# Patient Record
Sex: Male | Born: 1950 | Race: White | Hispanic: No | Marital: Married | Smoking: Never smoker
Health system: Southern US, Community
[De-identification: ages and names within clinical notes are randomized; demographics above are authoritative.]

## PROBLEM LIST (undated history)

## (undated) DIAGNOSIS — E785 Hyperlipidemia, unspecified: Secondary | ICD-10-CM

## (undated) DIAGNOSIS — I10 Essential (primary) hypertension: Secondary | ICD-10-CM

## (undated) DIAGNOSIS — C801 Malignant (primary) neoplasm, unspecified: Secondary | ICD-10-CM

## (undated) HISTORY — PX: HERNIA REPAIR: SHX51

## (undated) HISTORY — PX: CHOLECYSTECTOMY: SHX55

## (undated) HISTORY — PX: PROSTATE SURGERY: SHX751

## (undated) HISTORY — PX: APPENDECTOMY: SHX54

---

## 2008-02-25 ENCOUNTER — Emergency Department (HOSPITAL_COMMUNITY): Admission: EM | Admit: 2008-02-25 | Discharge: 2008-02-25 | Payer: Self-pay | Admitting: Emergency Medicine

## 2009-04-20 IMAGING — CT CT ANGIO CHEST
3 of 7 series · 17 of 46 positions shown · IV contrast (APPLIED)
Comparison: Plain film chest same date.

CHEST:

CLINICAL DATA: Chest pain.  Diaphoresis.  Evaluate for aortic
dissection.

]
CT ANGIOGRAPHY CHEST/ABDOMEN
TECHNIQUE: Multidetector CT imaging of the chest and abdomen were
performed using the standard protocol during bolus administration
of intravenous contrast. Precontrast images of the chest were
performed.  Multiplanar CT image reconstructions were obtained to
evaluate the vascular anatomy.
Contrast: 100 ml [id number]

[Series 4: without 5.0 st · axial · non-contrast · 0.66mm/px · z∈[+923,+1323]mm · 6 of 112 slices shown]
[im 16/112  lung]
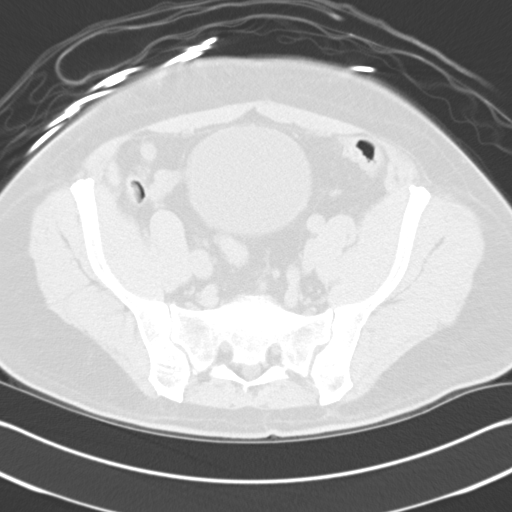
[im 32/112  soft-tissue]
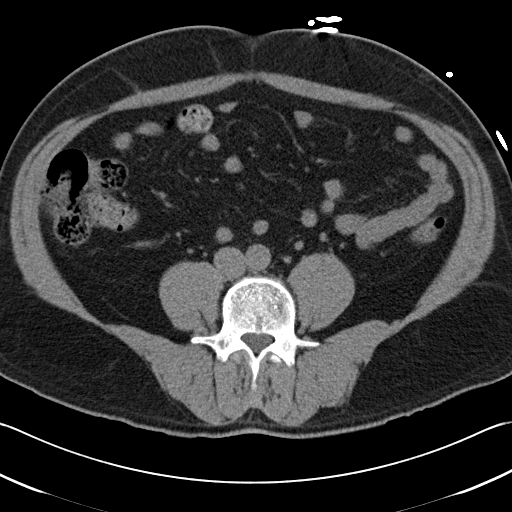
[im 48/112  lung]
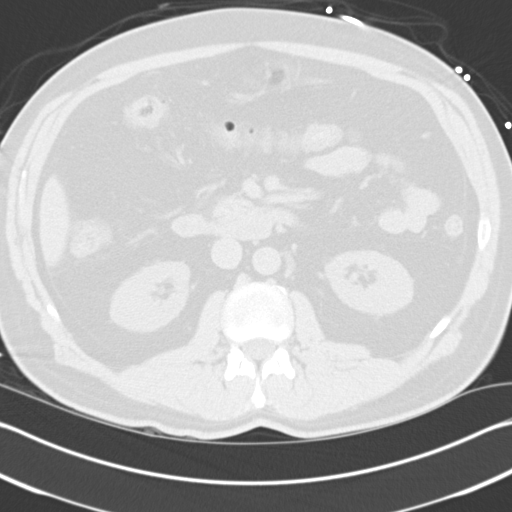
[im 64/112  soft-tissue]
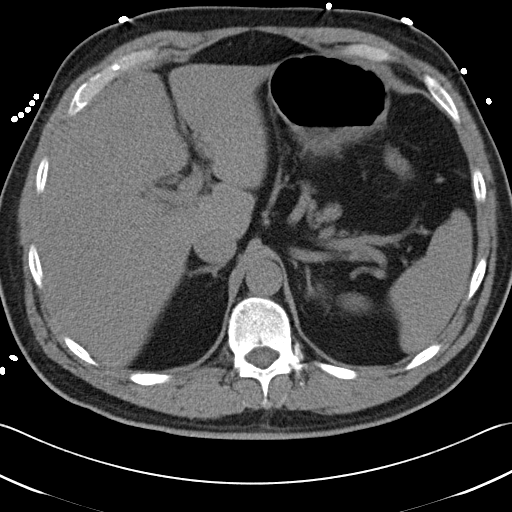
[im 80/112  lung]
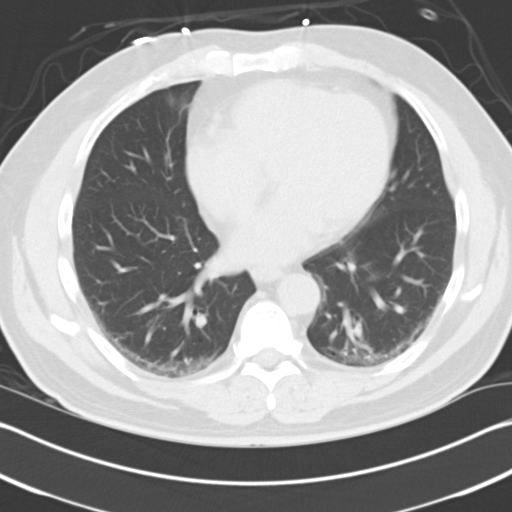
[im 96/112  soft-tissue]
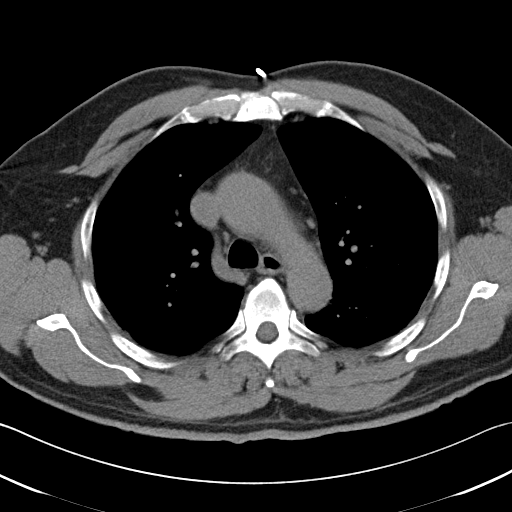

[Series 5: dissection 2.0 st · axial · 0.65mm/px · z∈[+924,+1352]mm · 8 of 246 slices shown]
[im 16/246  lung]
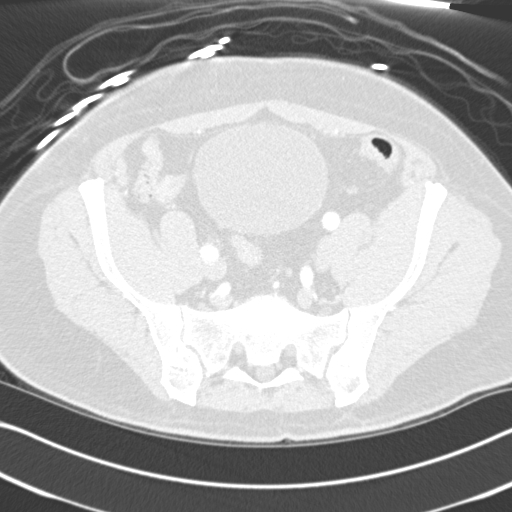
[im 46/246  lung]
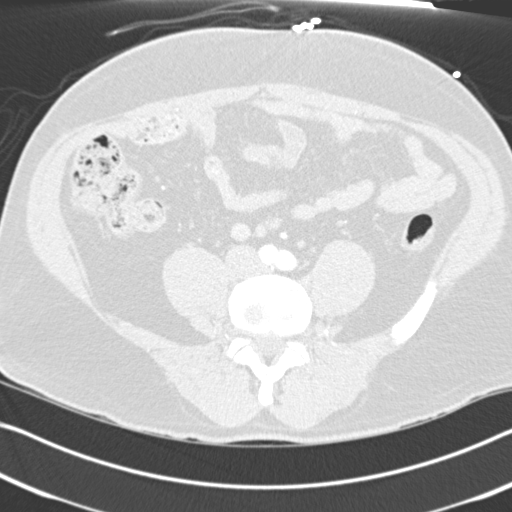
[im 77/246  lung]
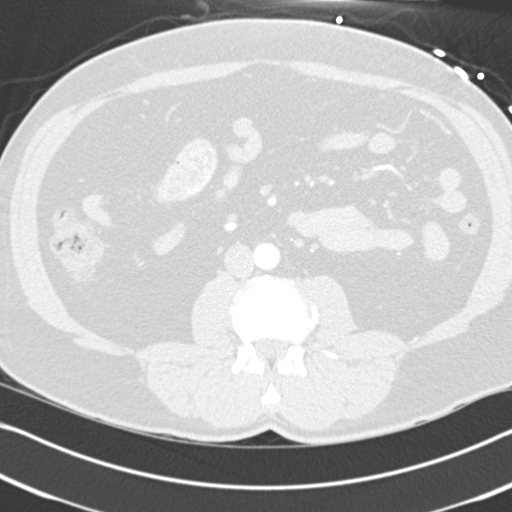
[im 108/246  lung]
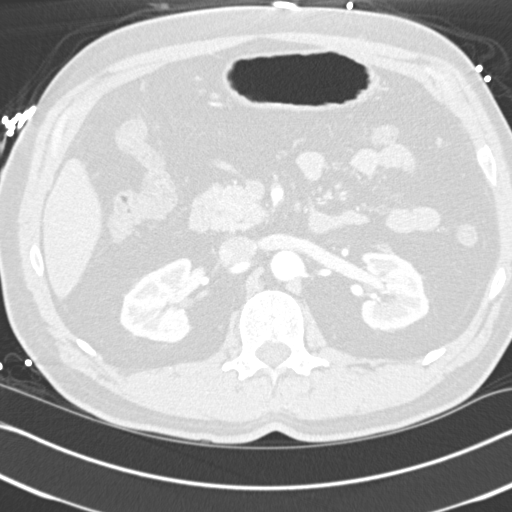
[im 138/246  lung]
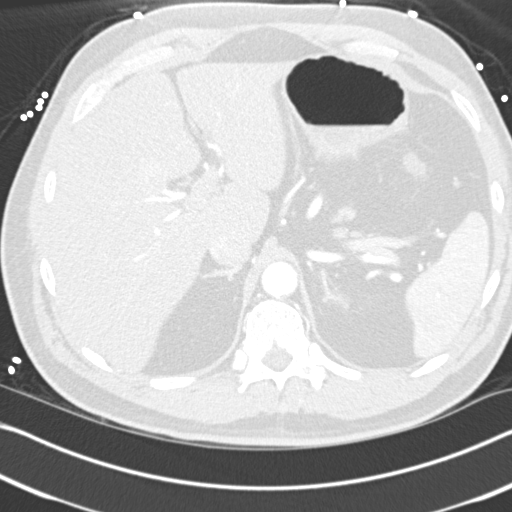
[im 169/246  lung]
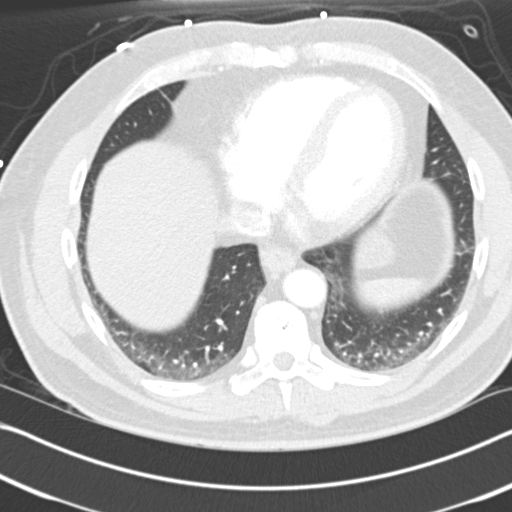
[im 200/246  lung]
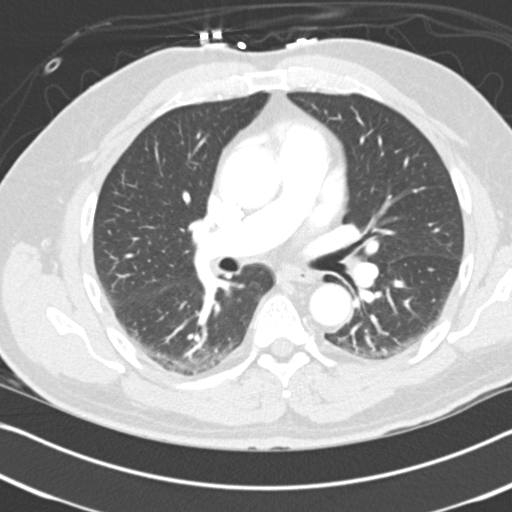
[im 230/246  lung]
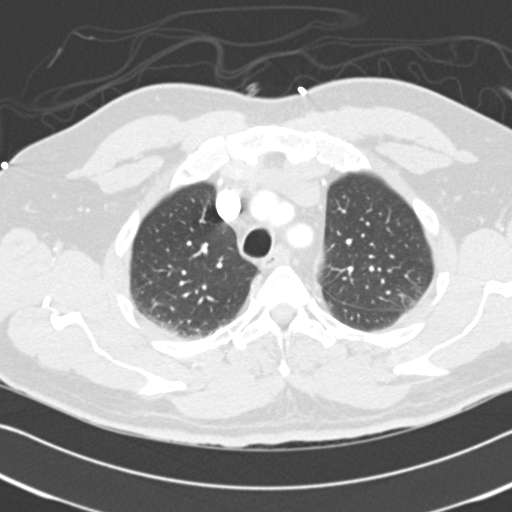

[Series 602: coronal dissection prot · coronal · 0.96mm/px · 3 of 121 slices shown]
[im 31/121  soft-tissue]
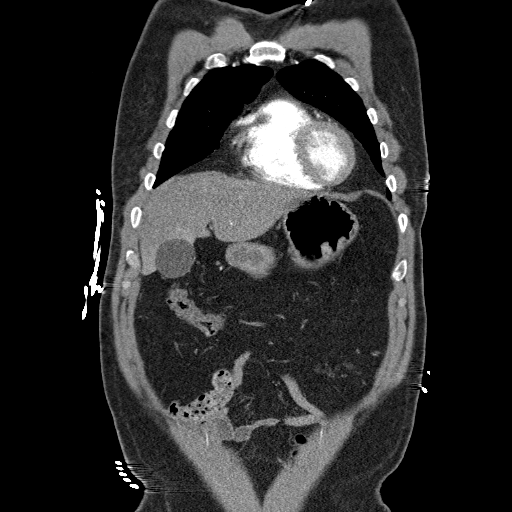
[im 61/121  soft-tissue]
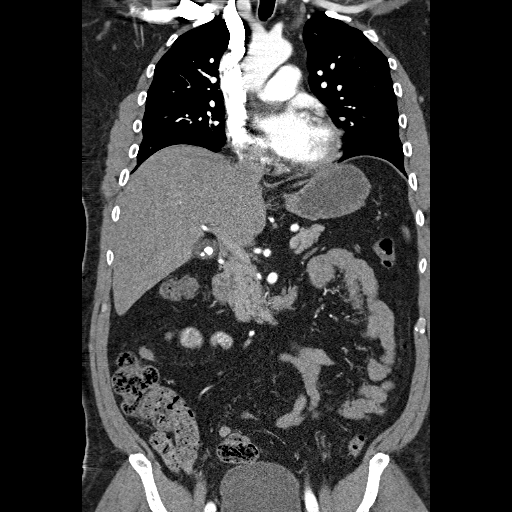
[im 91/121  soft-tissue]
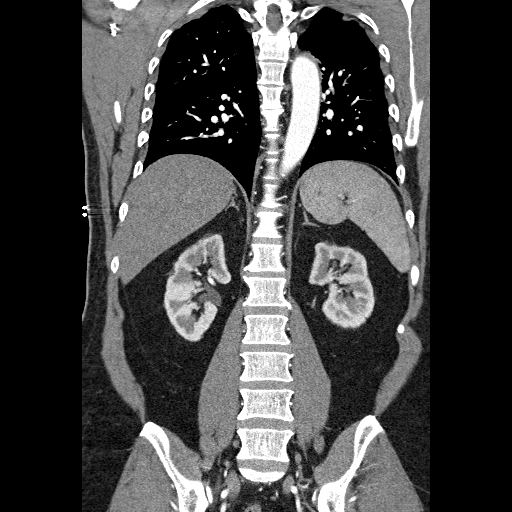

[17 of 46 positions shown; findings below may reference images not displayed]

FINDINGS: Lung windows demonstrate mild motion artifact .
Calcified nodules on the right minor fissure consistent with old
granulomatous disease.  Minimal atelectasis in the right middle
lobe.  Scar or atelectasis within the lingula.  Mild bibasilar
atelectasis.

Soft tissue windows demonstrate No evidence of intramural hematoma
on unenhanced imaging.

Normal aortic caliber without evidence of dissection.  Heart size
upper limits of normal. No pericardial or pleural effusion.  No
large pulmonary embolism on this non dedicated study. No
mediastinal or hilar adenopathy. Tiny hiatal hernia.

]
IMPRESSION: 1.  No evidence of aortic dissection or other explanation for acute
chest pain.
2.  Small hiatal hernia.

ABODMEN:
FINDINGS: [Mild fatty infiltration of the liver.  fat sparing
adjacent to the gallbladder.  Vague areas of hyperenhancement in
the right lobe the liver on images 95 and 106 of series 5.

Normal spleen, stomach, pancreas.  Cholelithiasis.  Largest stone
measures 9 mm.  No evidence of acute cholecystitis or biliary
ductal dilatation. Borderline gallbladder distension at 10 cm.

Normal adrenal glands.  Mildly scarred irregular kidneys
bilaterally.

Normal abdominal aortic caliber.  Widely patent celiac axis and
superior mesenteric artery.  multiple renal arteries bilaterally
all widely patent.  No evidence of aortic or proximal common iliac
dissection or aneurysm.

No retroperitoneal or retrocrural adenopathy. Normal colon and
terminal ileum.  Appendix is not visualized but there is no
evidence of right lower quadrant inflammation.

Normal abdominal small bowel without ascites. Probable right iliac
bone island....]
IMPRESSION: [.
1.  No evidence of aortic aneurysm or dissection.
2.  Cholelithiasis with borderline gallbladder distension.  If
cholecystitis is a concern, consider ultrasound.
3.  Multiple foci of arterial hepatic hyperenhancement.  Most
likely small perfusion anomalies.  If there is a history of liver
disease or primary malignancy, consider non emergent outpatient
abdominal MRI.
4.  Fatty infiltration of the liver..]

## 2010-11-21 NOTE — Consult Note (Signed)
NAMEHOWELL, GROESBECK NO.:  [id number]   MEDICAL RECORD NO.:  [id number]          PATIENT TYPE:  EMS   LOCATION:  [facility]                         FACILITY:  [facility]   PHYSICIAN:  Ardeth Sportsman, MD     DATE OF BIRTH:  [birth year]   DATE OF CONSULTATION:  02/25/2008  DATE OF DISCHARGE:  02/25/2008                                 CONSULTATION   TIME OF CONSULTATION:  1400 p.m.   REQUESTING PHYSICIAN:  Zadie Rhine, MD, in [facility] Emergency  Department.   GASTROENTEROLOGIST:  Dr. Dan Humphreys at [facility].   REASON FOR CONSULTATION:  Epigastric/chest pain with incidental findings  of a 1.2 cm gallstone on CT scan.   HISTORY OF PRESENT ILLNESS:  Mr. Rayos is a 60 year old white male with  a history of hypertension, GERD, and acid reflux who states that he woke  up this morning at around 0300 a.m. with a sharp, stabbing  chest/epigastric pain.  He states that he went and used the bathroom,  and the pain did not get any better.  Therefore, at that time, he took  his morning medications including his Prilosec.  This also was of no  help, and the patient tried to work through this, and therefore, went  onto work.  By the time the patient got to work, the pain was so bad  that 911 was called and the patient was brought here to [facility] Emergency  Room.  Once here, the patient was hypertensive, sweating, and appeared  in great distress.  At this time, an EKG, as well as cardiac enzymes and  a CT angio of the chest and abdomen were completed.  All these were  essentially negative.  However, at the time of the CT angio, there was  an incidental finding of a 1.2 cm gallstone.  Therefore, an ultrasound  of the abdomen and pelvis was done to further assess this.  The  abdominal ultrasound showed no gallbladder wall thickening or  pericholecystic fluid with a negative sonographic Murphy sign.  The only  other finding was 1.2 cm gallstone.  Because of this gallstone, we  were  called to see the patient.   All of the patient's labs are normal.  His pancreatic enzymes were  normal with a lipase of 29.  All of his LFTs were normal with a normal  CMP as well as a normal CBC with a normal white count of 8600.  Upon  evaluation, the patient does tell us that he has a history of acid  reflux.  He states that back in 2005, he had an endoscopy as well as  a  colonoscopy by Dr. Dan Humphreys with [facility].  After  further questioning, he did admit to the fact that while he was having  his endoscopy, apparently, he had some esophageal dilatation done at  that time as well.  Currently, the patient states that on occasions, he  feels that he gets food stuck; however, this has not happened very  often.  Otherwise, he states that his pain today is not associated at  all  with food.  There was no help with lying down or sitting up.  He did  not have any nausea or vomiting or diarrhea with this episode.  However,  he does state that this pain was sharp like a knife stabbing him in the  chest and did radiate towards his straight back to his spinal cord.  Otherwise, the patient currently denies any fevers, and his pain has  currently somewhat subsided since the time of admission to the ER.   REVIEW OF SYSTEMS:  Please see HPI.  Otherwise, all other systems are  essentially negative.   FAMILY HISTORY:  Noncontributory.   PAST MEDICAL HISTORY:  1. GERD.  2. Acid reflux.  3. Gout.  4. Hypercholesterolemia.  5. Hypertension.   PAST SURGICAL HISTORY:  There is no past surgical history.   SOCIAL HISTORY:  The patient is married.  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  Otherwise, he denies any use of alcohol,  tobacco, or any other drugs.   ALLERGIES:  No known drug allergies.   MEDICATIONS:  1. Aspirin 81 mg once a day.  2. Hydrochlorothiazide 25 mg once a day.  3. Indocin 50 mg as needed.  4. Levitra 10 mg as needed.  5. Metoprolol  tartrate 100 mg twice a day.  6. Multivitamins 1 tab once a day.  7. Prilosec 20 mg once a day.  8. TriCor 48 mg once a day.  9. Benicar 20 mg once a day.   PHYSICAL EXAM:  GENERAL:  Mr. Heinz is a 60 year old white male who is  lying in bed, currently in no acute distress and does not appear ill.  VITAL SIGNS:  Temperature 97.4, pulse 68, respirations 18, blood  pressure 156/90, however, has gotten as high as 181/109.  EYES:  Sclerae noninjected.  Pupils were equal, round, and reactive to  light.  EARS, NOSE, MOUTH, AND THROAT:  Ears and nose with no obvious masses or  lesions.  No rhinorrhea.  Mouth is pink and moist.  Throat shows no  exudate.  NECK:  Supple.  Trachea is midline.  No thyromegaly.  HEART:  Regular rate and rhythm.  Normal S1 and S2.  No murmurs,  gallops, or rubs are noted.  Positive carotid, radial, and pedal pulses  bilaterally.  LUNGS:  Clear to auscultation bilaterally with no wheezes, rhonchi, or  rales noted.  Respiratory effort is nonlabored.  Chest is symmetrical  with some tenderness to palpation just inferior to the xiphoid process.  ABDOMEN:  Soft with mild epigastric tenderness.  The patient does have  active bowel sounds.  Otherwise, he is nondistended with no other  obvious masses or hernias.  The patient is only tender in his epigastric  region and has no tenderness at all in his right upper quadrant or his  left upper quadrant.  He also has a negative Murphy sign.  GU:  Per Dr. Michaell Cowing' evaluation, the patient has a subtle left inguinal  hernia.  MUSCULOSKELETAL:  All 4 extremities are symmetrical with no cyanosis,  clubbing, or edema.  SKIN:  Warm and dry with no obvious masses, lesions, or rashes.  NEUROLOGIC:  The patient's cranial nerves II through XII appeared to be  grossly intact.  Deep tendon reflex exam was deferred at this time.  PSYCH:  The patient is alert and oriented x3 with somewhat slow sense of  speech.  However, he does have a  normal affect.   LABS AND DIAGNOSTICS:  Sodium 138, potassium 4.7, BUN 20, creatinine  1.42, glucose 153.  WBCs 8600, hemoglobin 16.3, hematocrit 48.0, and  platelets were 198,000.  CT angio of the chest and abdomen was negative  except for an incidental finding of a 1.2 cm gallstone.  Abdominal  ultrasound was also negative except for once again this 1.2 cm  gallstone.  Otherwise, he had no gallbladder wall thickening.  No  pericholecystic fluid, and no common bile duct dilations or  abnormalities.   IMPRESSION:  1. Epigastric/chest pain, etiology is unclear.  2. Gastroesophageal reflux disease.  3. Acid reflux.  4. Hypertension.   PLAN:  At this time, the patient does not seem to have traditional  gallbladder type symptoms.  He is not complaining of pain after eating,  any nausea or vomiting, or even any right upper quadrant abdominal pain.  This is somewhat unclear presentation, however, due to his history of  acid reflux and GERD, as well as a prior esophageal dilatation and  possible gastritis seen on EGD done by Dr. Dan Humphreys in 2005, we feel at  this time, the patient probably needs to follow up again with Dr. Dan Humphreys  or a GI specialist of his choice to once again be scoped from above.  Otherwise, in the meantime, we have informed the patient to go ahead and  double up on his Prilosec and take 20 mg twice a day.   We have also talked to the patient to let him know that if for some  reason he continues to have problems or after further workup by GI  specialist is done, if he does need a Careers adviser, to feel free to call  [facility] and see Dr. Michaell Cowing in followup.  Otherwise,  there is a further recommendation if EGD comes back negative that it is  possible the patient may need a HIDA scan with [facility] to further evaluate  the gallbladder as well.  Otherwise, at this time, Dr. Michaell Cowing and I have  discussed this case with the ER physician and feel that the patient is  stable  for discharge from the emergency room with followup with a GI  doctor.  Also, we have informed the patient if for some reasons his left  inguinal hernia ever begins to cause pain or any significant problems,  that he once again may follow up with Dr. Michaell Cowing for this to be fixed at  a later date.      Letha Cape, PA      Ardeth Sportsman, MD  Electronically Signed    KEO/MEDQ  D:  02/25/2008  T:  02/26/2008  Job:  [id number]   cc:   Dr. Dan Humphreys

## 2011-03-22 ENCOUNTER — Encounter (INDEPENDENT_AMBULATORY_CARE_PROVIDER_SITE_OTHER): Payer: 59 | Admitting: Ophthalmology

## 2011-03-22 DIAGNOSIS — D3130 Benign neoplasm of unspecified choroid: Secondary | ICD-10-CM

## 2011-03-22 DIAGNOSIS — H35039 Hypertensive retinopathy, unspecified eye: Secondary | ICD-10-CM

## 2011-03-22 DIAGNOSIS — H2510 Age-related nuclear cataract, unspecified eye: Secondary | ICD-10-CM

## 2011-03-22 DIAGNOSIS — H43819 Vitreous degeneration, unspecified eye: Secondary | ICD-10-CM

## 2012-03-21 ENCOUNTER — Encounter (INDEPENDENT_AMBULATORY_CARE_PROVIDER_SITE_OTHER): Payer: 59 | Admitting: Ophthalmology

## 2012-03-21 DIAGNOSIS — H43819 Vitreous degeneration, unspecified eye: Secondary | ICD-10-CM

## 2012-03-21 DIAGNOSIS — D3130 Benign neoplasm of unspecified choroid: Secondary | ICD-10-CM

## 2012-03-21 DIAGNOSIS — I10 Essential (primary) hypertension: Secondary | ICD-10-CM

## 2012-03-21 DIAGNOSIS — H35039 Hypertensive retinopathy, unspecified eye: Secondary | ICD-10-CM

## 2012-03-21 DIAGNOSIS — H2510 Age-related nuclear cataract, unspecified eye: Secondary | ICD-10-CM

## 2013-03-27 ENCOUNTER — Ambulatory Visit (INDEPENDENT_AMBULATORY_CARE_PROVIDER_SITE_OTHER): Payer: 59 | Admitting: Ophthalmology

## 2013-04-03 ENCOUNTER — Ambulatory Visit (INDEPENDENT_AMBULATORY_CARE_PROVIDER_SITE_OTHER): Payer: TRICARE For Life (TFL) | Admitting: Ophthalmology

## 2013-04-03 DIAGNOSIS — I10 Essential (primary) hypertension: Secondary | ICD-10-CM

## 2013-04-03 DIAGNOSIS — H2510 Age-related nuclear cataract, unspecified eye: Secondary | ICD-10-CM

## 2013-04-03 DIAGNOSIS — H43819 Vitreous degeneration, unspecified eye: Secondary | ICD-10-CM

## 2013-04-03 DIAGNOSIS — H35039 Hypertensive retinopathy, unspecified eye: Secondary | ICD-10-CM

## 2013-04-03 DIAGNOSIS — D3130 Benign neoplasm of unspecified choroid: Secondary | ICD-10-CM

## 2014-04-08 ENCOUNTER — Ambulatory Visit (INDEPENDENT_AMBULATORY_CARE_PROVIDER_SITE_OTHER): Payer: 59 | Admitting: Ophthalmology

## 2014-04-08 DIAGNOSIS — D3132 Benign neoplasm of left choroid: Secondary | ICD-10-CM

## 2014-04-08 DIAGNOSIS — H43813 Vitreous degeneration, bilateral: Secondary | ICD-10-CM

## 2014-04-08 DIAGNOSIS — I10 Essential (primary) hypertension: Secondary | ICD-10-CM

## 2014-04-08 DIAGNOSIS — H2513 Age-related nuclear cataract, bilateral: Secondary | ICD-10-CM

## 2014-04-08 DIAGNOSIS — H35033 Hypertensive retinopathy, bilateral: Secondary | ICD-10-CM

## 2014-06-24 ENCOUNTER — Emergency Department (INDEPENDENT_AMBULATORY_CARE_PROVIDER_SITE_OTHER)
Admission: EM | Admit: 2014-06-24 | Discharge: 2014-06-24 | Disposition: A | Discharge status: Home / Self Care | Payer: 59 | Source: Home / Self Care | Attending: Family Medicine | Admitting: Family Medicine

## 2014-06-24 ENCOUNTER — Encounter (HOSPITAL_COMMUNITY): Payer: Self-pay | Admitting: Emergency Medicine

## 2014-06-24 DIAGNOSIS — H01006 Unspecified blepharitis left eye, unspecified eyelid: Secondary | ICD-10-CM

## 2014-06-24 HISTORY — DX: Hyperlipidemia, unspecified: E78.5

## 2014-06-24 HISTORY — DX: Malignant (primary) neoplasm, unspecified: C80.1

## 2014-06-24 HISTORY — DX: Essential (primary) hypertension: I10

## 2014-06-24 MED ORDER — ERYTHROMYCIN 5 MG/GM OP OINT: 1.0000 "application " | TOPICAL_OINTMENT | Freq: Every day | OPHTHALMIC | Status: DC

## 2014-06-24 NOTE — ED Provider Notes (Signed)
CSN: [id number]     Arrival date & time 06/24/14  1008 History   First MD Initiated Contact with Patient 06/24/14 1059     Chief Complaint  Patient presents with  . Conjunctivitis   (Consider location/radiation/quality/duration/timing/severity/associated sxs/prior Treatment) HPI  L eyelid swelling and eye irritation: started yesterday. Getting worse. Film over eye will affect vision. Denies recent cold symptoms. Denies fevers, lossof vision, HA, rash, n/v, CP, SOB. Has not tried anything. Does not wear contacts. 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    Past Medical History  Diagnosis Date  . Hypertension   . Cancer     prostate  . HLD (hyperlipidemia)    Past Surgical History  Procedure Laterality Date  . Appendectomy    . Hernia repair    . Cholecystectomy    . Prostate surgery      prostate cancer 2014   History reviewed. No pertinent family history. History  Substance Use Topics  . Smoking status: Never Smoker   . Smokeless tobacco: Not on file  . Alcohol Use: No    Review of Systems Per HPI with all other pertinent systems negative.   Allergies  Review of patient's allergies indicates no known allergies.  Home Medications   Prior to Admission medications   Medication Sig Start Date End Date Taking? Authorizing Provider  amLODipine (NORVASC) 10 MG tablet Take 10 mg by mouth daily.   Yes Historical Provider, MD  fenofibrate (TRICOR) 48 MG tablet Take 48 mg by mouth daily.   Yes Historical Provider, MD  finasteride (PROSCAR) 5 MG tablet Take 5 mg by mouth daily.   Yes Historical Provider, MD  metoprolol succinate (TOPROL-XL) 50 MG 24 hr tablet Take 50 mg by mouth daily. Take with or immediately following a meal.   Yes Historical Provider, MD  olmesartan-hydrochlorothiazide (BENICAR HCT) 40-25 MG per tablet Take 1 tablet by mouth daily.   Yes Historical Provider, MD  omeprazole (PRILOSEC) 20 MG capsule Take 20 mg by mouth daily.   Yes Historical  Provider, MD  pravastatin (PRAVACHOL) 20 MG tablet Take 20 mg by mouth daily.   Yes Historical Provider, MD  tadalafil (CIALIS) 5 MG tablet Take 5 mg by mouth daily as needed for erectile dysfunction.   Yes Historical Provider, MD  tamsulosin (FLOMAX) 0.4 MG CAPS capsule Take 0.4 mg by mouth.   Yes Historical Provider, MD  erythromycin ophthalmic ointment Place 1 application into the left eye at bedtime. Treat for 5 days 06/24/14   Waldemar Dickens, MD  sildenafil (VIAGRA) 100 MG tablet Take 100 mg by mouth daily as needed for erectile dysfunction.    Historical Provider, MD   BP 138/69 mmHg  Pulse 69  Temp(Src) 98.3 F (36.8 C) (Oral)  Resp 12  SpO2 98% Physical Exam  Constitutional: He is oriented to person, place, and time. He appears well-developed and well-nourished.  HENT:  Head: Normocephalic and atraumatic.  L superior eyelid w/ erythema but no mass/lesion. Conjunctival injection and scant dried discharge.   Eyes: EOM are normal. Pupils are equal, round, and reactive to light.  Neck: Normal range of motion.  Cardiovascular:  No murmur heard. Pulmonary/Chest: Effort normal. No respiratory distress.  Abdominal: Bowel sounds are normal.  Musculoskeletal: Normal range of motion. He exhibits no edema or tenderness.  Neurological: He is alert and oriented to person, place, and time.  Skin: Skin is warm.  Psychiatric: He has a normal mood and affect. His behavior is normal. Judgment  and thought content normal.    ED Course  Procedures (including critical care time) Labs Review Labs Reviewed - No data to display  Imaging Review No results found.   MDM   1. Blepharitis, left    Warm soapy water Warm compresses Erythromycin ointment if not improving Precautions given and all questions answered  Linna Darner, MD Family Medicine 06/24/2014, 11:18 AM      Waldemar Dickens, MD 06/24/14 [phone]

## 2014-06-24 NOTE — Discharge Instructions (Signed)
You likely have blepharitis or inflammation of the upper eyelid This is likely caused by a bacterial infection and may clear with daily soapy washes and warm compresses. Please start the erythromycin if this doesn't improve in another 24 hours   Blepharitis Blepharitis is redness, soreness, and swelling (inflammation) of one or both eyelids. It may be caused by an allergic reaction or a bacterial infection. Blepharitis may also be associated with reddened, scaly skin (seborrhea) of the scalp and eyebrows. While you sleep, eye discharge may cause your eyelashes to stick together. Your eyelids may itch, burn, swell, and may lose their lashes. These will grow back. Your eyes may become sensitive. Blepharitis may recur and need repeated treatment. If this is the case, you may require further evaluation by an eye specialist (ophthalmologist). HOME CARE INSTRUCTIONS   Keep your hands clean.  Use a clean towel each time you dry your eyelids. Do not use this towel to clean other areas. Do not share a towel or makeup with anyone.  Wash your eyelids with warm water or warm water mixed with a small amount of baby shampoo. Do this twice a day or as often as needed.  Wash your face and eyebrows at least once a day.  Use warm compresses 2 times a day for 10 minutes at a time, or as directed by your caregiver.  Apply antibiotic ointment as directed by your caregiver.  Avoid rubbing your eyes.  Avoid wearing makeup until you get better.  Follow up with your caregiver as directed. SEEK IMMEDIATE MEDICAL CARE IF:   You have pain, redness, or swelling that gets worse or spreads to other parts of your face.  Your vision changes, or you have pain when looking at lights or moving objects.  You have a fever.  Your symptoms continue for longer than 2 to 4 days or become worse. MAKE SURE YOU:   Understand these instructions.  Will watch your condition.  Will get help right away if you are not doing  well or get worse. Document Released: 06/22/2000 Document Revised: 09/17/2011 Document Reviewed: 08/02/2010 [facility] Patient Information 2015 [place], [place]. This information is not intended to replace advice given to you by your health care provider. Make sure you discuss any questions you have with your health care provider.

## 2014-06-24 NOTE — ED Notes (Signed)
C/o pain and irritation, mild swelling of the left eye since yesterday.  Denies fever, v/n/d

## 2015-04-12 ENCOUNTER — Ambulatory Visit (INDEPENDENT_AMBULATORY_CARE_PROVIDER_SITE_OTHER): Payer: Medicare Other | Admitting: Ophthalmology

## 2015-04-12 DIAGNOSIS — I10 Essential (primary) hypertension: Secondary | ICD-10-CM

## 2015-04-12 DIAGNOSIS — H35033 Hypertensive retinopathy, bilateral: Secondary | ICD-10-CM | POA: Diagnosis not present

## 2015-04-12 DIAGNOSIS — H2513 Age-related nuclear cataract, bilateral: Secondary | ICD-10-CM

## 2015-04-12 DIAGNOSIS — H43813 Vitreous degeneration, bilateral: Secondary | ICD-10-CM | POA: Diagnosis not present

## 2015-04-12 DIAGNOSIS — D3132 Benign neoplasm of left choroid: Secondary | ICD-10-CM | POA: Diagnosis not present

## 2016-04-11 ENCOUNTER — Ambulatory Visit (INDEPENDENT_AMBULATORY_CARE_PROVIDER_SITE_OTHER): Payer: Medicare Other | Admitting: Ophthalmology

## 2016-04-11 DIAGNOSIS — H43813 Vitreous degeneration, bilateral: Secondary | ICD-10-CM | POA: Diagnosis not present

## 2016-04-11 DIAGNOSIS — D3132 Benign neoplasm of left choroid: Secondary | ICD-10-CM

## 2016-04-11 DIAGNOSIS — I10 Essential (primary) hypertension: Secondary | ICD-10-CM

## 2016-04-11 DIAGNOSIS — H35033 Hypertensive retinopathy, bilateral: Secondary | ICD-10-CM | POA: Diagnosis not present

## 2017-04-12 ENCOUNTER — Ambulatory Visit (INDEPENDENT_AMBULATORY_CARE_PROVIDER_SITE_OTHER): Payer: Medicare Other | Admitting: Ophthalmology

## 2017-04-12 DIAGNOSIS — D3132 Benign neoplasm of left choroid: Secondary | ICD-10-CM

## 2017-04-12 DIAGNOSIS — H2513 Age-related nuclear cataract, bilateral: Secondary | ICD-10-CM

## 2017-04-12 DIAGNOSIS — H43813 Vitreous degeneration, bilateral: Secondary | ICD-10-CM

## 2017-04-12 DIAGNOSIS — I10 Essential (primary) hypertension: Secondary | ICD-10-CM

## 2017-04-12 DIAGNOSIS — H35033 Hypertensive retinopathy, bilateral: Secondary | ICD-10-CM

## 2017-04-12 DIAGNOSIS — E113292 Type 2 diabetes mellitus with mild nonproliferative diabetic retinopathy without macular edema, left eye: Secondary | ICD-10-CM | POA: Diagnosis not present

## 2017-04-12 DIAGNOSIS — E11319 Type 2 diabetes mellitus with unspecified diabetic retinopathy without macular edema: Secondary | ICD-10-CM | POA: Diagnosis not present

## 2017-07-07 ENCOUNTER — Other Ambulatory Visit: Payer: Self-pay | Admitting: Family Medicine

## 2018-04-14 ENCOUNTER — Encounter (INDEPENDENT_AMBULATORY_CARE_PROVIDER_SITE_OTHER): Payer: Medicare Other | Admitting: Ophthalmology

## 2018-04-14 DIAGNOSIS — H43813 Vitreous degeneration, bilateral: Secondary | ICD-10-CM

## 2018-04-14 DIAGNOSIS — I10 Essential (primary) hypertension: Secondary | ICD-10-CM | POA: Diagnosis not present

## 2018-04-14 DIAGNOSIS — E11319 Type 2 diabetes mellitus with unspecified diabetic retinopathy without macular edema: Secondary | ICD-10-CM | POA: Diagnosis not present

## 2018-04-14 DIAGNOSIS — D3132 Benign neoplasm of left choroid: Secondary | ICD-10-CM | POA: Diagnosis not present

## 2018-04-14 DIAGNOSIS — H2513 Age-related nuclear cataract, bilateral: Secondary | ICD-10-CM

## 2018-04-14 DIAGNOSIS — H35033 Hypertensive retinopathy, bilateral: Secondary | ICD-10-CM | POA: Diagnosis not present

## 2018-04-14 DIAGNOSIS — E113292 Type 2 diabetes mellitus with mild nonproliferative diabetic retinopathy without macular edema, left eye: Secondary | ICD-10-CM | POA: Diagnosis not present

## 2019-04-15 ENCOUNTER — Encounter (INDEPENDENT_AMBULATORY_CARE_PROVIDER_SITE_OTHER): Payer: Medicare Other | Admitting: Ophthalmology

## 2019-04-15 DIAGNOSIS — I10 Essential (primary) hypertension: Secondary | ICD-10-CM

## 2019-04-15 DIAGNOSIS — H34832 Tributary (branch) retinal vein occlusion, left eye, with macular edema: Secondary | ICD-10-CM | POA: Diagnosis not present

## 2019-04-15 DIAGNOSIS — E113291 Type 2 diabetes mellitus with mild nonproliferative diabetic retinopathy without macular edema, right eye: Secondary | ICD-10-CM | POA: Diagnosis not present

## 2019-04-15 DIAGNOSIS — H43813 Vitreous degeneration, bilateral: Secondary | ICD-10-CM

## 2019-04-15 DIAGNOSIS — E11319 Type 2 diabetes mellitus with unspecified diabetic retinopathy without macular edema: Secondary | ICD-10-CM | POA: Diagnosis not present

## 2019-04-15 DIAGNOSIS — E113392 Type 2 diabetes mellitus with moderate nonproliferative diabetic retinopathy without macular edema, left eye: Secondary | ICD-10-CM

## 2019-04-15 DIAGNOSIS — H35033 Hypertensive retinopathy, bilateral: Secondary | ICD-10-CM

## 2020-12-23 ENCOUNTER — Other Ambulatory Visit: Payer: Self-pay

## 2020-12-23 ENCOUNTER — Ambulatory Visit (HOSPITAL_COMMUNITY)
Admission: EM | Admit: 2020-12-23 | Discharge: 2020-12-23 | Disposition: A | Discharge status: Home / Self Care | Payer: Medicare Other | Attending: Family | Admitting: Family

## 2020-12-23 ENCOUNTER — Encounter (HOSPITAL_COMMUNITY): Payer: Self-pay

## 2020-12-23 DIAGNOSIS — K219 Gastro-esophageal reflux disease without esophagitis: Secondary | ICD-10-CM | POA: Diagnosis not present

## 2020-12-23 DIAGNOSIS — I10 Essential (primary) hypertension: Secondary | ICD-10-CM | POA: Diagnosis not present

## 2020-12-23 DIAGNOSIS — R059 Cough, unspecified: Secondary | ICD-10-CM | POA: Diagnosis not present

## 2020-12-23 DIAGNOSIS — E785 Hyperlipidemia, unspecified: Secondary | ICD-10-CM | POA: Insufficient documentation

## 2020-12-23 DIAGNOSIS — Z79899 Other long term (current) drug therapy: Secondary | ICD-10-CM | POA: Insufficient documentation

## 2020-12-23 DIAGNOSIS — U071 COVID-19: Secondary | ICD-10-CM | POA: Insufficient documentation

## 2020-12-23 DIAGNOSIS — R509 Fever, unspecified: Secondary | ICD-10-CM

## 2020-12-23 DIAGNOSIS — Z8546 Personal history of malignant neoplasm of prostate: Secondary | ICD-10-CM | POA: Diagnosis not present

## 2020-12-23 DIAGNOSIS — R051 Acute cough: Secondary | ICD-10-CM | POA: Diagnosis present

## 2020-12-23 LAB — SARS CORONAVIRUS 2 (TAT 6-24 HRS): SARS Coronavirus 2: POSITIVE — AB

## 2020-12-23 LAB — POC INFLUENZA A AND B ANTIGEN (URGENT CARE ONLY)
INFLUENZA A ANTIGEN, POC: NEGATIVE
INFLUENZA B ANTIGEN, POC: NEGATIVE

## 2020-12-23 MED ORDER — BENZONATATE 100 MG PO CAPS: 100.0000 mg | ORAL_CAPSULE | Freq: Three times a day (TID) | ORAL | 0 refills | Status: DC | PRN

## 2020-12-23 MED ORDER — ACETAMINOPHEN 325 MG PO TABS
650.0000 mg | ORAL_TABLET | Freq: Once | ORAL | Status: AC
  Administered 2020-12-23: 650 mg via ORAL

## 2020-12-23 MED ORDER — ACETAMINOPHEN 325 MG PO TABS
ORAL_TABLET | ORAL | Status: AC
  Filled 2020-12-23: qty 2

## 2020-12-23 NOTE — ED Provider Notes (Signed)
[place]    CSN: [id number] Arrival date & time: 12/23/20  0957      History   Chief Complaint Chief Complaint  Patient presents with   Cough    HPI Jerome Cardenas is a 70 y.o. male.   70 year old male presents with dry cough and fever and chills that started yesterday. Denies any nasal congestion, sore throat or GI symptoms. Mom also sick with dry cough for the past 3 days- recently hospitalized before she was sick. Took home COVID 19 test yesterday that was negative. Has had COVID 19 vaccine and booster. Has not taken any medication for symptoms. Other chronic health issues include HTN, hyperlipidemia, GERD, and history of prostate cancer. Currently on Norvasc, Toprol XL, Benicar HCT, Tricor, Pravachol, Prilosec, Flomax, and Proscar daily.   The history is provided by the patient.   Past Medical History:  Diagnosis Date   Cancer [facility])    prostate   HLD (hyperlipidemia)    Hypertension     There are no problems to display for this patient.   Past Surgical History:  Procedure Laterality Date   APPENDECTOMY     CHOLECYSTECTOMY     HERNIA REPAIR     PROSTATE SURGERY     prostate cancer 2014       Home Medications    Prior to Admission medications   Medication Sig Start Date End Date Taking? Authorizing Provider  amLODipine (NORVASC) 5 MG tablet Take 1 tablet by mouth daily. 12/24/16  Yes [provider]  benzonatate (TESSALON) 100 MG capsule Take 1 capsule (100 mg total) by mouth 3 (three) times daily as needed for cough. 12/23/20  Yes Maricela Schreur, Nicholes Stairs, NP  fenofibrate (TRICOR) 48 MG tablet Take by mouth. 10/04/16  Yes [provider]  finasteride (PROSCAR) 5 MG tablet finasteride 5 mg tablet 04/27/15  Yes [provider]  amLODipine (NORVASC) 10 MG tablet Take 10 mg by mouth daily.    [provider]  Cholecalciferol 25 MCG (1000 UT) capsule Take by mouth.    [provider]  finasteride (PROSCAR) 5 MG  tablet Take 5 mg by mouth daily.    [provider]  metoprolol succinate (TOPROL-XL) 50 MG 24 hr tablet Take 50 mg by mouth daily. Take with or immediately following a meal.    [provider]  olmesartan-hydrochlorothiazide (BENICAR HCT) 40-25 MG per tablet Take 1 tablet by mouth daily.    [provider]  omeprazole (PRILOSEC) 20 MG capsule Take 20 mg by mouth daily.    [provider]  pravastatin (PRAVACHOL) 20 MG tablet Take 20 mg by mouth daily.    [provider]  sildenafil (VIAGRA) 100 MG tablet Take 100 mg by mouth daily as needed for erectile dysfunction.    [provider]  tadalafil (CIALIS) 5 MG tablet Take 5 mg by mouth daily as needed for erectile dysfunction.    [provider]  tamsulosin (FLOMAX) 0.4 MG CAPS capsule Take 0.4 mg by mouth.    [provider]    Family History History reviewed. No pertinent family history.  Social History Social History   Tobacco Use   Smoking status: Never   Smokeless tobacco: Never  Substance Use Topics   Alcohol use: No   Drug use: No     Allergies   Patient has no known allergies.   Review of Systems Review of Systems  Constitutional:  Positive for chills, fatigue and fever. Negative  for activity change and appetite change.  HENT:  Negative for congestion, ear discharge, ear pain, facial swelling, mouth sores, nosebleeds, postnasal drip, rhinorrhea, sinus pressure, sinus pain, sneezing, sore throat and trouble swallowing.   Eyes:  Negative for pain, discharge, redness and itching.  Respiratory:  Positive for cough. Negative for chest tightness, shortness of breath and wheezing.   Cardiovascular:  Negative for chest pain.  Gastrointestinal:  Negative for diarrhea, nausea and vomiting.  Musculoskeletal:  Positive for arthralgias and myalgias. Negative for neck pain and neck stiffness.  Skin:  Negative for color change and rash.  Allergic/Immunologic:  Negative for environmental allergies, food allergies and immunocompromised state.  Neurological:  Negative for dizziness, tremors, seizures, syncope, speech difficulty, weakness, light-headedness, numbness and headaches.  Hematological:  Negative for adenopathy. Does not bruise/bleed easily.    Physical Exam Triage Vital Signs ED Triage Vitals  Enc Vitals Group     BP 12/23/20 1051 (!) 169/82     Pulse Rate 12/23/20 1049 99     Resp 12/23/20 1049 16     Temp 12/23/20 1049 (!) 101.6 F (38.7 C)     Temp Source 12/23/20 1049 Oral     SpO2 12/23/20 1049 97 %     Weight --      Height --      Head Circumference --      Peak Flow --      Pain Score 12/23/20 1047 0     Pain Loc --      Pain Edu? --      Excl. in [place]? --    No data found.  Updated Vital Signs BP (!) 169/82 (BP Location: Right Arm)   Pulse 99   Temp (!) 101.6 F (38.7 C) (Oral)   Resp 16   SpO2 97%   Visual Acuity Right Eye Distance:   Left Eye Distance:   Bilateral Distance:    Right Eye Near:   Left Eye Near:    Bilateral Near:     Physical Exam Vitals and nursing note reviewed.  Constitutional:      General: He is awake. He is not in acute distress.    Appearance: He is well-developed and well-groomed. He is ill-appearing.     Comments: He is sitting on the exam table in no acute distress but appears tired and ill.   HENT:     Head: Normocephalic and atraumatic.     Right Ear: Hearing, tympanic membrane, ear canal and external ear normal.     Left Ear: Hearing, tympanic membrane, ear canal and external ear normal.     Nose: Nose normal. No congestion or rhinorrhea.     Right Sinus: No maxillary sinus tenderness or frontal sinus tenderness.     Left Sinus: No maxillary sinus tenderness or frontal sinus tenderness.     Mouth/Throat:     Lips: Pink.     Mouth: Mucous membranes are moist.     Pharynx: Oropharynx is clear. Uvula midline. No pharyngeal swelling, oropharyngeal exudate, posterior  oropharyngeal erythema or uvula swelling.  Eyes:     Extraocular Movements: Extraocular movements intact.     Conjunctiva/sclera: Conjunctivae normal.  Cardiovascular:     Rate and Rhythm: Regular rhythm. Tachycardia present.     Heart sounds: Normal heart sounds. No murmur heard. Pulmonary:     Effort: Pulmonary effort is normal. No prolonged expiration or respiratory distress.     Breath sounds: Normal breath sounds and air entry. No decreased air  movement. No decreased breath sounds, wheezing, rhonchi or rales.  Musculoskeletal:        General: Normal range of motion.     Cervical back: Normal range of motion and neck supple.  Lymphadenopathy:     Cervical: No cervical adenopathy.  Skin:    General: Skin is warm and dry.     Capillary Refill: Capillary refill takes less than 2 seconds.     Findings: No rash.  Neurological:     General: No focal deficit present.     Mental Status: He is alert and oriented to person, place, and time.  Psychiatric:        Mood and Affect: Mood normal.        Behavior: Behavior normal. Behavior is cooperative.        Thought Content: Thought content normal.        Judgment: Judgment normal.     UC Treatments / Results  Labs (all labs ordered are listed, but only abnormal results are displayed) Labs Reviewed  SARS CORONAVIRUS 2 (TAT 6-24 HRS)  POC INFLUENZA A AND B ANTIGEN (URGENT CARE ONLY)    EKG   Radiology No results found.  Procedures Procedures (including critical care time)  Medications Ordered in UC Medications  acetaminophen (TYLENOL) tablet 650 mg (650 mg Oral Given 12/23/20 1054)    Initial Impression / Assessment and Plan / UC Course  I have reviewed the triage vital signs and the nursing notes.  Pertinent labs & imaging results that were available during my care of the patient were reviewed by me and considered in my medical decision making (see chart for details).     Gave Tylenol 650mg  now to help with fever.  Reviewed negative rapid flu test results with patient. Discussed that he probably has a viral illness, may be COVID 19.  Do not feel imaging is needed at this time. Recommend start Tessalon cough pills 1 every 8 hours as needed. Continue to push fluids to stay well hydrated. May continue Tylenol 1000mg  every 8 hours as needed for fever. Blood pressure may be elevated due to illness. Continue to monitor. Rest. Stay at home. Follow-up pending COVID 19 test results.    Final Clinical Impressions(s) / UC Diagnoses   Final diagnoses:  Cough  Fever, unspecified  Elevated blood pressure reading in office with diagnosis of hypertension     Discharge Instructions      Recommend start Tessalon cough pills 1 every 8 hours as needed. Continue to push fluids to stay well hydrated. May take Tylenol 1000mg  every 8 hours as needed for fever. Rest. Stay at home. Follow-up pending COVID 19 test results.      ED Prescriptions     Medication Sig Dispense Auth. Provider   benzonatate (TESSALON) 100 MG capsule Take 1 capsule (100 mg total) by mouth 3 (three) times daily as needed for cough. 21 capsule Tiffannie Sloss, Nicholes Stairs, NP      PDMP not reviewed this encounter.   Katy Apo, NP 12/23/20 2050

## 2020-12-23 NOTE — Discharge Instructions (Addendum)
Recommend start Tessalon cough pills 1 every 8 hours as needed. Continue to push fluids to stay well hydrated. May take Tylenol 1000mg  every 8 hours as needed for fever. Rest. Stay at home. Follow-up pending COVID 19 test results.

## 2020-12-23 NOTE — ED Triage Notes (Signed)
Pt in with c/o dry cough that started yesterday  Denies any uri sx  Pt has not taken medication for sx

## 2020-12-24 ENCOUNTER — Telehealth (HOSPITAL_COMMUNITY): Payer: Self-pay

## 2021-06-23 ENCOUNTER — Encounter (INDEPENDENT_AMBULATORY_CARE_PROVIDER_SITE_OTHER): Payer: Medicare Other | Admitting: Ophthalmology

## 2021-06-23 ENCOUNTER — Encounter (INDEPENDENT_AMBULATORY_CARE_PROVIDER_SITE_OTHER): Payer: Self-pay

## 2022-02-19 ENCOUNTER — Encounter (INDEPENDENT_AMBULATORY_CARE_PROVIDER_SITE_OTHER): Payer: Medicare Other | Admitting: Ophthalmology

## 2022-02-19 DIAGNOSIS — H35033 Hypertensive retinopathy, bilateral: Secondary | ICD-10-CM

## 2022-02-19 DIAGNOSIS — E113293 Type 2 diabetes mellitus with mild nonproliferative diabetic retinopathy without macular edema, bilateral: Secondary | ICD-10-CM | POA: Diagnosis not present

## 2022-02-19 DIAGNOSIS — H43813 Vitreous degeneration, bilateral: Secondary | ICD-10-CM

## 2022-02-19 DIAGNOSIS — D3132 Benign neoplasm of left choroid: Secondary | ICD-10-CM

## 2022-02-19 DIAGNOSIS — I10 Essential (primary) hypertension: Secondary | ICD-10-CM

## 2022-07-28 ENCOUNTER — Other Ambulatory Visit: Payer: Self-pay

## 2022-07-28 ENCOUNTER — Encounter (HOSPITAL_COMMUNITY): Payer: Self-pay | Admitting: Emergency Medicine

## 2022-07-28 ENCOUNTER — Observation Stay (HOSPITAL_COMMUNITY)
Admission: EM | Admit: 2022-07-28 | Discharge: 2022-07-29 | Disposition: A | Discharge status: Home Health | Payer: Medicare Other | Attending: Family Medicine | Admitting: Family Medicine

## 2022-07-28 ENCOUNTER — Emergency Department (HOSPITAL_COMMUNITY): Payer: Medicare Other

## 2022-07-28 DIAGNOSIS — C61 Malignant neoplasm of prostate: Secondary | ICD-10-CM | POA: Diagnosis not present

## 2022-07-28 DIAGNOSIS — I129 Hypertensive chronic kidney disease with stage 1 through stage 4 chronic kidney disease, or unspecified chronic kidney disease: Secondary | ICD-10-CM | POA: Diagnosis not present

## 2022-07-28 DIAGNOSIS — R260 Ataxic gait: Secondary | ICD-10-CM | POA: Diagnosis not present

## 2022-07-28 DIAGNOSIS — E1169 Type 2 diabetes mellitus with other specified complication: Secondary | ICD-10-CM | POA: Insufficient documentation

## 2022-07-28 DIAGNOSIS — C7951 Secondary malignant neoplasm of bone: Secondary | ICD-10-CM | POA: Diagnosis not present

## 2022-07-28 DIAGNOSIS — R531 Weakness: Secondary | ICD-10-CM | POA: Diagnosis present

## 2022-07-28 DIAGNOSIS — R2689 Other abnormalities of gait and mobility: Secondary | ICD-10-CM | POA: Diagnosis not present

## 2022-07-28 DIAGNOSIS — E1149 Type 2 diabetes mellitus with other diabetic neurological complication: Secondary | ICD-10-CM | POA: Insufficient documentation

## 2022-07-28 DIAGNOSIS — Z79899 Other long term (current) drug therapy: Secondary | ICD-10-CM | POA: Insufficient documentation

## 2022-07-28 DIAGNOSIS — I6381 Other cerebral infarction due to occlusion or stenosis of small artery: Principal | ICD-10-CM | POA: Diagnosis present

## 2022-07-28 DIAGNOSIS — E1122 Type 2 diabetes mellitus with diabetic chronic kidney disease: Secondary | ICD-10-CM | POA: Insufficient documentation

## 2022-07-28 DIAGNOSIS — R2681 Unsteadiness on feet: Secondary | ICD-10-CM | POA: Insufficient documentation

## 2022-07-28 DIAGNOSIS — I152 Hypertension secondary to endocrine disorders: Secondary | ICD-10-CM | POA: Insufficient documentation

## 2022-07-28 DIAGNOSIS — E785 Hyperlipidemia, unspecified: Secondary | ICD-10-CM | POA: Diagnosis not present

## 2022-07-28 DIAGNOSIS — I639 Cerebral infarction, unspecified: Secondary | ICD-10-CM

## 2022-07-28 DIAGNOSIS — N182 Chronic kidney disease, stage 2 (mild): Secondary | ICD-10-CM

## 2022-07-28 HISTORY — DX: Chronic kidney disease, stage 2 (mild): N18.2

## 2022-07-28 LAB — COMPREHENSIVE METABOLIC PANEL
ALT: 15 U/L (ref 0–44)
AST: 21 U/L (ref 15–41)
Albumin: 4.1 g/dL (ref 3.5–5.0)
Alkaline Phosphatase: 86 U/L (ref 38–126)
Anion gap: 12 (ref 5–15)
BUN: 14 mg/dL (ref 8–23)
CO2: 23 mmol/L (ref 22–32)
Calcium: 9.5 mg/dL (ref 8.9–10.3)
Chloride: 100 mmol/L (ref 98–111)
Creatinine, Ser: 0.93 mg/dL (ref 0.61–1.24)
GFR, Estimated: >60 mL/min (ref >60)
Glucose, Bld: 252 mg/dL — ABNORMAL HIGH (ref 70–99)
Potassium: 3.8 mmol/L (ref 3.5–5.1)
Sodium: 135 mmol/L (ref 135–145)
Total Bilirubin: 0.3 mg/dL (ref 0.3–1.2)
Total Protein: 6.8 g/dL (ref 6.5–8.1)

## 2022-07-28 LAB — URINALYSIS, ROUTINE W REFLEX MICROSCOPIC
Bacteria, UA: NONE SEEN
Bilirubin Urine: NEGATIVE
Glucose, UA: 50 mg/dL — AB
Hgb urine dipstick: NEGATIVE
Ketones, ur: NEGATIVE mg/dL
Leukocytes,Ua: NEGATIVE
Nitrite: NEGATIVE
Protein, ur: >=300 mg/dL — AB
Specific Gravity, Urine: 1.019 (ref 1.005–1.030)
pH: 5 (ref 5.0–8.0)

## 2022-07-28 LAB — ETHANOL: Alcohol, Ethyl (B): <10 mg/dL (ref <10)

## 2022-07-28 LAB — CBC
HCT: 44.2 % (ref 39.0–52.0)
Hemoglobin: 15.8 g/dL (ref 13.0–17.0)
MCH: 30.6 pg (ref 26.0–34.0)
MCHC: 35.7 g/dL (ref 30.0–36.0)
MCV: 85.7 fL (ref 80.0–100.0)
Platelets: 183 10*3/uL (ref 150–400)
RBC: 5.16 MIL/uL (ref 4.22–5.81)
RDW: 12.5 % (ref 11.5–15.5)
WBC: 6.5 10*3/uL (ref 4.0–10.5)
nRBC: 0 % (ref 0.0–0.2)

## 2022-07-28 LAB — DIFFERENTIAL
Abs Immature Granulocytes: 0.04 10*3/uL (ref 0.00–0.07)
Basophils Absolute: 0.1 10*3/uL (ref 0.0–0.1)
Basophils Relative: 1 %
Eosinophils Absolute: 0.3 10*3/uL (ref 0.0–0.5)
Eosinophils Relative: 4 %
Immature Granulocytes: 1 %
Lymphocytes Relative: 29 %
Lymphs Abs: 1.9 10*3/uL (ref 0.7–4.0)
Monocytes Absolute: 0.7 10*3/uL (ref 0.1–1.0)
Monocytes Relative: 11 %
Neutro Abs: 3.6 10*3/uL (ref 1.7–7.7)
Neutrophils Relative %: 54 %

## 2022-07-28 LAB — I-STAT CHEM 8, ED
BUN: 15 mg/dL (ref 8–23)
Calcium, Ion: 1.16 mmol/L (ref 1.15–1.40)
Chloride: 100 mmol/L (ref 98–111)
Creatinine, Ser: 0.8 mg/dL (ref 0.61–1.24)
Glucose, Bld: 254 mg/dL — ABNORMAL HIGH (ref 70–99)
HCT: 45 % (ref 39.0–52.0)
Hemoglobin: 15.3 g/dL (ref 13.0–17.0)
Potassium: 3.9 mmol/L (ref 3.5–5.1)
Sodium: 138 mmol/L (ref 135–145)
TCO2: 25 mmol/L (ref 22–32)

## 2022-07-28 LAB — PROTIME-INR
INR: 1.1 (ref 0.8–1.2)
Prothrombin Time: 13.9 seconds (ref 11.4–15.2)

## 2022-07-28 LAB — APTT: aPTT: 28 seconds (ref 24–36)

## 2022-07-28 MED ORDER — ENZALUTAMIDE 40 MG PO CAPS: 160.0000 mg | ORAL_CAPSULE | Freq: Every evening | ORAL | Status: DC

## 2022-07-28 MED ORDER — IRBESARTAN 300 MG PO TABS: 300.0000 mg | ORAL_TABLET | Freq: Every day | ORAL | Status: DC

## 2022-07-28 MED ORDER — IRBESARTAN 300 MG PO TABS
300.0000 mg | ORAL_TABLET | Freq: Once | ORAL | Status: AC
  Administered 2022-07-29: 300 mg via ORAL
  Filled 2022-07-28: qty 1

## 2022-07-28 MED ORDER — ASPIRIN 81 MG PO TBEC
81.0000 mg | DELAYED_RELEASE_TABLET | Freq: Every day | ORAL | Status: DC
  Administered 2022-07-29: 81 mg via ORAL
  Filled 2022-07-28: qty 1

## 2022-07-28 MED ORDER — CLOPIDOGREL BISULFATE 75 MG PO TABS
75.0000 mg | ORAL_TABLET | Freq: Every day | ORAL | Status: DC
  Administered 2022-07-29: 75 mg via ORAL
  Filled 2022-07-28: qty 1

## 2022-07-28 MED ORDER — PANTOPRAZOLE SODIUM 40 MG PO TBEC
40.0000 mg | DELAYED_RELEASE_TABLET | Freq: Every day | ORAL | Status: DC
  Administered 2022-07-28 – 2022-07-29 (×2): 40 mg via ORAL
  Filled 2022-07-28 (×2): qty 1

## 2022-07-28 MED ORDER — TAMSULOSIN HCL 0.4 MG PO CAPS
0.4000 mg | ORAL_CAPSULE | Freq: Every evening | ORAL | Status: DC
  Administered 2022-07-28: 0.4 mg via ORAL
  Filled 2022-07-28: qty 1

## 2022-07-28 MED ORDER — ENOXAPARIN SODIUM 40 MG/0.4ML IJ SOSY
40.0000 mg | PREFILLED_SYRINGE | INTRAMUSCULAR | Status: DC
  Administered 2022-07-28: 40 mg via SUBCUTANEOUS
  Filled 2022-07-28: qty 0.4

## 2022-07-28 NOTE — Assessment & Plan Note (Addendum)
Neuro exam stable, no strength deficits, only has right-sided sensation changes at this time. CTA demonstrates short segment occlusion of the left PCA P3 segment. Risk stratification labs demonstrate HLD and controlled T2DM.  -Neurology following, appreciate recs -Start rosuvastatin given historic myalgias with pravastatin -Awaiting ECHO -SLP, OT, PT eval and treat -Q2H Neuro Checks x 8 hours -Cardiac Monitoring -Up with assistance -Fall precautions -Carb modified diet -Lovenox for VTE ppx

## 2022-07-28 NOTE — Progress Notes (Signed)
Family medicine teaching service will be admitting this patient. Our pager information can be located in the physician sticky notes, treatment team sticky notes, and the headers of all our official daily progress notes.   FAMILY MEDICINE TEACHING SERVICE Patient - Please contact intern pager [phone] or text page via website [email or web address] ([email or web address]: [email or web address]) for questions regarding care. DO NOT page listed attending provider unless there is no answer from the number above.   Jerome Parodi, MD PGY-3, [place] [facility] pager [phone]   

## 2022-07-28 NOTE — Assessment & Plan Note (Addendum)
Patient noted to have CKD II/Iia on nephrology note 12/14/21. Patient noted to have hx of proteinuria, w/ UACR 72 mg/G. Thought to be 2/2 DM2, although patient notes he's prediabetic. Patient UA notable for > 300 protein and 50 glc, w/ GFR 60 on admission. Given hx of CKD and proteinuria, will continue to monitor. Patient is on ARB - Benicar.

## 2022-07-28 NOTE — Progress Notes (Signed)
Checked in on patient. He reports no distress or discomfort. Requests food. Passed RN bedside swallow.   Gen: awake, alert, NAD Neuro: even smile with teeth, grip strength 5/5 and equal BL, able to keep BL arms raised for 5 sec without deviation when eyes closed,  BL legs able to raise and keep up for 5 sec each  Provided [place] sandwich and a drink.   Ezequiel Essex, MD

## 2022-07-28 NOTE — H&P (Addendum)
Hospital Admission History and Physical Service Pager: [phone]  Patient name: Jerome Cardenas [facility] Medical record number: [id number] Date of Birth: 12/27/1950 Age: 72 y.o. Gender: male  Primary Care Provider: Pcp, No Consultants: Neurology Code Status: Full Code    Preferred Emergency Contact:  Madill, Hilda Blades (Spouse) [phone]    Chief Complaint: right sided weakness  Assessment and Plan: Jerome Cardenas is a 72 y.o. male presenting with right sided weakness and right sided facial droop, found to have left sided thalamic stroke on MRI. Etiology of stroke likely 2/2 to patient not being on statin, with hx of cancer. Patient report's he's been off of statin since his heme onc doctor stopped it.   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 Patient neuro exam improving with improving strength on right side and some decreased sensation throughout on right. Patient passed bedside swallow eval with nursing. Neurology has been consulted and will see patient. Etiology of stroke likely form patient not being on statin, note's it was d/c'd by his oncologist for myalgias. Will continue to monitor patient neuro status.  -Admit to [place], attending Dr. McDiarmid -Neurology following, appreciate recs -Permissive HTN -TSH, Hgb A1c, echo, lipid panel -SLP, OT, PT eval and treat -Q2H Neuro Checks -Cardiac Monitoring -Up with assistance -Fall precautions -Carb modified diet -Lovenox for VTE ppx,  -AM BMP, CBC   CKD (chronic kidney disease) stage 2, GFR 60-89 ml/min Patient noted to have CKD II/Iia on nephrology note 12/14/21. Patient noted to have hx of proteinuria, w/ UACR 72 mg/G. Thought to be 2/2 DM2, although patient notes he's prediabetic. Patient UA notable for  > 300 protein and 50 glc, w/ GFR 60 on admission. Given hx of CKD and proteinuria, will continue to monitor. Patient is on ARB - Benicar.       Chronic and stable HTN: Holding home meds for permissive HTN, home meds: Amlodipine 10 mg, Metoprolol succinate 50 mg, olmesartan 40 mg, spironolactone 25 mg HLD: Patient not on statin Prediabetes: Metformin 500 mg daily, holding, will obtain A1c and check CBG on BMP. 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 male presenting with left sided weakness and facial droop. Patient notes that he started having some tingling sensation and numbness in his hands and feet on Thursday, but thought he slept wrong. Today the numbness and tingling extended up into his face and he noticed weakness in his hands and legs with some facial droop. Patient's wife called EMS. Patient report's he's been in his normal state of health, with no nausea, vomiting, diarrhea, fever. He also denies any HA or history of migraine/seizures.   In the ED, patient noted weakness was starting to resolve and received CT Head, and MRI brain that demonstrated left thalamic stroke. Neurology was consulted in ED w/ plans to see patient.   Review Of Systems: Per HPI  Pertinent Past Medical History: HTN HLD Prostate Cancer Prediabetes   Pertinent Past Surgical History: APPENDECTOMY  CHOLECYSTECTOMY      HERNIA REPAIR      PROSTATE SURGERY        Remainder reviewed in history tab.  Pertinent Social History: Tobacco use: Never Alcohol use: None Other Substance use: None Lives with wife  Pertinent Family History: Mother had breast cancer and CVA Father had lung cancer Both sets of  grandparents had lung cancer   Important Outpatient Medications:  Amlodipine  10 mg Metoprolol succinate 50 mg Benicar 40 mg Tamsulosin 0.4 mg Finasterid 5 mg Remainder reviewed in medication history.   Objective: BP (!) 176/99   Pulse 71   Temp 97.9 F (36.6 C)   Resp 17   Ht '5\' 10"'$  (1.778 m)   Wt 94.8 kg   SpO2 99%   BMI 29.99 kg/m  Exam: General: NAD, polite, conversational, good mood Eyes: Clear conjunctiva ENTM: MMM Cardiovascular: S1/S2, RRR, NRMG Respiratory: CATBL Gastrointestinal: Soft, NTTP, non-distended MSK: Cap refill < 2 sec, no edema, moving all extremities spontaneously Neuro: No visual filed deficit, CN II-XII intact, motor 5/5 in BLUE, 4/5 BLLE, sensation slightly decreased throughout on right side, nose to finger test normal, heel to shin test normal   Psych: Good mood, normal affect  Labs:  CBC BMET  Recent Labs  Lab 07/28/22 1237 07/28/22 1255  WBC 6.5  --   HGB 15.8 15.3  HCT 44.2 45.0  PLT 183  --    Recent Labs  Lab 07/28/22 1237 07/28/22 1255  NA 135 138  K 3.8 3.9  CL 100 100  CO2 23  --   BUN 14 15  CREATININE 0.93 0.80  GLUCOSE 252* 254*  CALCIUM 9.5  --     Pertinent additional labs UA w/ > 300 protein and 50 glc   EKG: My own interpretation (not copied from electronic read) Sinus rhythm, Qtc 456   Imaging Studies Performed:  MR BRAIN WO CONTRAST  Result Date: 07/28/2022 CLINICAL DATA:  Neuro deficit, acute, stroke suspected EXAM: MRI HEAD WITHOUT CONTRAST TECHNIQUE: Multiplanar, multiecho pulse sequences of the brain and surrounding structures were obtained without intravenous contrast. COMPARISON:  CT head from the same day. FINDINGS: Brain: Acute left thalamocapsular infarct. Faint associated edema without mass effect. Additional scattered T2/FLAIR hyperintensities within the white matter, nonspecific but compatible with chronic microvascular ischemic disease. No evidence of acute hemorrhage, mass lesion, midline shift  or hydrocephalus. Vascular: Major arterial flow voids are maintained at the skull base. Skull and upper cervical spine: Normal marrow signal. Sinuses/Orbits: Clear sinuses.  No acute orbital findings. Other: No mastoid effusions. IMPRESSION: Acute left thalamocapsular infarct. Electronically Signed   By: Margaretha Sheffield M.D.   On: 07/28/2022 17:53   CT HEAD WO CONTRAST  Result Date: 07/28/2022 CLINICAL DATA:  Transient ischemic attack (TIA) EXAM: CT HEAD WITHOUT CONTRAST TECHNIQUE: Contiguous axial images were obtained from the base of the skull through the vertex without intravenous contrast. RADIATION DOSE REDUCTION: This exam was performed according to the departmental dose-optimization program which includes automated exposure control, adjustment of the mA and/or kV according to patient size and/or use of iterative reconstruction technique. COMPARISON:  None Available. FINDINGS: Brain: There is periventricular white matter decreased attenuation consistent with small vessel ischemic changes. Gray-white differentiation is preserved. No acute intracranial hemorrhage, mass effect or shift. No hydrocephalus. Vascular: No hyperdense vessel or unexpected calcification. Skull: Normal. Negative for fracture or focal lesion. Sinuses/Orbits: No acute finding. IMPRESSION: Periventricular white matter changes consistent with chronic small vessel ischemia. No acute intracranial process  identified. Electronically Signed   By: Sammie Bench M.D.   On: 07/28/2022 14:01      Holley Bouche, MD 07/28/2022, 11:40 PM PGY-2, [place] Intern pager: [phone], text pages welcome Secure chat group [place]

## 2022-07-28 NOTE — ED Provider Triage Note (Signed)
Emergency Medicine Provider Triage Evaluation Note  Jerome Cardenas , a 72 y.o. male  was evaluated in triage.  Pt complains of right-sided weakness.  Started 2 days ago with his right upper extremity, yesterday he had weakness to the right lower extremity which resolved today.  He is having subjective facial numbness to the right side of his face starting today prompting ED visit due to concern for TIA versus stroke.  No history of strokes or TIA, denies any vision changes, headache, nausea, vomiting..  Review of Systems  Per HPI  Physical Exam  BP (!) 178/107   Pulse 72   Temp 98.5 F (36.9 C) (Oral)   Resp 16   SpO2 97%  Gen:   Awake, no distress   Resp:  Normal effort  MSK:   Moves extremities without difficulty  Other:  Subjective right-sided decree sensation, cranial nerves II through XII are grossly intact.  Upper and lower extremity strength is symmetric bilaterally.  No dysarthria, no pronator drift.  Medical Decision Making  Medically screening exam initiated at 12:23 PM.  Appropriate orders placed.  Estella Husk Mach was informed that the remainder of the evaluation will be completed by another provider, this initial triage assessment does not replace that evaluation, and the importance of remaining in the ED until their evaluation is complete.  LVO negative, outside of window to activate as a stroke.  Will initiate workup.   Sherrill Raring, PA-C 07/28/22 1224

## 2022-07-28 NOTE — Consult Note (Addendum)
NEUROLOGY CONSULTATION NOTE   Date of service: July 28, 2022 Patient Name: Jerome Cardenas MRN:  [id number] DOB:  28-Apr-1951 Reason for consult: "left thalamocapsular stroke" Requesting Provider: McDiarmid, Blane Ohara, MD _ _ _   _ __   _ __ _ _  __ __   _ __   __ _  History of Present Illness  Jerome Cardenas is a 71 y.o. male with PMH significant for HTN, HLD, GERD, who presents with 2 day hx of right sided weakness and R facial droop.  Thusday, woke up with numbness and tingling but felt that he slept wrong. Symptoms worsened onfriday along with facial droop on the right and right sided weakness. Woke up with worsening symptoms today and so came to the ED. Symptoms improved in the ED.  No prior hx of stroke, mother had stroke. No hx of DM2. Reports HTN and check BP at home and runs in 010U systolic. Takes pravastatin for hyperlipidemia. Does not smoke, no EtOH. No recreational substances.  LKW: 2230 on 07/25/22. mRS: 0 tNKASE: not offered, outside window Thrombectomy: not offered, outside window. NIHSS components Score: Comment  1a Level of Conscious 0'[x]'$  1'[]'$  2'[]'$  3'[]'$      1b LOC Questions 0'[x]'$  1'[]'$  2'[]'$       1c LOC Commands 0'[x]'$  1'[]'$  2'[]'$       2 Best Gaze 0'[x]'$  1'[]'$  2'[]'$       3 Visual 0'[x]'$  1'[]'$  2'[]'$  3'[]'$      4 Facial Palsy 0'[x]'$  1'[]'$  2'[]'$  3'[]'$      5a Motor Arm - left 0'[x]'$  1'[]'$  2'[]'$  3'[]'$  4'[]'$  UN'[]'$    5b Motor Arm - Right 0'[x]'$  1'[]'$  2'[]'$  3'[]'$  4'[]'$  UN'[]'$    6a Motor Leg - Left 0'[x]'$  1'[]'$  2'[]'$  3'[]'$  4'[]'$  UN'[]'$    6b Motor Leg - Right 0'[x]'$  1'[]'$  2'[]'$  3'[]'$  4'[]'$  UN'[]'$    7 Limb Ataxia 0'[x]'$  1'[]'$  2'[]'$  3'[]'$  UN'[]'$     8 Sensory 0'[]'$  1'[]'$  2'[x]'$  UN'[]'$      9 Best Language 0'[x]'$  1'[]'$  2'[]'$  3'[]'$      10 Dysarthria 0'[]'$  1'[x]'$  2'[]'$  UN'[]'$      11 Extinct. and Inattention 0'[x]'$  1'[]'$  2'[]'$       TOTAL: 3     ROS   Constitutional Denies weight loss, fever and chills.   HEENT Denies changes in vision and hearing.   Respiratory Denies SOB and cough.   CV Denies palpitations and CP   GI Denies abdominal pain, nausea, vomiting and diarrhea.   GU Denies dysuria  and urinary frequency.   MSK Denies myalgia and joint pain.   Skin Denies rash and pruritus.   Neurological Denies headache and syncope.   Psychiatric Denies recent changes in mood. Denies anxiety and depression.    Past History   Past Medical History:  Diagnosis Date   Cancer ([place])    prostate   HLD (hyperlipidemia)    Hypertension    Past Surgical History:  Procedure Laterality Date   APPENDECTOMY     CHOLECYSTECTOMY     HERNIA REPAIR     PROSTATE SURGERY     prostate cancer 2014   History reviewed. No pertinent family history. Social History   Socioeconomic History   Marital status: Married    Spouse name: Not on file   Number of children: Not on file   Years of education: Not on file   Highest education level: Not on file  Occupational History   Not on file  Tobacco Use   Smoking status: Never   Smokeless tobacco: Never  Substance and Sexual Activity   Alcohol use: No   Drug use: No   Sexual activity: Yes  Other Topics Concern   Not on file  Social History Narrative   Not on file   Social Determinants of Health   Financial Resource Strain: Not on file  Food Insecurity: Not on file  Transportation Needs: Not on file  Physical Activity: Not on file  Stress: Not on file  Social Connections: Not on file   Allergies  Allergen Reactions   Pravachol [Pravastatin] Other (See Comments)    Myalgias     Medications  (Not in a hospital admission)    Vitals   Vitals:   07/28/22 1218 07/28/22 1526 07/28/22 1821 07/28/22 2037  BP: (!) 178/107 (!) 177/90 (!) 176/99   Pulse: 72 67 71   Resp: '16 16 17   '$ Temp: 98.5 F (36.9 C) 98.3 F (36.8 C) 97.9 F (36.6 C)   TempSrc: Oral Oral    SpO2: 97% 97% 99%   Weight:    94.8 kg  Height:    '5\' 10"'$  (1.778 m)     Body mass index is 29.99 kg/m.  Physical Exam   General: Laying comfortably in bed; in no acute distress.  HENT: Normal oropharynx and mucosa. Normal external appearance of ears and nose.   Neck: Supple, no pain or tenderness  CV: No JVD. No peripheral edema.  Pulmonary: Symmetric Chest rise. Normal respiratory effort.  Abdomen: Soft to touch, non-tender.  Ext: No cyanosis, edema, or deformity  Skin: No rash. Normal palpation of skin.   Musculoskeletal: Normal digits and nails by inspection. No clubbing.   Neurologic Examination  Mental status/Cognition: Alert, oriented to self, place, month and year, good attention.  Speech/language: Mildly dysarthric speech, fluent, comprehension intact, object naming intact, repetition intact.  Cranial nerves:   CN II Pupils equal and reactive to light, no VF deficits    CN III,IV,VI EOM intact, no gaze preference or deviation, no nystagmus    CN V normal sensation in V1, V2, and V3 segments bilaterally    CN VII no asymmetry, no nasolabial fold flattening    CN VIII normal hearing to speech    CN IX & X normal palatal elevation, no uvular deviation    CN XI 5/5 head turn and 5/5 shoulder shrug bilaterally    CN XII midline tongue protrusion    Motor:  Muscle bulk: normal, tone normal, pronator drift noted in RUE. tremor none Mvmt Root Nerve  Muscle Right Left Comments  SA C5/6 Ax Deltoid 5 5   EF C5/6 Mc Biceps 5 5   EE C6/7/8 Rad Triceps 5 5   WF C6/7 Med FCR     WE C7/8 PIN ECU     F Ab C8/T1 U ADM/FDI 5 5   HF L1/2/3 Fem Illopsoas 5 5   KE L2/3/4 Fem Quad 5 5   DF L4/5 D Peron Tib Ant 5 5   PF S1/2 Tibial Grc/Sol 5 5    Sensation:  Light touch Decreased in right lower face, right upper extremity and right lower extremity to touch.   Pin prick    Temperature    Vibration   Proprioception    Coordination/Complex Motor:  - Finger to Nose intact bilaterally - Heel to shin with may be questionable mild ataxia in right lower extremity. - Rapid alternating movement intact bilaterally - Gait: Deferred for patient's safety. Labs   CBC:  Recent Labs  Lab 07/28/22  1237 07/28/22 1255  WBC 6.5  --   NEUTROABS 3.6  --    HGB 15.8 15.3  HCT 44.2 45.0  MCV 85.7  --   PLT 183  --     Basic Metabolic Panel:  Lab Results  Component Value Date   NA 138 07/28/2022   K 3.9 07/28/2022   CO2 23 07/28/2022   GLUCOSE 254 (H) 07/28/2022   BUN 15 07/28/2022   CREATININE 0.80 07/28/2022   CALCIUM 9.5 07/28/2022   GFRNONAA >60 07/28/2022   Lipid Panel: No results found for: "LDLCALC" HgbA1c: No results found for: "HGBA1C" Urine Drug Screen: No results found for: "LABOPIA", "COCAINSCRNUR", "LABBENZ", "AMPHETMU", "THCU", "LABBARB"  Alcohol Level     Component Value Date/Time   ETH <10 07/28/2022 1237    CT Head without contrast(Personally reviewed): CTH was negative for a large hypodensity concerning for a large territory infarct or hyperdensity concerning for an ICH  CT angio Head and Neck with contrast(Personally reviewed): pending  MRI Brain(Personally reviewed): Acute left thalamic stroke.   Impression   Jerome Cardenas is a 72 y.o. male with PMH significant for HTN, HLD, GERD, who presents with 2 day hx of right sided weakness and R facial droop.  He was found to have acute left lateral thalamus infarct.  His neuroexam demonstrates sensory deficit in right face, right upper extremity and right lower extremity with may be a mild questionable ataxia in right lower extremity and some mild slurring of his speech.  Etiology of the stroke is unclear at this time.  He is pending workup.  Concern that this is likely small vessel stroke.  Primary Diagnosis:  Other cerebral infarction due to occlusion of stenosis of small artery.  Secondary Diagnosis: Essential (primary) hypertension  Recommendations   - Frequent Neuro checks per stroke unit protocol - Recommend Vascular imaging with CT angio head and neck with contrast. - Recommend obtaining TTE - Recommend obtaining Lipid panel with LDL - Please start statin if LDL > 70 - Recommend HbA1c to evaluate for diabetes and how well it is controlled. -  Antithrombotic -aspirin 81 mg daily along with Plavix 75 mg daily for 21 days, followed by aspirin 81 mg daily alone. - Recommend DVT ppx - SBP goal -aim for gradual normotension, he is outside of 24-hour window. - Recommend Telemetry monitoring for arrythmia - Recommend bedside swallow screen prior to PO intake. - Stroke education booklet - Recommend PT/OT/SLP consult - counseled him on the importance of coming to the ED at the first sign of stroke.  ______________________________________________________________________   Thank you for the opportunity to take part in the care of this patient. If you have any further questions, please contact the neurology consultation attending.  Signed,  [place] Pager Number [phone] _ _ _   _ __   _ __ _ _  __ __   _ __   __ _

## 2022-07-28 NOTE — ED Provider Notes (Signed)
[facility] Provider Note   CSN: [id number] Arrival date & time: 07/28/22  1207     History  Chief Complaint  Patient presents with   Extremity Weakness    Jerome Cardenas is a 72 y.o. male history of hypertension, here presenting with right-sided weakness.  Patient states that 2 days ago he noticed right arm and leg weakness and numbness.  He states that he woke up this morning and noticed that he has right facial droop.  Denies any trouble speaking.  He called his doctor was sent here for possible stroke.  No history of stroke in the past.  Patient is not on any blood thinners.  The history is provided by the patient.       Home Medications Prior to Admission medications   Medication Sig Start Date End Date Taking? Authorizing Provider  amLODipine (NORVASC) 10 MG tablet Take 10 mg by mouth daily.    [provider]  amLODipine (NORVASC) 5 MG tablet Take 1 tablet by mouth daily. 12/24/16   [provider]  benzonatate (TESSALON) 100 MG capsule Take 1 capsule (100 mg total) by mouth 3 (three) times daily as needed for cough. 12/23/20   Katy Apo, NP  Cholecalciferol 25 MCG (1000 UT) capsule Take by mouth.    [provider]  fenofibrate (TRICOR) 48 MG tablet Take by mouth. 10/04/16   [provider]  finasteride (PROSCAR) 5 MG tablet Take 5 mg by mouth daily.    [provider]  finasteride (PROSCAR) 5 MG tablet finasteride 5 mg tablet 04/27/15   [provider]  metoprolol succinate (TOPROL-XL) 50 MG 24 hr tablet Take 50 mg by mouth daily. Take with or immediately following a meal.    [provider]  olmesartan-hydrochlorothiazide (BENICAR HCT) 40-25 MG per tablet Take 1 tablet by mouth daily.    [provider]  omeprazole (PRILOSEC) 20 MG capsule Take 20 mg by mouth daily.    [provider]  pravastatin (PRAVACHOL) 20 MG tablet Take 20 mg by mouth  daily.    [provider]  sildenafil (VIAGRA) 100 MG tablet Take 100 mg by mouth daily as needed for erectile dysfunction.    [provider]  tadalafil (CIALIS) 5 MG tablet Take 5 mg by mouth daily as needed for erectile dysfunction.    [provider]  tamsulosin (FLOMAX) 0.4 MG CAPS capsule Take 0.4 mg by mouth.    [provider]      Allergies    Patient has no known allergies.    Review of Systems   Review of Systems  Neurological:        R arm numbness   All other systems reviewed and are negative.   Physical Exam Updated Vital Signs BP (!) 177/90 (BP Location: Right Arm)   Pulse 67   Temp 98.3 F (36.8 C) (Oral)   Resp 16   SpO2 97%  Physical Exam Vitals and nursing note reviewed.  Constitutional:      Comments: Chronically ill   HENT:     Head: Normocephalic.     Nose: Nose normal.     Mouth/Throat:     Mouth: Mucous membranes are moist.  Eyes:     Extraocular Movements: Extraocular movements intact.     Pupils: Pupils are equal, round, and reactive to light.  Cardiovascular:     Rate and Rhythm: Normal rate and regular rhythm.  Pulses: Normal pulses.     Heart sounds: Normal heart sounds.  Pulmonary:     Effort: Pulmonary effort is normal.     Breath sounds: Normal breath sounds.  Abdominal:     General: Abdomen is flat.     Palpations: Abdomen is soft.  Musculoskeletal:        General: Normal range of motion.     Cervical back: Normal range of motion and neck supple.  Skin:    General: Skin is warm.  Neurological:     Mental Status: He is alert.     Comments: Right facial droop and strength is 4 out of 5 on the right arm and right leg.  Slightly decreased sensation of the right arm and leg  Psychiatric:        Mood and Affect: Mood normal.        Behavior: Behavior normal.     ED Results / Procedures / Treatments   Labs (all labs ordered are listed, but only abnormal results are displayed) Labs Reviewed   COMPREHENSIVE METABOLIC PANEL - Abnormal; Notable for the following components:      Result Value   Glucose, Bld 252 (*)    All other components within normal limits  URINALYSIS, ROUTINE W REFLEX MICROSCOPIC - Abnormal; Notable for the following components:   Color, Urine AMBER (*)    APPearance HAZY (*)    Glucose, UA 50 (*)    Protein, ur >=300 (*)    All other components within normal limits  I-STAT CHEM 8, ED - Abnormal; Notable for the following components:   Glucose, Bld 254 (*)    All other components within normal limits  ETHANOL  PROTIME-INR  APTT  CBC  DIFFERENTIAL  RAPID URINE DRUG SCREEN, HOSP PERFORMED    EKG None  Radiology CT HEAD WO CONTRAST  Result Date: 07/28/2022 CLINICAL DATA:  Transient ischemic attack (TIA) EXAM: CT HEAD WITHOUT CONTRAST TECHNIQUE: Contiguous axial images were obtained from the base of the skull through the vertex without intravenous contrast. RADIATION DOSE REDUCTION: This exam was performed according to the departmental dose-optimization program which includes automated exposure control, adjustment of the mA and/or kV according to patient size and/or use of iterative reconstruction technique. COMPARISON:  None Available. FINDINGS: Brain: There is periventricular white matter decreased attenuation consistent with small vessel ischemic changes. Gray-white differentiation is preserved. No acute intracranial hemorrhage, mass effect or shift. No hydrocephalus. Vascular: No hyperdense vessel or unexpected calcification. Skull: Normal. Negative for fracture or focal lesion. Sinuses/Orbits: No acute finding. IMPRESSION: Periventricular white matter changes consistent with chronic small vessel ischemia. No acute intracranial process identified. Electronically Signed   By: Sammie Bench M.D.   On: 07/28/2022 14:01    Procedures Procedures    Medications Ordered in ED Medications - No data to display  ED Course/ Medical Decision Making/ A&P                              Medical Decision Making Jerome Cardenas is a 72 y.o. male here presenting with right arm and leg numbness and weakness.  Patient also has right facial droop now.  Concern for evolving stroke.  Onset was about 2 days ago.  Will get CT head and if negative will need MRI brain as well as CBC and CMP.  6:55 PM MRI showed left thalamic stroke.  Patient will be admitted and I consulted Dr. Quinn Axe from neurology to  see patient.  Problems Addressed: Cerebrovascular accident (CVA), unspecified mechanism ([place]): acute illness or injury  Amount and/or Complexity of Data Reviewed Labs: ordered. Decision-making details documented in ED Course. Radiology: ordered and independent interpretation performed. Decision-making details documented in ED Course.  Risk Decision regarding hospitalization.    Final Clinical Impression(s) / ED Diagnoses Final diagnoses:  None    Rx / DC Orders ED Discharge Orders     None         Drenda Freeze, MD 07/28/22 [phone]

## 2022-07-28 NOTE — ED Triage Notes (Signed)
Pt BIB [facility] from home. Pt reports right sided arm weakness since Thursday. Pt reports right leg weakness that started yesterday but has resolved at this time. BS 280.

## 2022-07-29 ENCOUNTER — Observation Stay (HOSPITAL_COMMUNITY): Payer: Medicare Other

## 2022-07-29 ENCOUNTER — Observation Stay (HOSPITAL_BASED_OUTPATIENT_CLINIC_OR_DEPARTMENT_OTHER): Payer: Medicare Other

## 2022-07-29 ENCOUNTER — Encounter (HOSPITAL_COMMUNITY): Payer: Self-pay | Admitting: Family Medicine

## 2022-07-29 DIAGNOSIS — E1169 Type 2 diabetes mellitus with other specified complication: Secondary | ICD-10-CM | POA: Insufficient documentation

## 2022-07-29 DIAGNOSIS — I6381 Other cerebral infarction due to occlusion or stenosis of small artery: Secondary | ICD-10-CM

## 2022-07-29 DIAGNOSIS — E1159 Type 2 diabetes mellitus with other circulatory complications: Secondary | ICD-10-CM | POA: Diagnosis not present

## 2022-07-29 DIAGNOSIS — E1149 Type 2 diabetes mellitus with other diabetic neurological complication: Secondary | ICD-10-CM | POA: Insufficient documentation

## 2022-07-29 DIAGNOSIS — I152 Hypertension secondary to endocrine disorders: Secondary | ICD-10-CM | POA: Insufficient documentation

## 2022-07-29 DIAGNOSIS — E785 Hyperlipidemia, unspecified: Secondary | ICD-10-CM

## 2022-07-29 LAB — CBC
HCT: 40.8 % (ref 39.0–52.0)
Hemoglobin: 14.9 g/dL (ref 13.0–17.0)
MCH: 31 pg (ref 26.0–34.0)
MCHC: 36.5 g/dL — ABNORMAL HIGH (ref 30.0–36.0)
MCV: 84.8 fL (ref 80.0–100.0)
Platelets: 161 10*3/uL (ref 150–400)
RBC: 4.81 MIL/uL (ref 4.22–5.81)
RDW: 12.6 % (ref 11.5–15.5)
WBC: 7.3 10*3/uL (ref 4.0–10.5)
nRBC: 0 % (ref 0.0–0.2)

## 2022-07-29 LAB — LIPID PANEL
Cholesterol: 139 mg/dL (ref 0–200)
HDL: 23 mg/dL — ABNORMAL LOW (ref >40)
Total CHOL/HDL Ratio: 6 RATIO
Triglycerides: 371 mg/dL — ABNORMAL HIGH (ref <150)
VLDL: 74 mg/dL — ABNORMAL HIGH (ref 0–40)

## 2022-07-29 LAB — BASIC METABOLIC PANEL
Anion gap: 11 (ref 5–15)
BUN: 15 mg/dL (ref 8–23)
CO2: 24 mmol/L (ref 22–32)
Calcium: 9.3 mg/dL (ref 8.9–10.3)
Chloride: 99 mmol/L (ref 98–111)
Creatinine, Ser: 1.04 mg/dL (ref 0.61–1.24)
GFR, Estimated: >60 mL/min (ref >60)
Glucose, Bld: 200 mg/dL — ABNORMAL HIGH (ref 70–99)
Potassium: 3.7 mmol/L (ref 3.5–5.1)
Sodium: 134 mmol/L — ABNORMAL LOW (ref 135–145)

## 2022-07-29 LAB — ECHOCARDIOGRAM COMPLETE
Area-P 1/2: 2.43 cm2
Height: 70 in
S' Lateral: 2.7 cm
Weight: 3344 oz

## 2022-07-29 LAB — HEMOGLOBIN A1C
Hgb A1c MFr Bld: 6.6 % — ABNORMAL HIGH (ref 4.8–5.6)
Mean Plasma Glucose: 142.72 mg/dL

## 2022-07-29 LAB — LDL CHOLESTEROL, DIRECT: Direct LDL: 67 mg/dL (ref 0–99)

## 2022-07-29 LAB — TSH: TSH: 3.821 u[IU]/mL (ref 0.350–4.500)

## 2022-07-29 MED ORDER — DICLOFENAC SODIUM 1 % EX GEL
4.0000 g | Freq: Four times a day (QID) | CUTANEOUS | Status: DC
  Filled 2022-07-29: qty 100

## 2022-07-29 MED ORDER — AMLODIPINE BESYLATE 5 MG PO TABS
10.0000 mg | ORAL_TABLET | Freq: Every day | ORAL | Status: DC
  Administered 2022-07-29: 10 mg via ORAL
  Filled 2022-07-29: qty 2

## 2022-07-29 MED ORDER — METFORMIN HCL ER 500 MG PO TB24: 1000.0000 mg | ORAL_TABLET | Freq: Two times a day (BID) | ORAL | Status: DC

## 2022-07-29 MED ORDER — VITAMIN D 25 MCG (1000 UNIT) PO TABS
5000.0000 [IU] | ORAL_TABLET | Freq: Every day | ORAL | Status: DC
  Administered 2022-07-29: 5000 [IU] via ORAL
  Filled 2022-07-29: qty 5

## 2022-07-29 MED ORDER — VITAMIN B-12 1000 MCG PO TABS
1000.0000 ug | ORAL_TABLET | Freq: Every day | ORAL | Status: DC
  Administered 2022-07-29: 1000 ug via ORAL
  Filled 2022-07-29: qty 1

## 2022-07-29 MED ORDER — ROSUVASTATIN CALCIUM 20 MG PO TABS: 20.0000 mg | ORAL_TABLET | Freq: Every day | ORAL | 1 refills | Status: DC

## 2022-07-29 MED ORDER — ASPIRIN 81 MG PO TBEC: 81.0000 mg | DELAYED_RELEASE_TABLET | Freq: Every day | ORAL | 12 refills | Status: DC

## 2022-07-29 MED ORDER — IOHEXOL 350 MG/ML SOLN
75.0000 mL | Freq: Once | INTRAVENOUS | Status: AC | PRN
  Administered 2022-07-29: 75 mL via INTRAVENOUS

## 2022-07-29 MED ORDER — SPIRONOLACTONE 12.5 MG HALF TABLET
25.0000 mg | ORAL_TABLET | Freq: Every day | ORAL | Status: DC
  Administered 2022-07-29: 25 mg via ORAL
  Filled 2022-07-29: qty 2

## 2022-07-29 MED ORDER — CLOPIDOGREL BISULFATE 75 MG PO TABS: 75.0000 mg | ORAL_TABLET | Freq: Every day | ORAL | 0 refills | Status: AC

## 2022-07-29 MED ORDER — METFORMIN HCL ER (OSM) 1000 MG PO TB24: 1000.0000 mg | ORAL_TABLET | Freq: Two times a day (BID) | ORAL | 0 refills | Status: DC

## 2022-07-29 MED ORDER — ROSUVASTATIN CALCIUM 20 MG PO TABS
20.0000 mg | ORAL_TABLET | Freq: Every day | ORAL | Status: DC
  Administered 2022-07-29: 20 mg via ORAL
  Filled 2022-07-29: qty 1

## 2022-07-29 MED ORDER — INFLUENZA VAC A&B SA ADJ QUAD 0.5 ML IM PRSY: 0.5000 mL | PREFILLED_SYRINGE | INTRAMUSCULAR | Status: DC

## 2022-07-29 MED ORDER — METOPROLOL SUCCINATE ER 25 MG PO TB24
50.0000 mg | ORAL_TABLET | Freq: Every day | ORAL | Status: DC
  Administered 2022-07-29: 50 mg via ORAL
  Filled 2022-07-29: qty 2

## 2022-07-29 NOTE — Discharge Summary (Addendum)
[facility] Discharge Summary  Patient name: Jerome Cardenas Medical record number: [id number] Date of birth: [birth year] Age: 72 y.o. Gender: male Date of Admission: 07/28/2022  Date of Discharge: 07/29/22 Admitting Physician: Blane Ohara McDiarmid, MD  Primary Care Provider: Pcp, No Consultants: Neurology  Indication for Hospitalization: Left thalamic stroke  Discharge Diagnoses/Problem List:  Hypertension Metastatic prostate cancer with bony mets Hyperlipidemia BPH CKD 2 GERD Vitamin D deficiency  Principal Problem for Admission: Left thalamic stroke Other Problems addressed during stay:  Principal Problem:   Thalamic stroke ([place]) Active Problems:   Diabetes mellitus type 2 with neurological manifestations ([place])   Hypertension associated with diabetes ([place])   Hyperlipidemia associated with type 2 diabetes mellitus [facility])   Brief Hospital Course:  THANG FLETT is a 72 y.o. male presenting with left thalamic stroke outside of TPA window. High risk due to current malignancy and HLD. PMH significant for CKD stage II, HTN, GERD, vitamin D deficiency, hyperlipidemia, BPH, metastatic prostate cancer s/p radiation therapy.   Patient reported symptoms of right arm and leg weakness with sensation changes starting 1/18.  Presented to the hospital 1/20 after he developed right facial tingling that morning.  Patient was outside of tPA window.  CT head showed only chronic small vessel ischemia, no bleeding.  MRI head with acute left thalamic capsular infarct.  CTA demonstrates short segment occlusion of left PCA P3 segment and chronic ischemic microangiopathy with no large vessel occlusion.  Echo with LVEF 60-65%. No RWA. Mild LVH. G1DD.   Neurology consulted, recommended routine at risk stratification.  Was found to have A1c of 6.6 and lipids significant for triglycerides 371, HDL 23, and VLDL 74.  TSH, CBC, and BMP unremarkable aside from elevated glucose to  200.  Patient started on aspirin 81 mg daily and Plavix, should be continued x 21 days with plan to reduce to aspirin only thereafter.  Started on rosuvastatin 20 mg for high intensity statin treatment given prior myalgias with pravastatin.  Metformin increased to max dose of 1000 twice daily, prescribed metformin XR to help with GI tolerance.  Disposition: Home, with PT  Discharge Condition: Stable  Issues for Follow Up:  Start aspirin 81 mg and plavix daily x 21 days, then asa only afterwards, should be continued indefinitely.  Started rosuvastatin 20 mg (given myalgias with pravastatin).  Follow-up with direct LDL or other lipid measurement as appropriate to ensure adherence. Increased metformin to max dose of 1000 mg twice daily.  Follow-up for tolerance, recheck A1c in late March. PCP to ensure patient connected with neurology for follow-up. PCP to ensure patient connected with, PT, order placed prior to discharge 1/21.   Discharge Exam:  Vitals:   07/29/22 1415 07/29/22 1500  BP: 134/69 (!) 147/81  Pulse: 62 69  Resp: 18 20  Temp: 97.7 F (36.5 C)   SpO2: 96% 96%   Per 1/21 AM physical exam:  Temp:  [97.9 F (36.6 C)-98.5 F (36.9 C)] 97.9 F (36.6 C) (01/21 0645) Pulse Rate:  [67-75] 70 (01/21 0645) Resp:  [16-17] 16 (01/21 0645) BP: (156-178)/(82-107) 162/82 (01/21 0645) SpO2:  [97 %-99 %] 98 % (01/21 0645) Weight:  [94.8 kg] 94.8 kg (01/20 2037) Physical Exam: General: Awake, alert, NAD Cardiovascular: Regular rate and rhythm, no murmur Respiratory: CTAB anteriorly Abdomen: Soft, nondistended Neuro: Symmetric smile with teeth, strong resisted head turn equal BL, shoulder shrug and grip strength 5/5 and equal BL, BUE push/pull 5/5 equal BL, BLE leg  lift, foot extension and flexion 5/5 ankle BL, decreased sensation over right arm and leg  Significant Procedures: None  Significant Labs and Imaging:  Recent Labs  Lab 07/28/22 1237 07/28/22 1255 07/29/22 0235   WBC 6.5  --  7.3  HGB 15.8 15.3 14.9  HCT 44.2 45.0 40.8  PLT 183  --  161   Recent Labs  Lab 07/28/22 1237 07/28/22 1255 07/29/22 0235  NA 135 138 134*  K 3.8 3.9 3.7  CL 100 100 99  CO2 23  --  24  GLUCOSE 252* 254* 200*  BUN '14 15 15  '$ CREATININE 0.93 0.80 1.04  CALCIUM 9.5  --  9.3  ALKPHOS 86  --   --   AST 21  --   --   ALT 15  --   --   ALBUMIN 4.1  --   --     CT HEAD WITHOUT CONTRAST IMPRESSION: Periventricular white matter changes consistent with chronic small vessel ischemia. No acute intracranial process identified.   MRI HEAD WITHOUT CONTRAST  FINDINGS: Brain: Acute left thalamocapsular infarct. Faint associated edema without mass effect. Additional scattered T2/FLAIR hyperintensities within the white matter, nonspecific but compatible with chronic microvascular ischemic disease. No evidence of acute hemorrhage, mass lesion, midline shift or hydrocephalus. Vascular: Major arterial flow voids are maintained at the skull base. Skull and upper cervical spine: Normal marrow signal. Sinuses/Orbits: Clear sinuses.  No acute orbital findings. Other: No mastoid effusions. IMPRESSION: Acute left thalamocapsular infarct.   CT ANGIOGRAPHY HEAD AND NECK IMPRESSION: 1. Short segment occlusion of the left PCA P3 segment. 2. No proximal large vessel occlusion. 3. Chronic ischemic microangiopathy.  ECHO LVEF 60-65%. No RWA. Mild LVH. G1DD  Results/Tests Pending at Time of Discharge: None  Discharge Medications:  Allergies as of 07/29/2022       Reactions   Pravachol [pravastatin] Other (See Comments)   Myalgias         Medication List     STOP taking these medications    metFORMIN 500 MG tablet Commonly known as: GLUCOPHAGE Replaced by: metformin 1000 MG (OSM) 24 hr tablet       TAKE these medications    amLODipine 10 MG tablet Commonly known as: NORVASC Take 10 mg by mouth daily.   APPLE CIDER VINEGAR PO Take 1 tablet by mouth daily.    aspirin EC 81 MG tablet Take 1 tablet (81 mg total) by mouth daily. Swallow whole.   clopidogrel 75 MG tablet Commonly known as: PLAVIX Take 1 tablet (75 mg total) by mouth daily for 21 days.   ELDERBERRY PO Take 1 capsule by mouth daily.   enzalutamide 40 MG capsule Commonly known as: XTANDI Take 160 mg by mouth every evening.   metformin 1000 MG (OSM) 24 hr tablet Commonly known as: FORTAMET Take 1 tablet (1,000 mg total) by mouth 2 (two) times daily with a meal. Start taking on: July 31, 2022 Replaces: metFORMIN 500 MG tablet   metoprolol succinate 50 MG 24 hr tablet Commonly known as: TOPROL-XL Take 50 mg by mouth daily. Take with or immediately following a meal.   olmesartan 40 MG tablet Commonly known as: BENICAR Take 40 mg by mouth daily.   omeprazole 20 MG capsule Commonly known as: PRILOSEC Take 20 mg by mouth daily.   rosuvastatin 20 MG tablet Commonly known as: CRESTOR Take 1 tablet (20 mg total) by mouth daily.   spironolactone 25 MG tablet Commonly known as: ALDACTONE Take 25 mg  by mouth daily.   tamsulosin 0.4 MG Caps capsule Commonly known as: FLOMAX Take 0.4 mg by mouth every evening.   VITAMIN B-12 PO Take 1 tablet by mouth daily.   VITAMIN D-3 PO Take 1 capsule by mouth daily.               Durable Medical Equipment  (From admission, onward)           Start     Ordered   07/29/22 1021  For home use only DME Walker rolling  Once       Comments: Stroke  Question Answer Comment  Walker: With [place] Wheels   Patient needs a walker to treat with the following condition Weakness      07/29/22 1020           Discharge Instructions: Please refer to Patient Instructions section of EMR for full details.  Patient was counseled important signs and symptoms that should prompt return to medical care, changes in medications, dietary instructions, activity restrictions, and follow up appointments.   Follow-Up Appointments: PCP  within one week  Follow-up Information     [facility]. Schedule an appointment as soon as possible for a visit.   Specialty: Family Medicine Why: Please clal to establish a primary care doctor. Contact information: [address] [place] [facility]. Schedule an appointment as soon as possible for a visit in 1 month(s).   Specialty: Neurology Why: stroke clinic Contact information: [place], [place], DO 07/29/2022, 4:41 PM PGY-3, [place]

## 2022-07-29 NOTE — Care Management Obs Status (Signed)
[facility] NOTIFICATION   Patient Details  Name: Jerome Cardenas MRN: [id number] Date of Birth: [birth year]   Medicare Observation Status Notification Given:  Yes   Verbal permission to sign Verdell Carmine, RN 07/29/2022, 10:13 AM

## 2022-07-29 NOTE — Evaluation (Signed)
Clinical/Bedside Swallow Evaluation Patient Details  Name: Jerome Cardenas MRN: [id number] Date of Birth: May 21, 1951  Today's Date: 07/29/2022 Time: SLP Start Time (ACUTE ONLY): 1124 SLP Stop Time (ACUTE ONLY): 1132 SLP Time Calculation (min) (ACUTE ONLY): 8 min  Past Medical History:  Past Medical History:  Diagnosis Date   Cancer ([place])    prostate   CKD (chronic kidney disease) stage 2, GFR 60-89 ml/min 07/28/2022   HLD (hyperlipidemia)    Hypertension    Past Surgical History:  Past Surgical History:  Procedure Laterality Date   APPENDECTOMY     CHOLECYSTECTOMY     HERNIA REPAIR     PROSTATE SURGERY     prostate cancer 2014   HPI:  Jerome Cardenas is a 72 y.o. male presenting with left thalamic stroke outside of TPA window. High risk due to current malignancy and HLD. PMH significant for CKD stage II, HTN, GERD, vitamin D deficiency, hyperlipidemia, BPH, metastatic prostate cancer s/p radiation therapy    Assessment / Plan / Recommendation  Clinical Impression  Pt presents with normal swallowing as assessed clinically.  Pt tolerated all consistencies trialed with no clinical s/s of aspiration including large serial straw sips of thin liquid. Pt with decreased sensation on R side.  During OME pt endorsed facial differences in sensation, but no R sided lingual decreased in sensation.  Pt exhibited good oral clearance of solids.    Recommend continuing regular texture diet with thin liquids.     Pt's speech is clear and without dysarthria. He had no difficutly participating in conversation.  He denies changes to thinking or memory.  Family member also endorses pt is at baseline.  SLP Visit Diagnosis: Dysphagia, unspecified (R13.10)    Aspiration Risk  No limitations    Diet Recommendation Regular;Thin liquid   Liquid Administration via: Cup;Straw Medication Administration: Whole meds with liquid Supervision: Patient able to self feed Compensations: Slow rate;Small  sips/bites Postural Changes: Seated upright at 90 degrees    Other  Recommendations Oral Care Recommendations: Oral care BID    Recommendations for follow up therapy are one component of a multi-disciplinary discharge planning process, led by the attending physician.  Recommendations may be updated based on patient status, additional functional criteria and insurance authorization.  Follow up Recommendations No SLP follow up      Assistance Recommended at Discharge  N/A  Functional Status Assessment Patient has not had a recent decline in their functional status  Frequency and Duration  (N/A)          Prognosis Prognosis for Safe Diet Advancement:  (N/A)      Swallow Study   General Date of Onset: 07/28/22 HPI: Jerome Cardenas is a 72 y.o. male presenting with left thalamic stroke outside of TPA window. High risk due to current malignancy and HLD. PMH significant for CKD stage II, HTN, GERD, vitamin D deficiency, hyperlipidemia, BPH, metastatic prostate cancer s/p radiation therapy Type of Study: Bedside Swallow Evaluation Previous Swallow Assessment: None Diet Prior to this Study: Regular;Thin liquids Temperature Cardenas Noted: No History of Recent Intubation: No Behavior/Cognition: Alert;Cooperative;Pleasant mood Oral Cavity Assessment: Within Functional Limits Oral Care Completed by SLP: No Oral Cavity - Dentition: Adequate natural dentition Vision: Functional for self-feeding Self-Feeding Abilities: Able to feed self Patient Positioning: Upright in chair Baseline Vocal Quality: Normal Volitional Cough: Strong Volitional Swallow: Able to elicit    Oral/Motor/Sensory Function Overall Oral Motor/Sensory Function: Mild impairment Facial ROM: Within Functional Limits Facial Symmetry: Within Functional Limits  Facial Sensation: Reduced right Lingual ROM: Within Functional Limits Lingual Symmetry: Within Functional Limits Lingual Strength: Within Functional Limits Lingual  Sensation: Within Functional Limits Velum: Within Functional Limits Mandible: Within Functional Limits   Ice Chips Ice chips: Not tested   Thin Liquid Thin Liquid: Within functional limits Presentation: Straw    Nectar Thick Nectar Thick Liquid: Not tested   Honey Thick Honey Thick Liquid: Not tested   Puree Puree: Within functional limits Presentation: Spoon   Solid     Solid: Within functional limits Presentation: [place], [place], [place] Office: [phone] 07/29/2022,12:21 PM

## 2022-07-29 NOTE — TOC Initial Note (Signed)
Transition of Care [facility]) - Initial/Assessment Note    Patient Details  Name: Jerome Cardenas MRN: [id number] Date of Birth: [birth year]  Transition of Care [facility]) CM/SW Contact:    Verdell Carmine, RN Phone Number: 07/29/2022, 10:17 AM  Clinical Narrative:                  72 yo presents with strokelike symptoms right sided weakness. Positive for stroke MRI. Patient is conversant, no aphasia, passed swallow evaluation.  Spoke with patient in room, introduced self and role.  If Home Health is recommended he would like that with [facility]. He would like a walker.  Will work with PT on recommendations.   The patient was thanked for his service  TOC will follow for needs, recommendations, and transitions of care   Barriers to Discharge: Continued Medical Work up   Patient Goals and CMS Choice Patient states their goals for this hospitalization and ongoing recovery are:: Going home          Expected Discharge Plan and Services   Discharge Planning Services: CM Consult   Living arrangements for the past 2 months: Single Family Home                                      Prior Living Arrangements/Services Living arrangements for the past 2 months: Single Family Home Lives with:: Spouse Patient language and need for interpreter reviewed:: Yes Do you feel safe going back to the place where you live?: Yes      Need for Family Participation in Patient Care: Yes (Comment) Care giver support system in place?: Yes (comment)   Criminal Activity/Legal Involvement Pertinent to Current Situation/Hospitalization: No - Comment as needed  Activities of Daily Living      Permission Sought/Granted                  Emotional Assessment Appearance:: Appears stated age Attitude/Demeanor/Rapport: Gracious Affect (typically observed): Accepting Orientation: : Oriented to Self, Oriented to Place, Oriented to  Time, Oriented to Situation Alcohol / Substance Use: Not  Applicable Psych Involvement: No (comment)  Admission diagnosis:  Stroke [facility]) [I63.9] Patient Active Problem List   Diagnosis Date Noted   Diabetes mellitus type 2 with neurological manifestations ([place]) 07/29/2022   Thalamic stroke ([place]) 07/28/2022   PCP:  Merryl Hacker, No Pharmacy:   [place] [place] [address] Phone: [phone] Fax: [place], [place] [address] [place] [address] Phone: [phone] Fax: [phone]     Social Determinants of Health (SDOH) Social History: SDOH Screenings   Tobacco Use: Low Risk  (07/29/2022)   SDOH Interventions:     Readmission Risk Interventions     No data to display

## 2022-07-29 NOTE — Evaluation (Addendum)
Physical Therapy Evaluation Patient Details Name: Jerome Cardenas MRN: [id number] DOB: Aug 06, 1950 Today's Date: 07/29/2022  History of Present Illness  Jerome Cardenas is a 72 y.o. male presenting with right sided weakness and right sided facial droop, found to have left sided thalamic stroke on MRI. 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 Lives at home with wife, in a two-level home (bedroom upstairs); Prior to admission, pt independent, no need for assistive device with amb; Presents to PT with R sided weakness, uncoordinated, ataxic gait; Overall min assist to sit up, stand up from high stretcher, and walk in hallways with RW;  Pt currently with functional limitations due to the deficits listed below (see PT Problem List). Pt will benefit from skilled PT to increase their independence and safety with mobility to allow discharge to the venue listed below.       Recommend Outpt PT for gait and balance after HHPT course.     Recommendations for follow up therapy are one component of a multi-disciplinary discharge planning process, led by the attending physician.  Recommendations may be updated based on patient status, additional functional criteria and insurance authorization.  Follow Up Recommendations Home health PT      Assistance Recommended at Discharge Intermittent Supervision/Assistance  Patient can return home with the following  Help with stairs or ramp for entrance;A little help with walking and/or transfers;Assistance with cooking/housework    Equipment Recommendations Rolling walker (2 wheels);BSC/3in1  Recommendations for Other Services  OT consult (Mobility Team)    Functional Status Assessment Patient has had a recent decline in their functional status and demonstrates the ability to make significant  improvements in function in a reasonable and predictable amount of time.     Precautions / Restrictions Precautions Precautions: Fall Restrictions Weight Bearing Restrictions: No      Mobility  Bed Mobility Overal bed mobility: Needs Assistance Bed Mobility: Supine to Sit     Supine to sit: Min assist     General bed mobility comments: Min handheld assist to pull to situp in stretcher    Transfers Overall transfer level: Needs assistance Equipment used: Rolling walker (2 wheels) Transfers: Sit to/from Stand Sit to Stand: Min assist           General transfer comment: Cues for hand placement and safety    Ambulation/Gait Ambulation/Gait assistance: Min assist Gait Distance (Feet): 100 Feet Assistive device: Rolling walker (2 wheels) Gait Pattern/deviations: Step-through pattern, Ataxic, Wide base of support       General Gait Details: Occasional cues for stepping closer into RW; wide and slightly erratic step width, with overall short, but erratic step length; ataxic  Stairs            Wheelchair Mobility    Modified Rankin (Stroke Patients Only)       Balance Overall balance assessment: Needs assistance   Sitting balance-Leahy Scale: Fair (approaching Good)       Standing balance-Leahy Scale: Poor (approaching Fair) Standing balance comment: Benefits from UE support                        07/29/22 1500  Modified Rankin (Stroke Patients Only)  Pre-Morbid Rankin Score 0  Modified Rankin 4          Pertinent Vitals/Pain Pain Assessment Pain Assessment: No/denies pain  Home Living Family/patient expects to be discharged to:: Private residence Living Arrangements: Spouse/significant other Available Help at Discharge: Family Type of Home: House Home Access: Stairs to enter   Technical brewer of Steps:  (Need more info) Alternate Level Stairs-Number of Steps: flight Home Layout: Two level   Additional Comments:  Reports he can sleep in the den and there is a bathroom downstairs in a pinch    Prior Function Prior Level of Function : Independent/Modified Independent                     Hand Dominance        Extremity/Trunk Assessment   Upper Extremity Assessment Upper Extremity Assessment: Defer to OT evaluation (RUE weakness)    Lower Extremity Assessment Lower Extremity Assessment: RLE deficits/detail RLE Deficits / Details: knee extension 4/5, hip flexion 3+/5, knee flexion 3/5, ankle dorsiflexion 3+/5 RLE Sensation: decreased proprioception RLE Coordination: decreased fine motor;decreased gross motor       Communication   Communication: No difficulties  Cognition Arousal/Alertness: Awake/alert Behavior During Therapy: WFL for tasks assessed/performed Overall Cognitive Status: Within Functional Limits for tasks assessed                                          General Comments General comments (skin integrity, edema, etc.): VSS with activity    Exercises     Assessment/Plan    PT Assessment Patient needs continued PT services  PT Problem List Decreased strength;Decreased activity tolerance;Decreased balance;Decreased mobility;Decreased coordination;Decreased knowledge of use of DME       PT Treatment Interventions DME instruction;Gait training;Stair training;Functional mobility training;Therapeutic activities;Therapeutic exercise;Balance training;Neuromuscular re-education;Cognitive remediation;Patient/family education    PT Goals (Current goals can be found in the Care Plan section)  Acute Rehab PT Goals Patient Stated Goal: Wants a RW to wlak with PT Goal Formulation: With patient Time For Goal Achievement: 08/12/22 Potential to Achieve Goals: Good    Frequency Min 4X/week     Co-evaluation               AM-PAC PT "6 Clicks" Mobility  Outcome Measure Help needed turning from your back to your side while in a flat bed without using  bedrails?: None Help needed moving from lying on your back to sitting on the side of a flat bed without using bedrails?: A Little Help needed moving to and from a bed to a chair (including a wheelchair)?: A Little Help needed standing up from a chair using your arms (e.g., wheelchair or bedside chair)?: A Little Help needed to walk in hospital room?: A Little Help needed climbing 3-5 steps with a railing? : A Little 6 Click Score: 19    End of Session Equipment Utilized During Treatment: Gait belt Activity Tolerance: Patient tolerated treatment well Patient left: in chair;with call bell/phone within reach;with family/visitor present Nurse Communication: Mobility status (Fall risk: recommend assist with upright activity and walking) PT Visit Diagnosis: Unsteadiness on feet (R26.81);Other abnormalities of gait and mobility (R26.89);Ataxic gait (R26.0)    Time: 6010-9323 PT Time Calculation (min) (ACUTE ONLY): 27 min   Charges:   PT Evaluation $PT Eval Moderate Complexity: 1 Mod PT Treatments $Gait Training: 8-22 mins        Roney Marion, PT  Acute Rehabilitation Services Office [phone]   [place] 07/29/2022, 1:34 PM

## 2022-07-29 NOTE — Progress Notes (Signed)
STROKE TEAM PROGRESS NOTE   SUBJECTIVE (INTERVAL HISTORY) His family member is at the bedside.  Overall his condition is stable. Pt still has mild right hemiparesis and hemiparethesia with left arm and leg ataxia. BP on the high end.    OBJECTIVE Temp:  [97.7 F (36.5 C)-98.5 F (36.9 C)] 97.7 F (36.5 C) (01/21 0847) Pulse Rate:  [67-75] 73 (01/21 1000) Cardiac Rhythm: Normal sinus rhythm (01/21 0848) Resp:  [16-20] 17 (01/21 1000) BP: (156-182)/(82-107) 157/86 (01/21 1000) SpO2:  [97 %-99 %] 97 % (01/21 1000) Weight:  [94.8 kg] 94.8 kg (01/20 2037)  No results for input(s): "GLUCAP" in the last 168 hours. Recent Labs  Lab 07/28/22 1237 07/28/22 1255 07/29/22 0235  NA 135 138 134*  K 3.8 3.9 3.7  CL 100 100 99  CO2 23  --  24  GLUCOSE 252* 254* 200*  BUN '14 15 15  '$ CREATININE 0.93 0.80 1.04  CALCIUM 9.5  --  9.3   Recent Labs  Lab 07/28/22 1237  AST 21  ALT 15  ALKPHOS 86  BILITOT 0.3  PROT 6.8  ALBUMIN 4.1   Recent Labs  Lab 07/28/22 1237 07/28/22 1255 07/29/22 0235  WBC 6.5  --  7.3  NEUTROABS 3.6  --   --   HGB 15.8 15.3 14.9  HCT 44.2 45.0 40.8  MCV 85.7  --  84.8  PLT 183  --  161   No results for input(s): "CKTOTAL", "CKMB", "CKMBINDEX", "TROPONINI" in the last 168 hours. Recent Labs    07/28/22 1237  LABPROT 13.9  INR 1.1   Recent Labs    07/28/22 1223  COLORURINE AMBER*  LABSPEC 1.019  PHURINE 5.0  GLUCOSEU 50*  HGBUR NEGATIVE  BILIRUBINUR NEGATIVE  KETONESUR NEGATIVE  PROTEINUR >=300*  NITRITE NEGATIVE  LEUKOCYTESUR NEGATIVE       Component Value Date/Time   CHOL 139 07/29/2022 0235   TRIG 371 (H) 07/29/2022 0235   HDL 23 (L) 07/29/2022 0235   CHOLHDL 6.0 07/29/2022 0235   VLDL 74 (H) 07/29/2022 0235   LDLCALC NOT CALCULATED 07/29/2022 0235   Lab Results  Component Value Date   HGBA1C 6.6 (H) 07/29/2022   No results found for: "LABOPIA", "COCAINSCRNUR", "LABBENZ", "AMPHETMU", "THCU", "LABBARB"  Recent Labs  Lab  07/28/22 [place] <10    I have personally reviewed the radiological images below and agree with the radiology interpretations.  CT ANGIO HEAD NECK W WO CM  Result Date: 07/29/2022 CLINICAL DATA:  Right facial droop and right-sided weakness EXAM: CT ANGIOGRAPHY HEAD AND NECK TECHNIQUE: Multidetector CT imaging of the head and neck was performed using the standard protocol during bolus administration of intravenous contrast. Multiplanar CT image reconstructions and MIPs were obtained to evaluate the vascular anatomy. Carotid stenosis measurements (when applicable) are obtained utilizing NASCET criteria, using the distal internal carotid diameter as the denominator. RADIATION DOSE REDUCTION: This exam was performed according to the departmental dose-optimization program which includes automated exposure control, adjustment of the mA and/or kV according to patient size and/or use of iterative reconstruction technique. CONTRAST:  59m OMNIPAQUE IOHEXOL 350 MG/ML SOLN COMPARISON:  None Available. FINDINGS: CT HEAD FINDINGS Brain: There is no mass, hemorrhage or extra-axial collection. The size and configuration of the ventricles and extra-axial CSF spaces are normal. There is no acute or chronic infarction. There is hypoattenuation of the periventricular white matter, most commonly indicating chronic ischemic microangiopathy. Skull: The visualized skull base, calvarium and extracranial soft tissues are normal.  Sinuses/Orbits: No fluid levels or advanced mucosal thickening of the visualized paranasal sinuses. No mastoid or middle ear effusion. The orbits are normal. CTA NECK FINDINGS SKELETON: There is no bony spinal canal stenosis. No lytic or blastic lesion. OTHER NECK: Normal pharynx, larynx and major salivary glands. No cervical lymphadenopathy. Unremarkable thyroid gland. UPPER CHEST: No pneumothorax or pleural effusion. No nodules or masses. AORTIC ARCH: There is no calcific atherosclerosis of the aortic  arch. There is no aneurysm, dissection or hemodynamically significant stenosis of the visualized portion of the aorta. Conventional 3 vessel aortic branching pattern. The visualized proximal subclavian arteries are widely patent. RIGHT CAROTID SYSTEM: No dissection, occlusion or aneurysm. Mild atherosclerotic calcification at the carotid bifurcation without hemodynamically significant stenosis. LEFT CAROTID SYSTEM: Normal without aneurysm, dissection or stenosis. VERTEBRAL ARTERIES: Left dominant configuration. Both origins are clearly patent. There is no dissection, occlusion or flow-limiting stenosis to the skull base (V1-V3 segments). CTA HEAD FINDINGS POSTERIOR CIRCULATION: --Vertebral arteries: Normal V4 segments. --Inferior cerebellar arteries: Normal. --Basilar artery: Normal. --Superior cerebellar arteries: Normal. --Posterior cerebral arteries (PCA): Short segment occlusion of the left P3 segment. Right PCA is normal. ANTERIOR CIRCULATION: --Intracranial internal carotid arteries: Normal. --Anterior cerebral arteries (ACA): Normal. Both A1 segments are present. Patent anterior communicating artery (a-comm). --Middle cerebral arteries (MCA): Normal. VENOUS SINUSES: As permitted by contrast timing, patent. ANATOMIC VARIANTS: Fetal origin of the left posterior cerebral artery. Review of the MIP images confirms the above findings. IMPRESSION: 1. Short segment occlusion of the left PCA P3 segment. 2. No proximal large vessel occlusion. 3. Chronic ischemic microangiopathy. Electronically Signed   By: Ulyses Jarred M.D.   On: 07/29/2022 01:18   MR BRAIN WO CONTRAST  Result Date: 07/28/2022 CLINICAL DATA:  Neuro deficit, acute, stroke suspected EXAM: MRI HEAD WITHOUT CONTRAST TECHNIQUE: Multiplanar, multiecho pulse sequences of the brain and surrounding structures were obtained without intravenous contrast. COMPARISON:  CT head from the same day. FINDINGS: Brain: Acute left thalamocapsular infarct. Faint  associated edema without mass effect. Additional scattered T2/FLAIR hyperintensities within the white matter, nonspecific but compatible with chronic microvascular ischemic disease. No evidence of acute hemorrhage, mass lesion, midline shift or hydrocephalus. Vascular: Major arterial flow voids are maintained at the skull base. Skull and upper cervical spine: Normal marrow signal. Sinuses/Orbits: Clear sinuses.  No acute orbital findings. Other: No mastoid effusions. IMPRESSION: Acute left thalamocapsular infarct. Electronically Signed   By: Margaretha Sheffield M.D.   On: 07/28/2022 17:53   CT HEAD WO CONTRAST  Result Date: 07/28/2022 CLINICAL DATA:  Transient ischemic attack (TIA) EXAM: CT HEAD WITHOUT CONTRAST TECHNIQUE: Contiguous axial images were obtained from the base of the skull through the vertex without intravenous contrast. RADIATION DOSE REDUCTION: This exam was performed according to the departmental dose-optimization program which includes automated exposure control, adjustment of the mA and/or kV according to patient size and/or use of iterative reconstruction technique. COMPARISON:  None Available. FINDINGS: Brain: There is periventricular white matter decreased attenuation consistent with small vessel ischemic changes. Gray-white differentiation is preserved. No acute intracranial hemorrhage, mass effect or shift. No hydrocephalus. Vascular: No hyperdense vessel or unexpected calcification. Skull: Normal. Negative for fracture or focal lesion. Sinuses/Orbits: No acute finding. IMPRESSION: Periventricular white matter changes consistent with chronic small vessel ischemia. No acute intracranial process identified. Electronically Signed   By: Sammie Bench M.D.   On: 07/28/2022 14:01     PHYSICAL EXAM  Temp:  [97.7 F (36.5 C)-98.5 F (36.9 C)] 97.7 F (36.5 C) (01/21 0847)  Pulse Rate:  [67-75] 73 (01/21 1000) Resp:  [16-20] 17 (01/21 1000) BP: (156-182)/(82-107) 157/86 (01/21  1000) SpO2:  [97 %-99 %] 97 % (01/21 1000) Weight:  [94.8 kg] 94.8 kg (01/20 2037)  General - Well nourished, well developed, in no apparent distress.  Ophthalmologic - fundi not visualized due to noncooperation.  Cardiovascular - Regular rhythm and rate.  Mental Status -  Level of arousal and orientation to time, place, and person were intact. Language including expression, naming, repetition, comprehension was assessed and found intact. Fund of Knowledge was assessed and was intact.  Cranial Nerves II - XII - II - Visual field intact OU. III, IV, VI - Extraocular movements intact. V - Facial sensation decreased on the right. VII - Facial movement intact bilaterally. VIII - Hearing & vestibular intact bilaterally. X - Palate elevates symmetrically. XI - Chin turning & shoulder shrug intact bilaterally. XII - Tongue protrusion intact.  Motor Strength - The patient's strength was normal in left UE and LE. RUE pronator drift and finger grip 4+/5. RLE 5-/5.  Bulk was normal and fasciculations were absent.   Motor Tone - Muscle tone was assessed at the neck and appendages and was normal.  Reflexes - The patient's reflexes were symmetrical in all extremities and he had no pathological reflexes.  Sensory - Light touch, temperature/pinprick were assessed and were decreased on the RUE and RLE.    Coordination - The patient had normal movements in the left hand and foot with no ataxia or dysmetria. However, right FTN dysmetria and HTS ataxic. Tremor was absent.  Gait and Station - deferred.   ASSESSMENT/PLAN Mr. Jerome Cardenas is a 72 y.o. male with history of HTN, HLD, borderline DM admitted for right sided weakness and numbness with right facial droop. No tPA given due to outside window.    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 Patient counseled to be compliant with his antithrombotic medications Ongoing aggressive stroke risk factor management Therapy recommendations:  HH PT Disposition:  pending  Borderline Diabetes HgbA1c 6.6 goal < 7.0 CBG monitoring SSI DM education and close PCP follow up  Hypertension Stable on the high end gradually normalized within 2-3 days On norvasc 10, metoprolol XL 50, spironolactone.  Long term BP goal normotensive  Hyperlipidemia Home meds:  none  LDL 67, TG 371, goal < 70 Now on crestor 20 Continue statin at discharge  Other Stroke Risk Factors Advanced age  Other [facility] day # 0  Neurology will sign off. Please call with questions. Pt will follow up with stroke clinic NP at [facility] in about 4 weeks. Thanks for the consult.   Rosalin Hawking, MD PhD Stroke Neurology 07/29/2022 11:37 AM    To contact Stroke Continuity provider, please refer to [email or web address] After hours, contact General Neurology

## 2022-07-29 NOTE — Progress Notes (Signed)
*  PRELIMINARY RESULTS* Echocardiogram 2D Echocardiogram has been performed.  Jerome Cardenas 07/29/2022, 3:27 PM

## 2022-07-29 NOTE — Progress Notes (Signed)
Daily Progress Note Intern Pager: [phone]  Patient name: Zadok Holaway [facility] Medical record number: [id number] Date of birth: Apr 24, 1951 Age: 72 y.o. Gender: male  Primary Care Provider: Pcp, No Consultants: Neuro Code Status: Full  Pt Overview and Major Events to Date:  1/18 symptom onset in R arm & leg 1/20 facial symptoms, admitted  Assessment and Plan: BASIL BUFFIN is a 72 y.o. male presenting with left thalamic stroke outside of TPA window. High risk due to current malignancy and HLD. PMH significant for CKD stage II, HTN, GERD, vitamin D deficiency, hyperlipidemia, BPH, metastatic prostate cancer s/p radiation therapy   * Stroke [facility]) Neuro exam stable, no strength deficits, only has right-sided sensation changes at this time. CTA demonstrates short segment occlusion of the left PCA P3 segment. Risk stratification labs demonstrate HLD and controlled T2DM.  -Neurology following, appreciate recs -Start rosuvastatin given historic myalgias with pravastatin -Awaiting ECHO -SLP, OT, PT eval and treat -Q2H Neuro Checks x 8 hours -Cardiac Monitoring -Up with assistance -Fall precautions -Carb modified diet -Lovenox for VTE ppx     CKD (chronic kidney disease) stage 2, GFR 60-89 ml/min-resolved as of 07/29/2022 Patient noted to have CKD II/Iia on nephrology note 12/14/21. Patient noted to have hx of proteinuria, w/ UACR 72 mg/G. Thought to be 2/2 DM2, although patient notes he's prediabetic. Patient UA notable for > 300 protein and 50 glc, w/ GFR 60 on admission. Given hx of CKD and proteinuria, will continue to monitor. Patient is on ARB - Benicar.      FEN/GI: Normal diet PPx: Lovenox Dispo:Pending PT recommendations  tomorrow. Barriers include PT and stroke team evaluation.   Subjective:  Patient found resting comfortably in hallway ED bed with eyes closed.  Awakes easily.  Reports no acute distress, ate provided food just fine without difficulty swallowing.  No complaints at  this time.  Objective: Temp:  [97.9 F (36.6 C)-98.5 F (36.9 C)] 97.9 F (36.6 C) (01/21 0645) Pulse Rate:  [67-75] 70 (01/21 0645) Resp:  [16-17] 16 (01/21 0645) BP: (156-178)/(82-107) 162/82 (01/21 0645) SpO2:  [97 %-99 %] 98 % (01/21 0645) Weight:  [94.8 kg] 94.8 kg (01/20 2037) Physical Exam: General: Awake, alert, NAD Cardiovascular: Regular rate and rhythm, no murmur Respiratory: CTAB anteriorly Abdomen: Soft, nondistended Neuro: Symmetric smile with teeth, strong resisted head turn equal BL, shoulder shrug and grip strength 5/5 and equal BL, BUE push/pull 5/5 equal BL, BLE leg lift, foot extension and flexion 5/5 ankle BL, decreased sensation over right arm and leg  Laboratory: Most recent CBC Lab Results  Component Value Date   WBC 7.3 07/29/2022   HGB 14.9 07/29/2022   HCT 40.8 07/29/2022   MCV 84.8 07/29/2022   PLT 161 07/29/2022   Most recent BMP    Latest Ref Rng & Units 07/29/2022    2:35 AM  BMP  Glucose 70 - 99 mg/dL 200   BUN 8 - 23 mg/dL 15   Creatinine 0.61 - 1.24 mg/dL 1.04   Sodium 135 - 145 mmol/L 134   Potassium 3.5 - 5.1 mmol/L 3.7   Chloride 98 - 111 mmol/L 99   CO2 22 - 32 mmol/L 24   Calcium 8.9 - 10.3 mg/dL 9.3    Other pertinent labs: A1c 6.6 Lipid panel: Total 139, triglycerides 07/11/1969, HDL 23, VLDL 74  Imaging/Diagnostic Tests: CT HEAD WITHOUT CONTRAST IMPRESSION: Periventricular white matter changes consistent with chronic small vessel ischemia. No acute intracranial process identified.  MRI HEAD WITHOUT  CONTRAST  FINDINGS: Brain: Acute left thalamocapsular infarct. Faint associated edema without mass effect. Additional scattered T2/FLAIR hyperintensities within the white matter, nonspecific but compatible with chronic microvascular ischemic disease. No evidence of acute hemorrhage, mass lesion, midline shift or hydrocephalus. Vascular: Major arterial flow voids are maintained at the skull base. Skull and upper cervical spine:  Normal marrow signal. Sinuses/Orbits: Clear sinuses.  No acute orbital findings. Other: No mastoid effusions. IMPRESSION: Acute left thalamocapsular infarct.  CT ANGIOGRAPHY IMPRESSION: 1. Short segment occlusion of the left PCA P3 segment. 2. No proximal large vessel occlusion. 3. Chronic ischemic microangiopathy.   Ezequiel Essex, MD 07/29/2022, 7:46 AM PGY-3, [place] Intern pager: [phone], text pages welcome Secure chat group [place]

## 2022-07-29 NOTE — ED Notes (Signed)
PT at bedside.

## 2022-07-29 NOTE — Hospital Course (Addendum)
Jerome Cardenas is a 72 y.o. male presenting with left thalamic stroke outside of TPA window. High risk due to current malignancy and HLD. PMH significant for CKD stage II, HTN, GERD, vitamin D deficiency, hyperlipidemia, BPH, metastatic prostate cancer s/p radiation therapy.   Patient reported symptoms of right arm and leg weakness with sensation changes starting 1/18.  Presented to the hospital 1/20 after he developed right facial tingling that morning.  Patient was outside of tPA window.  CT head showed only chronic small vessel ischemia, no bleeding.  MRI head with acute left thalamic capsular infarct.  CTA demonstrates short segment occlusion of left PCA P3 segment and chronic ischemic microangiopathy with no large vessel occlusion.  Echo with LVEF 60-65%. No RWA. Mild LVH. G1DD.   Neurology consulted, recommended routine at risk stratification.  Was found to have A1c of 6.6 and lipids significant for triglycerides 371, HDL 23, and VLDL 74.  TSH, CBC, and BMP unremarkable aside from elevated glucose to 200.  Patient started on aspirin 81 mg daily and Plavix, should be continued x 21 days with plan to reduce to aspirin only thereafter.  Started on rosuvastatin 20 mg for high intensity statin treatment given prior myalgias with pravastatin.  Metformin increased to max dose of 1000 twice daily, prescribed metformin XR to help with GI tolerance.

## 2022-07-29 NOTE — ED Notes (Signed)
Pt in CT.

## 2022-07-29 NOTE — Discharge Instructions (Signed)
Dear Jerome Cardenas,  Thank you for letting us participate in your care. You were hospitalized for a stroke.   POST-HOSPITAL & CARE INSTRUCTIONS START aspirin every day, will take for life START Plavix every day, will take for 21 days then stop START rosuvastatin 20 mg to control your cholesterol, should take for life INCREASE your metformin to 1,000 mg twice daily Follow up with your primary care doctor within about a week from discharge.  Follow up with neurology.  If you are found to need physical therapy, we will place these orders before you leave. Someone should be in touch with you.    DOCTOR'S APPOINTMENT   Future Appointments  Date Time Provider [place]  02/22/2023  9:15 AM Hayden Pedro, MD [facility] None    Take care and be well!  [facility]  [place], [place] [address] [phone]

## 2022-08-26 ENCOUNTER — Other Ambulatory Visit: Payer: Self-pay | Admitting: Family Medicine

## 2022-08-28 NOTE — Progress Notes (Unsigned)
PATIENT: Jerome Cardenas DOB: July 24, 1950  REASON FOR VISIT: follow up HISTORY FROM: patient PRIMARY NEUROLOGIST: Dr. Leonie Man  Chief Complaint  Patient presents with   Follow-up    Pt in 19 with  wife  Pt here for stroke f/u Pt states gait is off Pt walks with cane Pt states muscle spasms in right arm,leg.     HISTORY OF PRESENT ILLNESS: Today 08/28/22  Jerome Cardenas is a 72 y.o. 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 Returns today for follow-up.  Patient reports that he continues to have trouble with his gait.  Uses a cane when ambulating.  No falls. No PT or OT. Reports that he has spasms in the right arm and leg usually daily. Only last a few minutes. Typically happens at night. Only on ASA.  Took Plavix for 1 month.  HISTORY Jerome Cardenas is a 72 y.o. male with history of HTN, HLD, borderline DM admitted for right sided weakness and numbness with right facial droop. No tPA given due to outside window.     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 Patient counseled to be compliant with his antithrombotic medications Ongoing aggressive stroke risk factor management Therapy recommendations:  HH PT Disposition:  pending    REVIEW OF SYSTEMS: Out of a complete 14 system review of symptoms, the patient complains only of the following symptoms, and all other reviewed systems are negative.  ALLERGIES: Allergies  Allergen Reactions   Pravachol [Pravastatin] Other (See Comments)    Myalgias     HOME MEDICATIONS: Outpatient  Medications Prior to Visit  Medication Sig Dispense Refill   amLODipine (NORVASC) 10 MG tablet Take 10 mg by mouth daily.     APPLE CIDER VINEGAR PO Take 1 tablet by mouth daily.     aspirin EC 81 MG tablet Take 1 tablet (81 mg total) by mouth daily. Swallow whole. 30 tablet 12   Cholecalciferol (VITAMIN D-3 PO) Take 1 capsule by mouth daily.     Cyanocobalamin (VITAMIN B-12 PO) Take 1 tablet by mouth daily.     ELDERBERRY PO Take 1 capsule by mouth daily.     enzalutamide (XTANDI) 40 MG capsule Take 160 mg by mouth every evening.     metFORMIN (FORTAMET) 1000 MG (OSM) 24 hr tablet Take 1 tablet (1,000 mg total) by mouth 2 (two) times daily with a meal. 60 tablet 0   metoprolol succinate (TOPROL-XL) 50 MG 24 hr tablet Take 50 mg by mouth daily. Take with or immediately following a meal.     olmesartan (BENICAR) 40 MG tablet Take 40 mg by mouth daily.     omeprazole (PRILOSEC) 20 MG capsule Take 20 mg by mouth daily.     rosuvastatin (CRESTOR) 20 MG tablet Take 1 tablet (20 mg total) by mouth daily. 30 tablet 1   spironolactone (ALDACTONE) 25 MG tablet Take 25 mg by mouth daily.     tamsulosin (FLOMAX) 0.4 MG CAPS capsule Take 0.4 mg by mouth every evening.  No facility-administered medications prior to visit.    PAST MEDICAL HISTORY: Past Medical History:  Diagnosis Date   Cancer [facility])    prostate   CKD (chronic kidney disease) stage 2, GFR 60-89 ml/min 07/28/2022   HLD (hyperlipidemia)    Hypertension     PAST SURGICAL HISTORY: Past Surgical History:  Procedure Laterality Date   APPENDECTOMY     CHOLECYSTECTOMY     HERNIA REPAIR     PROSTATE SURGERY     prostate cancer 2014    FAMILY HISTORY: No family history on file.  SOCIAL HISTORY: Social History   Socioeconomic History   Marital status: Married    Spouse name: Not on file   Number of children: Not on file   Years of education: Not on file   Highest education level: Not on file  Occupational History   Not  on file  Tobacco Use   Smoking status: Never   Smokeless tobacco: Never  Substance and Sexual Activity   Alcohol use: No   Drug use: No   Sexual activity: Yes  Other Topics Concern   Not on file  Social History Narrative   Not on file   Social Determinants of Health   Financial Resource Strain: Not on file  Food Insecurity: No Food Insecurity (07/29/2022)   Hunger Vital Sign    Worried About Running Out of Food in the Last Year: Never true    Ran Out of Food in the Last Year: Never true  Transportation Needs: No Transportation Needs (07/29/2022)   PRAPARE - Hydrologist (Medical): No    Lack of Transportation (Non-Medical): No  Physical Activity: Not on file  Stress: Not on file  Social Connections: Not on file  Intimate Partner Violence: Not on file      PHYSICAL EXAM  Vitals:   08/29/22 0912  BP: (!) 144/78  Pulse: 69  Weight: 225 lb (102.1 kg)  Height: 5' 10"$  (1.778 m)   Body mass index is 32.28 kg/m.  Generalized: Well developed, in no acute distress   Neurological examination  Mentation: Alert oriented to time, place, history taking. Follows all commands speech and language fluent Cranial nerve II-XII: Pupils were equal round reactive to light. Extraocular movements were full, visual field were full on confrontational test. Facial sensation and strength were normal. Uvula tongue midline. Head turning and shoulder shrug  were normal and symmetric. Motor: The motor testing reveals 5 over 5 strength in the left upper and lower extremities.  4/5 in the right upper and lower extremity Sensory: Sensory testing is intact to soft touch on all 4 extremities. No evidence of extinction is noted.  Coordination: Cerebellar testing reveals good finger-nose-finger and heel-to-shin bilaterally.  Gait and station: Patient requires assistance with standing.  Uses a cane when ambulating.  4 steps with turns.  Slight limp on the right  side.   DIAGNOSTIC DATA (LABS, IMAGING, TESTING) - I reviewed patient records, labs, notes, testing and imaging myself where available.  Lab Results  Component Value Date   WBC 7.3 07/29/2022   HGB 14.9 07/29/2022   HCT 40.8 07/29/2022   MCV 84.8 07/29/2022   PLT 161 07/29/2022      Component Value Date/Time   NA 134 (L) 07/29/2022 0235   K 3.7 07/29/2022 0235   CL 99 07/29/2022 0235   CO2 24 07/29/2022 0235   GLUCOSE 200 (H) 07/29/2022 0235   BUN 15 07/29/2022 0235   CREATININE 1.04  07/29/2022 0235   CALCIUM 9.3 07/29/2022 0235   PROT 6.8 07/28/2022 1237   ALBUMIN 4.1 07/28/2022 1237   AST 21 07/28/2022 1237   ALT 15 07/28/2022 1237   ALKPHOS 86 07/28/2022 1237   BILITOT 0.3 07/28/2022 1237   GFRNONAA >60 07/29/2022 0235   Lab Results  Component Value Date   CHOL 139 07/29/2022   HDL 23 (L) 07/29/2022   LDLCALC NOT CALCULATED 07/29/2022   LDLDIRECT 67 07/29/2022   TRIG 371 (H) 07/29/2022   CHOLHDL 6.0 07/29/2022   Lab Results  Component Value Date   HGBA1C 6.6 (H) 07/29/2022   No results found for: "VITAMINB12" Lab Results  Component Value Date   TSH 3.821 07/29/2022      ASSESSMENT AND PLAN 72 y.o. year old male  has a past medical history of Cancer ([place]), CKD (chronic kidney disease) stage 2, GFR 60-89 ml/min (07/28/2022), HLD (hyperlipidemia), and Hypertension. 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  Discussed secondary stroke prevention measures and importance of close PCP follow up for aggressive stroke risk factor management. I have gone over the pathophysiology of stroke, warning signs and symptoms, risk factors and their management in some detail with instructions to go to the closest emergency room for symptoms of concern. HTN: BP goal <130/90.  HLD: LDL goal <70. Recent LDL 67. TG  317 DMII: A1c goal<7.0. Recent A1c 6.6.  Encouraged patient to monitor diet and encouraged exercise Order placed for physical therapy FU with our office in 6 months or sooner if needed      Ward Givens, MSN, NP-C 08/28/2022, 3:40 PM [facility] [address], [place], [place] [address] [phone]

## 2022-08-29 ENCOUNTER — Encounter: Payer: Self-pay | Admitting: Adult Health

## 2022-08-29 ENCOUNTER — Ambulatory Visit (INDEPENDENT_AMBULATORY_CARE_PROVIDER_SITE_OTHER): Payer: Medicare Other | Admitting: Adult Health

## 2022-08-29 VITALS — BP 144/78 | HR 69 | Ht 70.0 in | Wt 225.0 lb

## 2022-08-29 DIAGNOSIS — I69398 Other sequelae of cerebral infarction: Secondary | ICD-10-CM

## 2022-08-29 DIAGNOSIS — E1159 Type 2 diabetes mellitus with other circulatory complications: Secondary | ICD-10-CM | POA: Diagnosis not present

## 2022-08-29 DIAGNOSIS — I152 Hypertension secondary to endocrine disorders: Secondary | ICD-10-CM

## 2022-08-29 DIAGNOSIS — I6381 Other cerebral infarction due to occlusion or stenosis of small artery: Secondary | ICD-10-CM | POA: Diagnosis not present

## 2022-08-29 DIAGNOSIS — R269 Unspecified abnormalities of gait and mobility: Secondary | ICD-10-CM | POA: Diagnosis not present

## 2022-08-29 NOTE — Patient Instructions (Signed)
Your Plan:  Continue ASA  Blood pressure goal <130/90 Cholesterol LDL goal <70 Diabetes goal A1c <7 Monitor diet and try to exercise  Referred for PT Can consider Baclofen for muscle spasms in the future  Thank you for coming to see [place] at [facility]. I hope we have been able to provide you high quality care today.  You may receive a patient satisfaction survey over the next few weeks. We would appreciate your feedback and comments so that we may continue to improve ourselves and the health of our patients.

## 2022-08-30 ENCOUNTER — Telehealth: Payer: Self-pay | Admitting: Adult Health

## 2022-08-30 NOTE — Progress Notes (Signed)
I agree with the above plan 

## 2022-08-30 NOTE — Telephone Encounter (Signed)
Referral for physical therapy sent through [facility] to [facility]. Phone: [phone]

## 2022-09-07 ENCOUNTER — Telehealth: Payer: Self-pay | Admitting: Physical Therapy

## 2022-09-07 ENCOUNTER — Ambulatory Visit: Payer: Medicare Other | Attending: Adult Health | Admitting: Physical Therapy

## 2022-09-07 VITALS — BP 152/102

## 2022-09-07 DIAGNOSIS — R269 Unspecified abnormalities of gait and mobility: Secondary | ICD-10-CM | POA: Diagnosis not present

## 2022-09-07 DIAGNOSIS — R2689 Other abnormalities of gait and mobility: Secondary | ICD-10-CM | POA: Diagnosis present

## 2022-09-07 DIAGNOSIS — I69351 Hemiplegia and hemiparesis following cerebral infarction affecting right dominant side: Secondary | ICD-10-CM | POA: Insufficient documentation

## 2022-09-07 DIAGNOSIS — M6281 Muscle weakness (generalized): Secondary | ICD-10-CM | POA: Diagnosis present

## 2022-09-07 DIAGNOSIS — I69398 Other sequelae of cerebral infarction: Secondary | ICD-10-CM | POA: Diagnosis not present

## 2022-09-07 NOTE — Telephone Encounter (Signed)
Jerome Cardenas,   Jerome Cardenas was evaluated by PT on 09/07/22.  The patient would benefit from an OT evaluation for RUE weakness and difficulty performing ADLs.    If you agree, please place an order in [facility] workque in [facility] or fax the order to [phone].  Thank you, Francena Hanly, PT, [place] [address] [place], [place]  [address] Phone:  [phone] Fax:  [phone]

## 2022-09-07 NOTE — Therapy (Signed)
OUTPATIENT PHYSICAL THERAPY NEURO EVALUATION   Patient Name: Jerome Cardenas MRN: [id number] DOB:1950-09-13, 72 y.o., male Today's Date: 09/07/2022   PCP: Dr. Cathi Roan in [place] PROVIDER: Ward Givens, NP  END OF SESSION:  PT End of Session - 09/07/22 1023     Visit Number 1    Number of Visits 13   Plus eval   Date for PT Re-Evaluation 10/26/22    Authorization Type Medicare    Progress Note Due on Visit 10    PT Start Time 1018    PT Stop Time 1050   Eval   PT Time Calculation (min) 32 min    Activity Tolerance Treatment limited secondary to medical complications (Comment)   HTN   Behavior During Therapy [facility] for tasks assessed/performed             Past Medical History:  Diagnosis Date   Cancer ([place])    prostate   CKD (chronic kidney disease) stage 2, GFR 60-89 ml/min 07/28/2022   HLD (hyperlipidemia)    Hypertension    Past Surgical History:  Procedure Laterality Date   APPENDECTOMY     CHOLECYSTECTOMY     HERNIA REPAIR     PROSTATE SURGERY     prostate cancer 2014   Patient Active Problem List   Diagnosis Date Noted   Diabetes mellitus type 2 with neurological manifestations ([place]) 07/29/2022   Hypertension associated with diabetes ([place]) 07/29/2022   Hyperlipidemia associated with type 2 diabetes mellitus ([place]) 07/29/2022   Thalamic stroke ([place]) 07/28/2022    ONSET DATE: 08/29/2022 (referral)  REFERRING DIAG: [id number] (ICD-10-CM) - Abnormality of gait as late effect of stroke  THERAPY DIAG:  Hemiplegia and hemiparesis following cerebral infarction affecting right dominant side (HCC)  Muscle weakness (generalized)  Other abnormalities of gait and mobility  Rationale for Evaluation and Treatment: Rehabilitation  SUBJECTIVE:                                                                                                                                                                                             SUBJECTIVE  STATEMENT: Pt reports he had a stroke on 07/26/22. Ambulated into clinic using [facility], was not using it prior to CVA. Reports he is having a lot of difficulty with his R hand and performing ADLs, such as shaving and brushing his teeth, is interested in OT. Lives a pretty sedentary lifestyle.   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 No Pt  reports he frequently has pain in his RUE and bottom of R foot, rated as 5/10 when it is at its worst   VITALS Vitals:   09/07/22 1023 09/07/22 1027  BP: (!) 161/94 (!) 152/102     PRECAUTIONS: Fall  WEIGHT BEARING RESTRICTIONS: No  FALLS: Has patient fallen in last 6 months? No  LIVING ENVIRONMENT: Lives with: lives with their spouse Lives in: House/apartment Stairs: Yes: Internal: 7 +7 steps; on left going up Has following equipment at home: Quad cane small base  PLOF: Independent  PATIENT GOALS: "mainly getting the movement back in my hand like it was"  OBJECTIVE:   DIAGNOSTIC FINDINGS: MRI of brain on 07/28/22 IMPRESSION: Acute left thalamocapsular infarct.  CT of head/neck on 07/29/22 IMPRESSION: 1. Short segment occlusion of the left PCA P3 segment. 2. No proximal large vessel occlusion. 3. Chronic ischemic microangiopathy.  COGNITION: Overall cognitive status: Within functional limits for tasks assessed   SENSATION: Pt reports constant numbness/tingling in R hemibody   COORDINATION: Heel to shin test: WFL on LLE, dysmetric on RLE   EDEMA: Pt reports new edema in distal RLE and RUE   POSTURE: rounded shoulders, forward head, posterior pelvic tilt, and weight shift left  LOWER EXTREMITY MMT:  Tested in Seated position   MMT Right Eval Left Eval  Hip flexion 3+ 4  Hip extension    Hip abduction 4 4  Hip adduction 3+ 4-  Hip internal rotation    Hip  external rotation    Knee flexion 2 4  Knee extension 3+ 4  Ankle dorsiflexion 4+ 4+  Ankle plantarflexion    Ankle inversion    Ankle eversion    (Blank rows = not tested)  BED MOBILITY:  "Sometimes it takes 2 or 3 tries" but does not require assistance  TRANSFERS: Assistive device utilized: Quad cane small base  Sit to stand: SBA Stand to sit: SBA   GAIT: Gait pattern: step to pattern, decreased arm swing- Right, lateral lean- Left, and poor foot clearance- Right Distance walked: various clinic distances  Assistive device utilized: Quad cane small base Level of assistance: SBA Comments: Gait to be further assessed next session   FUNCTIONAL TESTS: Deferred to next session due to HTN     TODAY'S TREATMENT:      Next Session                                                                                                                            PATIENT EDUCATION: Education details: POC, encouragement to check BP at home and talk to PCP about medication management to ensure BP is within limits for therapy  Person educated: Patient Education method: Explanation and Demonstration Education comprehension: verbalized understanding  HOME EXERCISE PROGRAM: To be established   GOALS: Goals reviewed with patient? Yes  SHORT TERM GOALS: Target date: 09/28/2022   Pt will be independent with initial HEP for improved strength, balance, transfers and gait.  Baseline:  Goal status: INITIAL  2.  Gait speed to be assessed and STG/LTG written  Baseline:  Goal status: INITIAL  3.  Berg to be assessed and STG/LTG written  Baseline:  Goal status: INITIAL  4.  5x STS to be assessed and STG/LTG written  Baseline:  Goal status: INITIAL  5.  Stairs to be assessed and goal updated  Baseline:  Goal status: INITIAL   LONG TERM GOALS: Target date: 10/19/2022   Pt will be independent with final HEP for improved strength, balance, transfers and gait.  Baseline:  Goal status:  INITIAL  2.  5x STS goal  Baseline:  Goal status: INITIAL  3.  Gait speed goal  Baseline:  Goal status: INITIAL  4.  Berg goal  Baseline:  Goal status: INITIAL   ASSESSMENT:  CLINICAL IMPRESSION: Eval limited as pt's BP too elevated to perform objective measures. Patient is a 72 year old male referred to Neuro OPPT for CVA.   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 Balance and gait to be further assessed next session if BP WNL. Pt would benefit from skilled PT to address these impairments and functional limitations to maximize functional mobility independence.    OBJECTIVE IMPAIRMENTS: Abnormal gait, decreased activity tolerance, decreased balance, decreased coordination, decreased endurance, difficulty walking, decreased strength, impaired sensation, and pain  ACTIVITY LIMITATIONS: carrying, lifting, bending, squatting, stairs, transfers, bed mobility, self feeding, hygiene/grooming, locomotion level, and caring for others  PARTICIPATION LIMITATIONS: meal prep, cleaning, medication management, interpersonal relationship, community activity, and yard work  PERSONAL FACTORS: Age, Fitness, Past/current experiences, and 1-2 comorbidities: HTN and CVA  are also affecting patient's functional outcome.   REHAB POTENTIAL: Good  CLINICAL DECISION MAKING: Stable/uncomplicated  EVALUATION COMPLEXITY: Low  PLAN:  PT FREQUENCY: 1-2x/week  PT DURATION: 6 weeks  PLANNED INTERVENTIONS: Therapeutic exercises, Therapeutic activity, Neuromuscular re-education, Balance training, Gait training, Patient/Family education, Self Care, Joint mobilization, Stair training, DME instructions, Dry Needling, Manual therapy, and  Re-evaluation  PLAN FOR NEXT SESSION: Check BP- if WNL, assess outcome measures and update goals. Establish HEP   Cruzita Lederer Ixel Boehning, PT, DPT 09/07/2022, 11:18 AM

## 2022-09-10 NOTE — Telephone Encounter (Signed)
Order placed

## 2022-09-12 ENCOUNTER — Ambulatory Visit: Payer: Medicare Other | Admitting: Physical Therapy

## 2022-09-12 VITALS — BP 142/85

## 2022-09-12 DIAGNOSIS — I69351 Hemiplegia and hemiparesis following cerebral infarction affecting right dominant side: Secondary | ICD-10-CM | POA: Diagnosis not present

## 2022-09-12 DIAGNOSIS — R2689 Other abnormalities of gait and mobility: Secondary | ICD-10-CM

## 2022-09-12 NOTE — Therapy (Signed)
OUTPATIENT PHYSICAL THERAPY NEURO TREATMENT   Patient Name: Jerome Cardenas MRN: [id number] DOB:08-13-1950, 72 y.o., male Today's Date: 09/12/2022   PCP: Dr. Cathi Roan in [place] PROVIDER: Ward Givens, NP  END OF SESSION:  PT End of Session - 09/12/22 1017     Visit Number 2    Number of Visits 13   Plus eval   Date for PT Re-Evaluation 10/26/22    Authorization Type Medicare    Progress Note Due on Visit 10    PT Start Time 1015    PT Stop Time 1101    PT Time Calculation (min) 46 min    Equipment Utilized During Treatment Gait belt    Activity Tolerance Patient tolerated treatment well    Behavior During Therapy WFL for tasks assessed/performed              Past Medical History:  Diagnosis Date   Cancer ([place])    prostate   CKD (chronic kidney disease) stage 2, GFR 60-89 ml/min 07/28/2022   HLD (hyperlipidemia)    Hypertension    Past Surgical History:  Procedure Laterality Date   APPENDECTOMY     CHOLECYSTECTOMY     HERNIA REPAIR     PROSTATE SURGERY     prostate cancer 2014   Patient Active Problem List   Diagnosis Date Noted   Diabetes mellitus type 2 with neurological manifestations ([place]) 07/29/2022   Hypertension associated with diabetes ([place]) 07/29/2022   Hyperlipidemia associated with type 2 diabetes mellitus ([place]) 07/29/2022   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    ONSET DATE: 08/29/2022 (referral)  REFERRING DIAG: [id number] (ICD-10-CM) - Abnormality of gait as late effect of stroke  THERAPY DIAG:  Hemiplegia and hemiparesis following cerebral infarction affecting right dominant side (HCC)  Other abnormalities of gait and mobility  Rationale for Evaluation and Treatment: Rehabilitation  SUBJECTIVE:                                                                                                                                                                                             SUBJECTIVE STATEMENT: Pt  presents to clinic holding cane on R side. Reports he is having a lot of RUE pain today. No falls since last visit.   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 No Pt reports he frequently has pain in his RUE and bottom of R foot, rated as 5/10 when it is at its worst   VITALS Vitals:  09/12/22 1021  BP: (!) 142/85      PRECAUTIONS: Fall  WEIGHT BEARING RESTRICTIONS: No  FALLS: Has patient fallen in last 6 months? No  LIVING ENVIRONMENT: Lives with: lives with their spouse Lives in: House/apartment Stairs: Yes: Internal: 7 +7 steps; on left going up Has following equipment at home: Quad cane small base  PLOF: Independent  PATIENT GOALS: "mainly getting the movement back in my hand like it was"  OBJECTIVE:   DIAGNOSTIC FINDINGS: MRI of brain on 07/28/22 IMPRESSION: Acute left thalamocapsular infarct.  CT of head/neck on 07/29/22 IMPRESSION: 1. Short segment occlusion of the left PCA P3 segment. 2. No proximal large vessel occlusion. 3. Chronic ischemic microangiopathy.  COGNITION: Overall cognitive status: Within functional limits for tasks assessed   SENSATION: Pt reports constant numbness/tingling in R hemibody   COORDINATION: Heel to shin test: WFL on LLE, dysmetric on RLE   EDEMA: Pt reports new edema in distal RLE and RUE   POSTURE: rounded shoulders, forward head, posterior pelvic tilt, and weight shift left  LOWER EXTREMITY MMT:  Tested in Seated position   MMT Right Eval Left Eval  Hip flexion 3+ 4  Hip extension    Hip abduction 4 4  Hip adduction 3+ 4-  Hip internal rotation    Hip external rotation    Knee flexion 2 4  Knee extension 3+ 4  Ankle dorsiflexion 4+ 4+  Ankle plantarflexion    Ankle inversion    Ankle eversion    (Blank rows = not  tested)   TODAY'S TREATMENT:      Ther Act   [facility] PT Assessment - 09/12/22 1025       Transfers   Five time sit to stand comments  16.62s w/hands braced on knees      Ambulation/Gait   Gait velocity 32.8' over 25.81s = 1.27 ft/s   No AD     Balance   Balance Assessed Yes      Standardized Balance Assessment   Standardized Balance Assessment Berg Balance Test      Berg Balance Test   Sit to Stand Able to stand without using hands and stabilize independently    Standing Unsupported Able to stand safely 2 minutes    Sitting with Back Unsupported but Feet Supported on Floor or Stool Able to sit safely and securely 2 minutes    Stand to Sit Sits safely with minimal use of hands    Transfers Able to transfer safely, minor use of hands    Standing Unsupported with Eyes Closed Able to stand 10 seconds with supervision   Minor A/P sway and lean to L   Standing Unsupported with Feet Together Able to place feet together independently and stand for 1 minute with supervision   Sway to L   From Standing, Reach Forward with Outstretched Arm Can reach confidently >25 cm (10")    From Standing Position, Pick up Object from Floor Able to pick up shoe, needs supervision    From Standing Position, Turn to Look Behind Over each Shoulder Looks behind one side only/other side shows less weight shift   decreased to R   Turn 360 Degrees Able to turn 360 degrees safely in 4 seconds or less   3.97s to L, 3.34s to R   Standing Unsupported, Alternately Place Feet on Step/Stool Able to complete >2 steps/needs minimal assist   unstable when stepping w/LLE   Standing Unsupported, One Foot in Front Able to plae foot ahead of  the other independently and hold 30 seconds    Standing on One Leg Able to lift leg independently and hold 5-10 seconds   6.5s on RLE, 9.5s on LLE   Total Score 47    Berg comment: moderate fall risk            STAIRS:  Level of Assistance: SBA Stair Negotiation Technique:  Alternating Pattern  with Single Rail on Right  Number of Stairs: 4   Height of Stairs: 6"  Comments: Min cues to place foot all the way on step while ascending as pt only placing forefoot on step and almost lost balance posteriorly. No issues w/descent   Gait Training  Gait pattern: step through pattern, decreased arm swing- Right, decreased arm swing- Left, decreased step length- Right, decreased stride length, decreased hip/knee flexion- Right, decreased ankle dorsiflexion- Right, and poor foot clearance- Right Distance walked: 115'  Assistive device utilized: Single point cane Level of assistance: SBA Comments: Trialed use of SPC, as wife has ordered one for pt. Instructed pt to hold cane on L side and importance of reciprocal sequence, but pt unable to maintain and quickly regressed to ipsilateral step and cane placement. Discussed potential to trial rollator in future sessions for improved stability w/gait and decreased pressure on RUE, as pt currently using SBQC and holding on R side. Pt verbalized understanding.    PATIENT EDUCATION: Education details: outcome measure assessment, plan for next session  Person educated: Patient and Spouse Education method: Explanation and Demonstration Education comprehension: verbalized understanding  HOME EXERCISE PROGRAM: To be established   GOALS: Goals reviewed with patient? Yes  SHORT TERM GOALS: Target date: 09/28/2022   Pt will be independent with initial HEP for improved strength, balance, transfers and gait.  Baseline: Goal status: INITIAL  2.  Pt will improve gait velocity to at least 1.5 ft/s w/LRAD for improved gait efficiency and performance at limited community ambulator level   Baseline: 1.27 ft/s without AD Goal status: REVISED  3.  Berg to be assessed and LTG written  Baseline: 49/56 on 09/12/22 Goal status: MET  4.  5x STS to be assessed and LTG written  Baseline: 16.62s w/hands braced on knees  Goal status:  MET  5.  Stairs to be assessed and LTG updated  Baseline:  Goal status: MET   LONG TERM GOALS: Target date: 10/19/2022   Pt will be independent with final HEP for improved strength, balance, transfers and gait.  Baseline:  Goal status: INITIAL  2.  Pt will improve 5 x STS to less than or equal to 13 seconds without UE support to demonstrate improved functional strength and transfer efficiency.   Baseline: 16.62s w/hands braced on knees  Goal status: REVISED  3.  Pt will improve gait velocity to at least 1.9 ft/s w/LRAD for improved gait efficiency and reduced recurrent fall risk   Baseline: 1.27 ft/s without AD Goal status: REVISED  4.  Pt will improve Berg score to 52/56 for decreased fall risk  Baseline: 49/56 Goal status: REVISED   ASSESSMENT:  CLINICAL IMPRESSION: Emphasis of skilled PT session on balance and gait assessment as well as gait training w/SPC. Pt scored a 49/56 on Berg, indicative of moderate fall risk. Pt has greatest difficulty w/narrow BOS, EC and LE coordination tasks. Pt's current gait speed is 1.27 ft/s without AD, placing him at a recurrent fall risk and household ambulator. Pt demonstrates significant difficulty sequencing SBQC and is holding cane on affected side, likely contributing to  RUE pain. Practiced holding cane on L side and performing reciprocal sequence, but pt unable to maintain this without cues. Will plan to trial rollator next session. Continue POC.    OBJECTIVE IMPAIRMENTS: Abnormal gait, decreased activity tolerance, decreased balance, decreased coordination, decreased endurance, difficulty walking, decreased strength, impaired sensation, and pain  ACTIVITY LIMITATIONS: carrying, lifting, bending, squatting, stairs, transfers, bed mobility, self feeding, hygiene/grooming, locomotion level, and caring for others  PARTICIPATION LIMITATIONS: meal prep, cleaning, medication management, interpersonal relationship, community activity, and  yard work  PERSONAL FACTORS: Age, Fitness, Past/current experiences, and 1-2 comorbidities: HTN and CVA  are also affecting patient's functional outcome.   REHAB POTENTIAL: Good  CLINICAL DECISION MAKING: Stable/uncomplicated  EVALUATION COMPLEXITY: Low  PLAN:  PT FREQUENCY: 1-2x/week  PT DURATION: 6 weeks  PLANNED INTERVENTIONS: Therapeutic exercises, Therapeutic activity, Neuromuscular re-education, Balance training, Gait training, Patient/Family education, Self Care, Joint mobilization, Stair training, DME instructions, Dry Needling, Manual therapy, and Re-evaluation  PLAN FOR NEXT SESSION: Trial rollator. HEP- EC w/head turns, toe taps, tandem gait, lateral step w/reach    Cruzita Lederer Maxwell Martorano, PT, DPT 09/12/2022, 11:05 AM

## 2022-09-14 ENCOUNTER — Ambulatory Visit: Payer: Medicare Other | Admitting: Physical Therapy

## 2022-09-14 VITALS — BP 144/74 | HR 73

## 2022-09-14 DIAGNOSIS — R2689 Other abnormalities of gait and mobility: Secondary | ICD-10-CM

## 2022-09-14 DIAGNOSIS — I69351 Hemiplegia and hemiparesis following cerebral infarction affecting right dominant side: Secondary | ICD-10-CM

## 2022-09-14 DIAGNOSIS — M6281 Muscle weakness (generalized): Secondary | ICD-10-CM

## 2022-09-14 NOTE — Therapy (Signed)
OUTPATIENT PHYSICAL THERAPY NEURO TREATMENT   Patient Name: Jerome Cardenas MRN: [id number] DOB:Jun 08, 1951, 72 y.o., male Today's Date: 09/14/2022   PCP: Dr. Cathi Roan in [place] PROVIDER: Ward Givens, NP  END OF SESSION:  PT End of Session - 09/14/22 1303     Visit Number 3    Number of Visits 13   Plus eval   Date for PT Re-Evaluation 10/26/22    Authorization Type Medicare    Progress Note Due on Visit 10    PT Start Time 1300    PT Stop Time 1344    PT Time Calculation (min) 44 min    Equipment Utilized During Treatment Gait belt    Activity Tolerance Patient tolerated treatment well    Behavior During Therapy WFL for tasks assessed/performed               Past Medical History:  Diagnosis Date   Cancer ([place])    prostate   CKD (chronic kidney disease) stage 2, GFR 60-89 ml/min 07/28/2022   HLD (hyperlipidemia)    Hypertension    Past Surgical History:  Procedure Laterality Date   APPENDECTOMY     CHOLECYSTECTOMY     HERNIA REPAIR     PROSTATE SURGERY     prostate cancer 2014   Patient Active Problem List   Diagnosis Date Noted   Diabetes mellitus type 2 with neurological manifestations ([place]) 07/29/2022   Hypertension associated with diabetes ([place]) 07/29/2022   Hyperlipidemia associated with type 2 diabetes mellitus ([place]) 07/29/2022   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    ONSET DATE: 08/29/2022 (referral)  REFERRING DIAG: [id number] (ICD-10-CM) - Abnormality of gait as late effect of stroke  THERAPY DIAG:  Hemiplegia and hemiparesis following cerebral infarction affecting right dominant side (HCC)  Muscle weakness (generalized)  Other abnormalities of gait and mobility  Rationale for Evaluation and Treatment: Rehabilitation  SUBJECTIVE:                                                                                                                                                                                              SUBJECTIVE STATEMENT: Pt presents to clinic holding cane on L side. Denies any acute changes since last visit.   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 No Pt reports he frequently has pain in his RUE and bottom of R foot, rated as 5/10 when it is at its worst   VITALS Vitals:   09/14/22 1306  BP: (!) 144/74  Pulse: 73       PRECAUTIONS: Fall  WEIGHT BEARING RESTRICTIONS: No  FALLS: Has patient fallen in last 6 months? No  LIVING ENVIRONMENT: Lives with: lives with their spouse Lives in: House/apartment Stairs: Yes: Internal: 7 +7 steps; on left going up Has following equipment at home: Quad cane small base  PLOF: Independent  PATIENT GOALS: "mainly getting the movement back in my hand like it was"  OBJECTIVE:   DIAGNOSTIC FINDINGS: MRI of brain on 07/28/22 IMPRESSION: Acute left thalamocapsular infarct.  CT of head/neck on 07/29/22 IMPRESSION: 1. Short segment occlusion of the left PCA P3 segment. 2. No proximal large vessel occlusion. 3. Chronic ischemic microangiopathy.  COGNITION: Overall cognitive status: Within functional limits for tasks assessed   SENSATION: Pt reports constant numbness/tingling in R hemibody   COORDINATION: Heel to shin test: WFL on LLE, dysmetric on RLE   EDEMA: Pt reports new edema in distal RLE and RUE   POSTURE: rounded shoulders, forward head, posterior pelvic tilt, and weight shift left  LOWER EXTREMITY MMT:  Tested in Seated position   MMT Right Eval Left Eval  Hip flexion 3+ 4  Hip extension    Hip abduction 4 4  Hip adduction 3+ 4-  Hip internal rotation    Hip external rotation    Knee flexion 2 4  Knee extension 3+ 4  Ankle dorsiflexion 4+ 4+  Ankle plantarflexion    Ankle inversion    Ankle eversion    (Blank rows = not  tested)   TODAY'S TREATMENT:      Gait Training  Gait pattern: step through pattern, decreased arm swing- Right, decreased arm swing- Left, decreased step length- Right, decreased stride length, decreased hip/knee flexion- Right, decreased ankle dorsiflexion- Right, and poor foot clearance- Right Distance walked: 230' and >1000' outdoors  Assistive device utilized: Environmental consultant - 4 wheeled Level of assistance: SBA Comments: Trialed use of rollator on level and unlevel surfaces to compare to use of SBQC. Noted increased gait speed, step length, improved posture and step clearance w/rollator compared to [facility]. Measured gait speed w/rollator (see below) and pt is a limited community Ambulator w/rollator compared to household Ambulator w/SBQC. Provided pt w/information on where to purchased rollator. Pt verbalized understanding.    [facility] PT Assessment - 09/14/22 1315       Ambulation/Gait   Gait velocity 32.8' over 17.32s = 1.89 ft/s with rollator            Ther Ex  Established initial HEP (see bolded below) for improved vestibular input and stability w/narrow BOS. Pt w/significant postural sway w/both exercises but performed safely in corner w/chair in front. Informed pt to set up exercises like this at home for safety. Pt verbalized understanding     PATIENT EDUCATION: Education details: Where to purchase rollator, initial HEP Person educated: Patient Education method: Explanation, Demonstration, and Handouts Education comprehension: verbalized understanding  HOME EXERCISE PROGRAM: Access Code: [email or web address] URL: [email or web address] Date: 09/14/2022 Prepared by: Mickie Bail Demiana Crumbley  Exercises - Feet together with eyes closed and head turns   - 1 x daily - 7 x weekly - 3-4 reps - 20-30 second hold - Tandem Stance in Corner  - 1 x daily - 7 x weekly - 3-4 reps - 15-30 second  hold  GOALS: Goals reviewed with patient? Yes  SHORT TERM GOALS: Target date: 09/28/2022   Pt will  be independent with initial HEP for improved strength, balance, transfers and gait.  Baseline: Goal  status: INITIAL  2.  Pt will improve gait velocity to at least 1.5 ft/s w/LRAD for improved gait efficiency and performance at limited community ambulator level   Baseline: 1.27 ft/s without AD Goal status: REVISED  3.  Berg to be assessed and LTG written  Baseline: 49/56 on 09/12/22 Goal status: MET  4.  5x STS to be assessed and LTG written  Baseline: 16.62s w/hands braced on knees  Goal status: MET  5.  Stairs to be assessed and LTG updated  Baseline:  Goal status: MET   LONG TERM GOALS: Target date: 10/19/2022   Pt will be independent with final HEP for improved strength, balance, transfers and gait.  Baseline:  Goal status: INITIAL  2.  Pt will improve 5 x STS to less than or equal to 13 seconds without UE support to demonstrate improved functional strength and transfer efficiency.   Baseline: 16.62s w/hands braced on knees  Goal status: REVISED  3.  Pt will improve gait velocity to at least 1.9 ft/s w/LRAD for improved gait efficiency and reduced recurrent fall risk   Baseline: 1.27 ft/s without AD Goal status: REVISED  4.  Pt will improve Berg score to 52/56 for decreased fall risk  Baseline: 49/56 Goal status: REVISED   ASSESSMENT:  CLINICAL IMPRESSION: Emphasis of skilled PT session on gait training with rollator and establishing initial HEP to address balance deficits. Pt demonstrates ability to be a limited community ambulator with use of rollator vs household ambulator w/use of [facility]. At this time, recommend pt obtain rollator for improved safety and efficiency of gait as well as reduced fall risk. Pt verbalized understanding. Established initial HEP to address balance deficits, mostly vestibular and narrow BOS, which pt had difficulty with. Continue POC.    OBJECTIVE IMPAIRMENTS: Abnormal gait, decreased activity tolerance, decreased balance, decreased  coordination, decreased endurance, difficulty walking, decreased strength, impaired sensation, and pain  ACTIVITY LIMITATIONS: carrying, lifting, bending, squatting, stairs, transfers, bed mobility, self feeding, hygiene/grooming, locomotion level, and caring for others  PARTICIPATION LIMITATIONS: meal prep, cleaning, medication management, interpersonal relationship, community activity, and yard work  PERSONAL FACTORS: Age, Fitness, Past/current experiences, and 1-2 comorbidities: HTN and CVA  are also affecting patient's functional outcome.   REHAB POTENTIAL: Good  CLINICAL DECISION MAKING: Stable/uncomplicated  EVALUATION COMPLEXITY: Low  PLAN:  PT FREQUENCY: 1-2x/week  PT DURATION: 6 weeks  PLANNED INTERVENTIONS: Therapeutic exercises, Therapeutic activity, Neuromuscular re-education, Balance training, Gait training, Patient/Family education, Self Care, Joint mobilization, Stair training, DME instructions, Dry Needling, Manual therapy, and Re-evaluation  PLAN FOR NEXT SESSION: Check BP. Did pt get rollator? HEP- toe taps, tandem gait, lateral step w/reach. Rockerboard - lateral weight shifting, cone taps, cone stack and reach. Scifit    Cruzita Lederer Kristeena Meineke, PT, DPT 09/14/2022, 1:48 PM

## 2022-09-17 ENCOUNTER — Ambulatory Visit: Payer: Medicare Other | Admitting: Physical Therapy

## 2022-09-17 VITALS — BP 169/80 | HR 69

## 2022-09-17 DIAGNOSIS — R2689 Other abnormalities of gait and mobility: Secondary | ICD-10-CM

## 2022-09-17 DIAGNOSIS — M6281 Muscle weakness (generalized): Secondary | ICD-10-CM

## 2022-09-17 DIAGNOSIS — I69351 Hemiplegia and hemiparesis following cerebral infarction affecting right dominant side: Secondary | ICD-10-CM

## 2022-09-17 NOTE — Therapy (Signed)
OUTPATIENT PHYSICAL THERAPY NEURO TREATMENT   Patient Name: Jerome Cardenas MRN: [id number] DOB:12/25/1950, 72 y.o., male Today's Date: 09/17/2022   PCP: Dr. Cathi Roan in [place] PROVIDER: Ward Givens, NP  END OF SESSION:  PT End of Session - 09/17/22 1100     Visit Number 4    Number of Visits 13   Plus eval   Date for PT Re-Evaluation 10/26/22    Authorization Type Medicare    Progress Note Due on Visit 10    PT Start Time 1100    PT Stop Time 1145    PT Time Calculation (min) 45 min    Equipment Utilized During Treatment Gait belt    Activity Tolerance Patient tolerated treatment well    Behavior During Therapy WFL for tasks assessed/performed                Past Medical History:  Diagnosis Date   Cancer ([place])    prostate   CKD (chronic kidney disease) stage 2, GFR 60-89 ml/min 07/28/2022   HLD (hyperlipidemia)    Hypertension    Past Surgical History:  Procedure Laterality Date   APPENDECTOMY     CHOLECYSTECTOMY     HERNIA REPAIR     PROSTATE SURGERY     prostate cancer 2014   Patient Active Problem List   Diagnosis Date Noted   Diabetes mellitus type 2 with neurological manifestations ([place]) 07/29/2022   Hypertension associated with diabetes ([place]) 07/29/2022   Hyperlipidemia associated with type 2 diabetes mellitus ([place]) 07/29/2022   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    ONSET DATE: 08/29/2022 (referral)  REFERRING DIAG: [id number] (ICD-10-CM) - Abnormality of gait as late effect of stroke  THERAPY DIAG:  Hemiplegia and hemiparesis following cerebral infarction affecting right dominant side (HCC)  Muscle weakness (generalized)  Other abnormalities of gait and mobility  Rationale for Evaluation and Treatment: Rehabilitation  SUBJECTIVE:                                                                                                                                                                                              SUBJECTIVE STATEMENT: Pt reports not sleeping well last night due to numbness/tingling/pain in R hemibody. No falls. Is receiving his rollator on Wednesday.   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 Yes: NPRS scale: 2/10 Pain location: R hemibody Pain description: Nerve pain Pt reports he frequently has pain in his RUE and bottom of R foot, rated  as 5/10 when it is at its worst   VITALS Vitals:   09/17/22 1112 09/17/22 1124  BP: (!) 164/85 (!) 169/80  Pulse: 70 69        PRECAUTIONS: Fall  WEIGHT BEARING RESTRICTIONS: No  FALLS: Has patient fallen in last 6 months? No  LIVING ENVIRONMENT: Lives with: lives with their spouse Lives in: House/apartment Stairs: Yes: Internal: 7 +7 steps; on left going up Has following equipment at home: Quad cane small base  PLOF: Independent  PATIENT GOALS: "mainly getting the movement back in my hand like it was"  OBJECTIVE:   DIAGNOSTIC FINDINGS: MRI of brain on 07/28/22 IMPRESSION: Acute left thalamocapsular infarct.  CT of head/neck on 07/29/22 IMPRESSION: 1. Short segment occlusion of the left PCA P3 segment. 2. No proximal large vessel occlusion. 3. Chronic ischemic microangiopathy.  COGNITION: Overall cognitive status: Within functional limits for tasks assessed   SENSATION: Pt reports constant numbness/tingling in R hemibody   COORDINATION: Heel to shin test: WFL on LLE, dysmetric on RLE   EDEMA: Pt reports new edema in distal RLE and RUE   POSTURE: rounded shoulders, forward head, posterior pelvic tilt, and weight shift left  LOWER EXTREMITY MMT:  Tested in Seated position   MMT Right Eval Left Eval  Hip flexion 3+ 4  Hip extension    Hip abduction 4 4  Hip adduction 3+ 4-  Hip internal rotation    Hip external rotation    Knee  flexion 2 4  Knee extension 3+ 4  Ankle dorsiflexion 4+ 4+  Ankle plantarflexion    Ankle inversion    Ankle eversion    (Blank rows = not tested)   TODAY'S TREATMENT:      Ther Act  Assessed vitals (See above) and pt's systolic BP more elevated today but narrowly within limits for therapy. Closely monitored BP throughout session    Ther Ex  SciFit multi-peaks level 3 for 8 minutes using BUE/BLEs for neural priming for reciprocal movement, dynamic cardiovascular warmup and global strength. RPE of 8/10 following activity. Assessed BP following activity (169/80 mmHg)  NMR  In // bars for improved ankle strategy, vestibular input, LE coordination and single leg stability:  On rockerboard in A/P direction:  Standing unsupported x2 minutes using mirror for visual biofeedback on body position. Noted bilateral knees flexed in stance and extension of lumbar spine to compensate. Minor A/P sway noted. CGA throughout  Standing w/ EC without UE support, 2x30s holds. Noted minor A/P sway but no major LOB. CGA throughout  Alt cone taps w/intermittent UE support, x12 reps per side. Noted increased difficulty shifting to L side > R side and decreased step clearance w/RLE. CGA throughout  Ipsilateral cone reach w/cross-body stack, x8 cones per side w/min A for posterior lean correction. Noted increased difficulty rotating to R side throughout and frequent posterior LOB w/this.  On Bosu blue side up, alt step ups w/contralateral march w/BUE support > LUE support, x12 reps per side. Noted decreased amplitude of march on R side    BP at end of session: 157/82 mmHg, HR 78 bpm     PATIENT EDUCATION: Education details: Continue HEP, bringing rollator to next session  Person educated: Patient Education method: Explanation, Demonstration, and Handouts Education comprehension: verbalized understanding  HOME EXERCISE PROGRAM: Access Code: [email or web address] URL: [email or web address] Date:  09/14/2022 Prepared by: Mickie Bail Akhil Piscopo  Exercises - Feet together with eyes closed and head turns   - 1 x daily - 7 x weekly -  3-4 reps - 20-30 second hold - Tandem Stance in Corner  - 1 x daily - 7 x weekly - 3-4 reps - 15-30 second  hold  GOALS: Goals reviewed with patient? Yes  SHORT TERM GOALS: Target date: 09/28/2022   Pt will be independent with initial HEP for improved strength, balance, transfers and gait.  Baseline: Goal status: INITIAL  2.  Pt will improve gait velocity to at least 1.5 ft/s w/LRAD for improved gait efficiency and performance at limited community ambulator level   Baseline: 1.27 ft/s without AD Goal status: REVISED  3.  Berg to be assessed and LTG written  Baseline: 49/56 on 09/12/22 Goal status: MET  4.  5x STS to be assessed and LTG written  Baseline: 16.62s w/hands braced on knees  Goal status: MET  5.  Stairs to be assessed and LTG updated  Baseline:  Goal status: MET   LONG TERM GOALS: Target date: 10/19/2022   Pt will be independent with final HEP for improved strength, balance, transfers and gait.  Baseline:  Goal status: INITIAL  2.  Pt will improve 5 x STS to less than or equal to 13 seconds without UE support to demonstrate improved functional strength and transfer efficiency.   Baseline: 16.62s w/hands braced on knees  Goal status: REVISED  3.  Pt will improve gait velocity to at least 1.9 ft/s w/LRAD for improved gait efficiency and reduced recurrent fall risk   Baseline: 1.27 ft/s without AD Goal status: REVISED  4.  Pt will improve Berg score to 52/56 for decreased fall risk  Baseline: 49/56 Goal status: REVISED   ASSESSMENT:  CLINICAL IMPRESSION: Emphasis of skilled PT session on endurance, single leg stability and LE coordination. Pt's BP more elevated today but did improve by end of session. Pt continues to report pain in bicep of RUE that keeps him up at night. Encouraged pt to speak to PCP regarding medication to  assist with sleep. Pt demonstrates reliance on hip strategy for all balance, so will continue to work on facilitating ankle strategy for improved safety w/gait and postural control. Continue POC.    OBJECTIVE IMPAIRMENTS: Abnormal gait, decreased activity tolerance, decreased balance, decreased coordination, decreased endurance, difficulty walking, decreased strength, impaired sensation, and pain  ACTIVITY LIMITATIONS: carrying, lifting, bending, squatting, stairs, transfers, bed mobility, self feeding, hygiene/grooming, locomotion level, and caring for others  PARTICIPATION LIMITATIONS: meal prep, cleaning, medication management, interpersonal relationship, community activity, and yard work  PERSONAL FACTORS: Age, Fitness, Past/current experiences, and 1-2 comorbidities: HTN and CVA  are also affecting patient's functional outcome.   REHAB POTENTIAL: Good  CLINICAL DECISION MAKING: Stable/uncomplicated  EVALUATION COMPLEXITY: Low  PLAN:  PT FREQUENCY: 1-2x/week  PT DURATION: 6 weeks  PLANNED INTERVENTIONS: Therapeutic exercises, Therapeutic activity, Neuromuscular re-education, Balance training, Gait training, Patient/Family education, Self Care, Joint mobilization, Stair training, DME instructions, Dry Needling, Manual therapy, and Re-evaluation  PLAN FOR NEXT SESSION: Check BP. Did pt get rollator? HEP- toe taps, tandem gait, lateral step w/reach. Blaze pods, FGA?    Cruzita Lederer Sanders Manninen, PT, DPT 09/17/2022, 11:55 AM

## 2022-09-21 ENCOUNTER — Ambulatory Visit: Payer: Medicare Other | Admitting: Physical Therapy

## 2022-09-21 VITALS — BP 166/84 | HR 74

## 2022-09-21 DIAGNOSIS — I69351 Hemiplegia and hemiparesis following cerebral infarction affecting right dominant side: Secondary | ICD-10-CM | POA: Diagnosis not present

## 2022-09-21 DIAGNOSIS — R2689 Other abnormalities of gait and mobility: Secondary | ICD-10-CM

## 2022-09-21 DIAGNOSIS — M6281 Muscle weakness (generalized): Secondary | ICD-10-CM

## 2022-09-21 NOTE — Therapy (Signed)
OUTPATIENT PHYSICAL THERAPY NEURO TREATMENT   Patient Name: Jerome Cardenas MRN: [id number] DOB:August 17, 1950, 72 y.o., male Today's Date: 09/21/2022   PCP: Dr. Cathi Roan in [place] PROVIDER: Ward Givens, NP  END OF SESSION:  PT End of Session - 09/21/22 0805     Visit Number 5    Number of Visits 13   Plus eval   Date for PT Re-Evaluation 10/26/22    Authorization Type Medicare    Progress Note Due on Visit 10    PT Start Time 0803    PT Stop Time 0850    PT Time Calculation (min) 47 min    Equipment Utilized During Treatment Gait belt    Activity Tolerance Patient tolerated treatment well    Behavior During Therapy [facility] for tasks assessed/performed                 Past Medical History:  Diagnosis Date   Cancer ([place])    prostate   CKD (chronic kidney disease) stage 2, GFR 60-89 ml/min 07/28/2022   HLD (hyperlipidemia)    Hypertension    Past Surgical History:  Procedure Laterality Date   APPENDECTOMY     CHOLECYSTECTOMY     HERNIA REPAIR     PROSTATE SURGERY     prostate cancer 2014   Patient Active Problem List   Diagnosis Date Noted   Diabetes mellitus type 2 with neurological manifestations ([place]) 07/29/2022   Hypertension associated with diabetes ([place]) 07/29/2022   Hyperlipidemia associated with type 2 diabetes mellitus ([place]) 07/29/2022   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    ONSET DATE: 08/29/2022 (referral)  REFERRING DIAG: [id number] (ICD-10-CM) - Abnormality of gait as late effect of stroke  THERAPY DIAG:  Muscle weakness (generalized)  Other abnormalities of gait and mobility  Hemiplegia and hemiparesis following cerebral infarction affecting right dominant side (HCC)  Rationale for Evaluation and Treatment: Rehabilitation  SUBJECTIVE:                                                                                                                                                                                              SUBJECTIVE STATEMENT: Pt ambulated into clinic w/new rollator. Reports continued pain in RUE. HEP is "rough. This right side does not cooperate". No falls.   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 Yes: NPRS scale: 2/10 Pain location: R hemibody Pain description: Nerve pain Pt reports he frequently has pain in his RUE and bottom of R  foot, rated as 5/10 when it is at its worst   VITALS Vitals:   09/21/22 0808  BP: (!) 166/84  Pulse: 74      PRECAUTIONS: Fall  WEIGHT BEARING RESTRICTIONS: No  FALLS: Has patient fallen in last 6 months? No  LIVING ENVIRONMENT: Lives with: lives with their spouse Lives in: House/apartment Stairs: Yes: Internal: 7 +7 steps; on left going up Has following equipment at home: Quad cane small base  PLOF: Independent  PATIENT GOALS: "mainly getting the movement back in my hand like it was"  OBJECTIVE:   DIAGNOSTIC FINDINGS: MRI of brain on 07/28/22 IMPRESSION: Acute left thalamocapsular infarct.  CT of head/neck on 07/29/22 IMPRESSION: 1. Short segment occlusion of the left PCA P3 segment. 2. No proximal large vessel occlusion. 3. Chronic ischemic microangiopathy.  COGNITION: Overall cognitive status: Within functional limits for tasks assessed   SENSATION: Pt reports constant numbness/tingling in R hemibody   COORDINATION: Heel to shin test: WFL on LLE, dysmetric on RLE   EDEMA: Pt reports new edema in distal RLE and RUE   POSTURE: rounded shoulders, forward head, posterior pelvic tilt, and weight shift left  LOWER EXTREMITY MMT:  Tested in Seated position   MMT Right Eval Left Eval  Hip flexion 3+ 4  Hip extension    Hip abduction 4 4  Hip adduction 3+ 4-  Hip internal rotation    Hip external rotation    Knee flexion 2 4  Knee extension 3+ 4   Ankle dorsiflexion 4+ 4+  Ankle plantarflexion    Ankle inversion    Ankle eversion    (Blank rows = not tested)   TODAY'S TREATMENT:      NMR  In // bars for improved midline orientation, reaching out of BOS, LE coordination and single leg stability:  On rockerboard in L/R direction: Standing unsupported using mirror for visual biofeedback on body position x3 minutes. Pt demonstrates lateral lean preference to R side, requiring min multimodal cues to shift weight to L side. Noted delayed righting reactions throughout, requiring CGA for safety.  With 2nd therapist, ball tosses x5 minutes w/min-mod A for steadying assist. Pt lost balance in both directions, but R>L. Noted increased reliance on truncal lean to correct balance and intermittent UE support on rails to stabilize.  Using 3-6 Blaze pods on 90sec intervals and random reach setting. CGA throughout:  Round 1- w/6 pods set up in straight line to work on side stepping and lateral weight shifting. 32 hits w/no UE support Round 2- w/6 pods arranged in a circle to work on turning, 30 hits w/CGA and no UE support  Round 3- w/3 pods and pt standing on airex, 50 hits w/min A and intermittent UE support for stability. Increased difficulty tapping w/LLE >RLE   Ther Ex  SciFit multi-peaks level 7.5 for 8 minutes using BUE/BLEs for neural priming for reciprocal movement, dynamic cardiovascular warmup and global strength. RPE of 8/10 following activity.     PATIENT EDUCATION: Education details: Continue HEP Person educated: Patient Education method: Explanation, Media planner, and Handouts Education comprehension: verbalized understanding  HOME EXERCISE PROGRAM: Access Code: [email or web address] URL: [email or web address] Date: 09/14/2022 Prepared by: Mickie Bail Giankarlo Leamer  Exercises - Feet together with eyes closed and head turns   - 1 x daily - 7 x weekly - 3-4 reps - 20-30 second hold - Tandem Stance in Corner  - 1 x daily - 7 x weekly  - 3-4 reps - 15-30 second  hold  GOALS: Goals reviewed  with patient? Yes  SHORT TERM GOALS: Target date: 09/28/2022   Pt will be independent with initial HEP for improved strength, balance, transfers and gait.  Baseline: Goal status: INITIAL  2.  Pt will improve gait velocity to at least 1.5 ft/s w/LRAD for improved gait efficiency and performance at limited community ambulator level   Baseline: 1.27 ft/s without AD Goal status: REVISED  3.  Berg to be assessed and LTG written  Baseline: 49/56 on 09/12/22 Goal status: MET  4.  5x STS to be assessed and LTG written  Baseline: 16.62s w/hands braced on knees  Goal status: MET  5.  Stairs to be assessed and LTG updated  Baseline:  Goal status: MET   LONG TERM GOALS: Target date: 10/19/2022   Pt will be independent with final HEP for improved strength, balance, transfers and gait.  Baseline:  Goal status: INITIAL  2.  Pt will improve 5 x STS to less than or equal to 13 seconds without UE support to demonstrate improved functional strength and transfer efficiency.   Baseline: 16.62s w/hands braced on knees  Goal status: REVISED  3.  Pt will improve gait velocity to at least 1.9 ft/s w/LRAD for improved gait efficiency and reduced recurrent fall risk   Baseline: 1.27 ft/s without AD Goal status: REVISED  4.  Pt will improve Berg score to 52/56 for decreased fall risk  Baseline: 49/56 Goal status: REVISED   ASSESSMENT:  CLINICAL IMPRESSION: Emphasis of skilled PT session on lateral weight shifting, midline orientation and LE coordination. Pt demonstrates lateral lean preference to R side, requiring min-mod A and cues to correct. Pt performed blaze pods well but does require intermittent UE support to stabilize due to lateral instability. Pt continues to be limited by RUE pain, especially in his R bicep, that is not relieved with exercise. Continue POC.    OBJECTIVE IMPAIRMENTS: Abnormal gait, decreased activity  tolerance, decreased balance, decreased coordination, decreased endurance, difficulty walking, decreased strength, impaired sensation, and pain  ACTIVITY LIMITATIONS: carrying, lifting, bending, squatting, stairs, transfers, bed mobility, self feeding, hygiene/grooming, locomotion level, and caring for others  PARTICIPATION LIMITATIONS: meal prep, cleaning, medication management, interpersonal relationship, community activity, and yard work  PERSONAL FACTORS: Age, Fitness, Past/current experiences, and 1-2 comorbidities: HTN and CVA  are also affecting patient's functional outcome.   REHAB POTENTIAL: Good  CLINICAL DECISION MAKING: Stable/uncomplicated  EVALUATION COMPLEXITY: Low  PLAN:  PT FREQUENCY: 1-2x/week  PT DURATION: 6 weeks  PLANNED INTERVENTIONS: Therapeutic exercises, Therapeutic activity, Neuromuscular re-education, Balance training, Gait training, Patient/Family education, Self Care, Joint mobilization, Stair training, DME instructions, Dry Needling, Manual therapy, and Re-evaluation  PLAN FOR NEXT SESSION: Check BP. HEP- toe taps, tandem gait, lateral step w/reach. Blaze pods, FGA? Lay=teral weight shifting, midline orientation    Drucella Karbowski E Rithvik Orcutt, PT, DPT 09/21/2022, 8:51 AM

## 2022-09-24 ENCOUNTER — Ambulatory Visit: Payer: Medicare Other | Admitting: Physical Therapy

## 2022-09-24 VITALS — BP 152/75 | HR 67

## 2022-09-24 DIAGNOSIS — M6281 Muscle weakness (generalized): Secondary | ICD-10-CM

## 2022-09-24 DIAGNOSIS — I69351 Hemiplegia and hemiparesis following cerebral infarction affecting right dominant side: Secondary | ICD-10-CM | POA: Diagnosis not present

## 2022-09-24 DIAGNOSIS — R2689 Other abnormalities of gait and mobility: Secondary | ICD-10-CM

## 2022-09-24 NOTE — Therapy (Signed)
OUTPATIENT PHYSICAL THERAPY NEURO TREATMENT   Patient Name: Jerome Cardenas MRN: [id number] DOB:05/08/1951, 72 y.o., male Today's Date: 09/24/2022   PCP: Dr. Cathi Roan in [place] PROVIDER: Ward Givens, NP  END OF SESSION:  PT End of Session - 09/24/22 1105     Visit Number 6    Number of Visits 13   Plus eval   Date for PT Re-Evaluation 10/26/22    Authorization Type Medicare    Progress Note Due on Visit 10    PT Start Time 1103    PT Stop Time 1145    PT Time Calculation (min) 42 min    Equipment Utilized During Treatment Gait belt    Activity Tolerance Patient tolerated treatment well    Behavior During Therapy WFL for tasks assessed/performed                  Past Medical History:  Diagnosis Date   Cancer ([place])    prostate   CKD (chronic kidney disease) stage 2, GFR 60-89 ml/min 07/28/2022   HLD (hyperlipidemia)    Hypertension    Past Surgical History:  Procedure Laterality Date   APPENDECTOMY     CHOLECYSTECTOMY     HERNIA REPAIR     PROSTATE SURGERY     prostate cancer 2014   Patient Active Problem List   Diagnosis Date Noted   Diabetes mellitus type 2 with neurological manifestations ([place]) 07/29/2022   Hypertension associated with diabetes ([place]) 07/29/2022   Hyperlipidemia associated with type 2 diabetes mellitus ([place]) 07/29/2022   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    ONSET DATE: 08/29/2022 (referral)  REFERRING DIAG: [id number] (ICD-10-CM) - Abnormality of gait as late effect of stroke  THERAPY DIAG:  Muscle weakness (generalized)  Other abnormalities of gait and mobility  Hemiplegia and hemiparesis following cerebral infarction affecting right dominant side (HCC)  Rationale for Evaluation and Treatment: Rehabilitation  SUBJECTIVE:                                                                                                                                                                                              SUBJECTIVE STATEMENT: Pt reports having a "rough night". Still not sleeping well due to RUE pain. No falls but a few near misses, mostly in AM.   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 Yes: NPRS scale: 1/10 Pain location: R hemibody Pain description: Nerve pain Pt reports he frequently has pain in his RUE and  bottom of R foot, rated as 5/10 when it is at its worst   VITALS Vitals:   09/24/22 1111  BP: (!) 152/75  Pulse: 67       PRECAUTIONS: Fall  WEIGHT BEARING RESTRICTIONS: No  FALLS: Has patient fallen in last 6 months? No  LIVING ENVIRONMENT: Lives with: lives with their spouse Lives in: House/apartment Stairs: Yes: Internal: 7 +7 steps; on left going up Has following equipment at home: Quad cane small base  PLOF: Independent  PATIENT GOALS: "mainly getting the movement back in my hand like it was"  OBJECTIVE:   DIAGNOSTIC FINDINGS: MRI of brain on 07/28/22 IMPRESSION: Acute left thalamocapsular infarct.  CT of head/neck on 07/29/22 IMPRESSION: 1. Short segment occlusion of the left PCA P3 segment. 2. No proximal large vessel occlusion. 3. Chronic ischemic microangiopathy.  COGNITION: Overall cognitive status: Within functional limits for tasks assessed   SENSATION: Pt reports constant numbness/tingling in R hemibody   COORDINATION: Heel to shin test: WFL on LLE, dysmetric on RLE   EDEMA: Pt reports new edema in distal RLE and RUE   POSTURE: rounded shoulders, forward head, posterior pelvic tilt, and weight shift left  LOWER EXTREMITY MMT:  Tested in Seated position   MMT Right Eval Left Eval  Hip flexion 3+ 4  Hip extension    Hip abduction 4 4  Hip adduction 3+ 4-  Hip internal rotation    Hip external rotation    Knee flexion 2 4  Knee extension 3+ 4  Ankle  dorsiflexion 4+ 4+  Ankle plantarflexion    Ankle inversion    Ankle eversion    (Blank rows = not tested)   TODAY'S TREATMENT:     NMR  At counter, lat advance/retreat over 4" beam, x10 per side w/no UE support for improved lateral weight shift, increased step clearance and LE coordination . Pt w/increased difficulty performing on R side due to decreased weight shift to L, knocking over beam x1 on R side.  Progressed to adding contralateral reach w/each step for improved thoracic rotation and facilitation of weight shift, x14 per side. Pt w/continued difficulty when performing on R side and frequently knocked over beam  Walking w/bosu surge (15#) x150' around gym w/turns to L and R side for improved dynamic and reactive balance strategies. No instability noted throughout but did note decreased step length of RLE>LLE   Self-care/home management  Discussed ongoing RUE pain/spasms that are preventing pt from sleeping and encouraged him to reach out to Ward Givens, NP to start Baclofen (noted in his most recent visit with her).  Discussed modifying pt's sleep position, as he sleeps on R side w/RUE in flexion, adduction and IR, which is painful. Encouraged pt to attempt to elevate RUE on pillows or sleep with two pillows under head to alleviate pressure on arm. Pt verbalized understanding.  Pt reports that his near misses occur when he feels lightheaded (typically in early AM). Educated pt on performing seated marches/ankle pumps at edge of mat prior to standing in morning to assist w/BP and HR. Pt verbalized understanding.     PATIENT EDUCATION: Education details: Continue HEP, see self-care section  Person educated: Patient Education method: Customer service manager Education comprehension: verbalized understanding  HOME EXERCISE PROGRAM: Access Code: [email or web address] URL: [email or web address] Date: 09/14/2022 Prepared by: Mickie Bail Tomi Paddock  Exercises - Feet together with  eyes closed and head turns   - 1 x daily - 7 x weekly - 3-4 reps - 20-30 second hold -  Tandem Stance in Corner  - 1 x daily - 7 x weekly - 3-4 reps - 15-30 second  hold  GOALS: Goals reviewed with patient? Yes  SHORT TERM GOALS: Target date: 09/28/2022   Pt will be independent with initial HEP for improved strength, balance, transfers and gait.  Baseline: Goal status: INITIAL  2.  Pt will improve gait velocity to at least 1.5 ft/s w/LRAD for improved gait efficiency and performance at limited community ambulator level   Baseline: 1.27 ft/s without AD Goal status: REVISED  3.  Berg to be assessed and LTG written  Baseline: 49/56 on 09/12/22 Goal status: MET  4.  5x STS to be assessed and LTG written  Baseline: 16.62s w/hands braced on knees  Goal status: MET  5.  Stairs to be assessed and LTG updated  Baseline:  Goal status: MET   LONG TERM GOALS: Target date: 10/19/2022   Pt will be independent with final HEP for improved strength, balance, transfers and gait.  Baseline:  Goal status: INITIAL  2.  Pt will improve 5 x STS to less than or equal to 13 seconds without UE support to demonstrate improved functional strength and transfer efficiency.   Baseline: 16.62s w/hands braced on knees  Goal status: REVISED  3.  Pt will improve gait velocity to at least 1.9 ft/s w/LRAD for improved gait efficiency and reduced recurrent fall risk   Baseline: 1.27 ft/s without AD Goal status: REVISED  4.  Pt will improve Berg score to 52/56 for decreased fall risk  Baseline: 49/56 Goal status: REVISED   ASSESSMENT:  CLINICAL IMPRESSION: Emphasis of skilled PT session on increased step length/clearance, lateral weight shifting and pt education. Pt continues to report not sleeping at night due to RUE pain and spasms, so encouraged pt to reach out to Ward Givens, NP to inquire about medication management. Pt continues to demonstrate decreased weight shift to L side, resulting in  asymmetrical step length and increased fall risk. Continue POC.    OBJECTIVE IMPAIRMENTS: Abnormal gait, decreased activity tolerance, decreased balance, decreased coordination, decreased endurance, difficulty walking, decreased strength, impaired sensation, and pain  ACTIVITY LIMITATIONS: carrying, lifting, bending, squatting, stairs, transfers, bed mobility, self feeding, hygiene/grooming, locomotion level, and caring for others  PARTICIPATION LIMITATIONS: meal prep, cleaning, medication management, interpersonal relationship, community activity, and yard work  PERSONAL FACTORS: Age, Fitness, Past/current experiences, and 1-2 comorbidities: HTN and CVA  are also affecting patient's functional outcome.   REHAB POTENTIAL: Good  CLINICAL DECISION MAKING: Stable/uncomplicated  EVALUATION COMPLEXITY: Low  PLAN:  PT FREQUENCY: 1-2x/week  PT DURATION: 6 weeks  PLANNED INTERVENTIONS: Therapeutic exercises, Therapeutic activity, Neuromuscular re-education, Balance training, Gait training, Patient/Family education, Self Care, Joint mobilization, Stair training, DME instructions, Dry Needling, Manual therapy, and Re-evaluation  PLAN FOR NEXT SESSION: Check BP. Goal assessment. Did pt call [facility]? HEP- toe taps, tandem gait. Blaze pods, FGA? Lateral weight shifting, midline orientation    Nikkita Adeyemi E Sahir Tolson, PT, DPT 09/24/2022, 11:52 AM

## 2022-09-27 ENCOUNTER — Encounter: Payer: Self-pay | Admitting: Physical Therapy

## 2022-09-27 ENCOUNTER — Ambulatory Visit: Payer: Medicare Other | Admitting: Physical Therapy

## 2022-09-27 VITALS — BP 129/67 | HR 72

## 2022-09-27 DIAGNOSIS — M6281 Muscle weakness (generalized): Secondary | ICD-10-CM

## 2022-09-27 DIAGNOSIS — I69351 Hemiplegia and hemiparesis following cerebral infarction affecting right dominant side: Secondary | ICD-10-CM | POA: Diagnosis not present

## 2022-09-27 DIAGNOSIS — R2689 Other abnormalities of gait and mobility: Secondary | ICD-10-CM

## 2022-09-27 NOTE — Therapy (Signed)
OUTPATIENT PHYSICAL THERAPY NEURO TREATMENT   Patient Name: Jerome Cardenas MRN: [id number] DOB:03-14-1951, 72 y.o., male Today's Date: 09/27/2022   PCP: Dr. Cathi Roan in [place] PROVIDER: Ward Givens, NP  END OF SESSION:  PT End of Session - 09/27/22 1017     Visit Number 7    Number of Visits 13    Date for PT Re-Evaluation 10/26/22    Authorization Type Medicare    Progress Note Due on Visit 10    PT Start Time 1015    PT Stop Time 1101    PT Time Calculation (min) 46 min    Equipment Utilized During Treatment Gait belt    Activity Tolerance Patient tolerated treatment well    Behavior During Therapy WFL for tasks assessed/performed             Past Medical History:  Diagnosis Date   Cancer ([place])    prostate   CKD (chronic kidney disease) stage 2, GFR 60-89 ml/min 07/28/2022   HLD (hyperlipidemia)    Hypertension    Past Surgical History:  Procedure Laterality Date   APPENDECTOMY     CHOLECYSTECTOMY     HERNIA REPAIR     PROSTATE SURGERY     prostate cancer 2014   Patient Active Problem List   Diagnosis Date Noted   Diabetes mellitus type 2 with neurological manifestations ([place]) 07/29/2022   Hypertension associated with diabetes ([place]) 07/29/2022   Hyperlipidemia associated with type 2 diabetes mellitus ([place]) 07/29/2022   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    ONSET DATE: 08/29/2022 (referral)  REFERRING DIAG: [id number] (ICD-10-CM) - Abnormality of gait as late effect of stroke  THERAPY DIAG:  Muscle weakness (generalized)  Other abnormalities of gait and mobility  Hemiplegia and hemiparesis following cerebral infarction affecting right dominant side (HCC)  Rationale for Evaluation and Treatment: Rehabilitation  SUBJECTIVE:                                                                                                                                                                                             SUBJECTIVE  STATEMENT: Pt reports that he is doing alright. He continues to feel more unsteady on the L side. Patient has not had a chance to follow up with his doctor at this time. Patient reports that his pain has been about the same.   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 Yes: NPRS scale: 2/10 Pain location: R hemibody Pain description: Nerve pain  Pt reports he frequently has pain in his RUE  VITALS Vitals:   09/27/22 1022  BP: 129/67  Pulse: 72    PRECAUTIONS: Fall  WEIGHT BEARING RESTRICTIONS: No  FALLS: Has patient fallen in last 6 months? No  LIVING ENVIRONMENT: Lives with: lives with their spouse Lives in: House/apartment Stairs: Yes: Internal: 7 +7 steps; on left going up Has following equipment at home: Quad cane small base  PLOF: Independent  PATIENT GOALS: "mainly getting the movement back in my hand like it was"  OBJECTIVE:   DIAGNOSTIC FINDINGS: MRI of brain on 07/28/22 IMPRESSION: Acute left thalamocapsular infarct.  CT of head/neck on 07/29/22 IMPRESSION: 1. Short segment occlusion of the left PCA P3 segment. 2. No proximal large vessel occlusion. 3. Chronic ischemic microangiopathy.  COGNITION: Overall cognitive status: Within functional limits for tasks assessed   SENSATION: Pt reports constant numbness/tingling in R hemibody   COORDINATION: Heel to shin test: WFL on LLE, dysmetric on RLE   EDEMA: Pt reports new edema in distal RLE and RUE   POSTURE: rounded shoulders, forward head, posterior pelvic tilt, and weight shift left  LOWER EXTREMITY MMT:  Tested in Seated position   MMT Right Eval Left Eval  Hip flexion 3+ 4  Hip extension    Hip abduction 4 4  Hip adduction 3+ 4-  Hip internal rotation    Hip external rotation    Knee flexion 2 4  Knee extension 3+ 4  Ankle  dorsiflexion 4+ 4+  Ankle plantarflexion    Ankle inversion    Ankle eversion    (Blank rows = not tested)   TODAY'S TREATMENT:      NMR  Treadmill training increased speed to 1.6 mph and 2% incline with XL overhead harness donned and 0% bws setting with use of bilateral UE support on treadmill Performed x 2 min and monitored HR response and SPO2 (108, 97% after 2 min) Trialed an additional 2 min and HR dropped to 50bpm despite reporting increase in RPE (SPO2 97%) - ended treadmill trial and HR returned to 88 bpm (no arhythmia noted) HR remained between 78-90 bpm for remainder of session 6" Hurdle step overs obstacle course and step up onto airex pad + 4 cone taps x 3 times (CGA) 6" Hurdle step/return laterally, fwd calling out which limb to advance (CGA) Increased difficulty with weight acceptance onto RLE  TherAct: Stair ascent/descent with single UE support x CGA x 4 stairs - step through gait pattern, x 1 sign of unsteadiness and able to self correct and returned to bilateral UE support  Stair ascent/descent with bilateral UE support with SBA x 4 stairs - step through gait pattern Stair ascent/descent with no UE support with CGA x 4 stairs - step to gait pattern  PATIENT EDUCATION: Education details: Continue HEP, consider follow up with cardiology pending HR response Person educated: Patient Education method: Explanation and Demonstration Education comprehension: verbalized understanding  HOME EXERCISE PROGRAM: Access Code: [email or web address] URL: [email or web address] Date: 09/14/2022 Prepared by: Mickie Bail Plaster  Exercises - Feet together with eyes closed and head turns   - 1 x daily - 7 x weekly - 3-4 reps - 20-30 second hold - Tandem Stance in Corner  - 1 x daily - 7 x weekly - 3-4 reps - 15-30 second  hold  GOALS: Goals reviewed with patient? Yes  SHORT TERM GOALS: Target date: 09/28/2022   Pt will be independent with initial HEP for improved strength, balance,  transfers and gait.  Baseline: Goal status: INITIAL  2.  Pt will improve gait velocity to at least 1.5 ft/s w/LRAD for improved gait efficiency and performance at limited community ambulator level   Baseline: 1.27 ft/s without AD Goal status: REVISED  3.  Berg to be assessed and LTG written  Baseline: 49/56 on 09/12/22 Goal status: MET  4.  5x STS to be assessed and LTG written  Baseline: 16.62s w/hands braced on knees  Goal status: MET  5.  Stairs to be assessed and LTG updated  Baseline:  Goal status: MET   LONG TERM GOALS: Target date: 10/19/2022   Pt will be independent with final HEP for improved strength, balance, transfers and gait.  Baseline:  Goal status: INITIAL  2.  Pt will improve 5 x STS to less than or equal to 13 seconds without UE support to demonstrate improved functional strength and transfer efficiency.   Baseline: 16.62s w/hands braced on knees  Goal status: REVISED  3.  Pt will improve gait velocity to at least 1.9 ft/s w/LRAD for improved gait efficiency and reduced recurrent fall risk   Baseline: 1.27 ft/s without AD Goal status: REVISED  4.  Pt will improve Berg score to 52/56 for decreased fall risk  Baseline: 49/56 Goal status: REVISED   ASSESSMENT:  CLINICAL IMPRESSION: Attempted use of treadmill training during session to improve step length and speed. Patient had very poor HR response so ended trial after 4 min. Remainder of session spent on overground training, which patient tolerated well. Decreased weight acceptance onto RLE. Patient will benefit from skilled physical therapy services to increase tolerance to activity and progress gait/balance. Continue POC.   OBJECTIVE IMPAIRMENTS: Abnormal gait, decreased activity tolerance, decreased balance, decreased coordination, decreased endurance, difficulty walking, decreased strength, impaired sensation, and pain  ACTIVITY LIMITATIONS: carrying, lifting, bending, squatting, stairs,  transfers, bed mobility, self feeding, hygiene/grooming, locomotion level, and caring for others  PARTICIPATION LIMITATIONS: meal prep, cleaning, medication management, interpersonal relationship, community activity, and yard work  PERSONAL FACTORS: Age, Fitness, Past/current experiences, and 1-2 comorbidities: HTN and CVA  are also affecting patient's functional outcome.   REHAB POTENTIAL: Good  CLINICAL DECISION MAKING: Stable/uncomplicated  EVALUATION COMPLEXITY: Low  PLAN:  PT FREQUENCY: 1-2x/week  PT DURATION: 6 weeks  PLANNED INTERVENTIONS: Therapeutic exercises, Therapeutic activity, Neuromuscular re-education, Balance training, Gait training, Patient/Family education, Self Care, Joint mobilization, Stair training, DME instructions, Dry Needling, Manual therapy, and Re-evaluation  PLAN FOR NEXT SESSION: Check BP. Goal assessment. Did pt call [facility]NA? HEP- toe taps, tandem gait. Blaze pods, FGA? Lateral weight shifting, midline orientation, monitor HR with higher level activities with possible use of RPE scale    Esperanza Heir, PT, DPT 09/27/2022, 12:32 PM

## 2022-10-01 ENCOUNTER — Ambulatory Visit: Payer: Medicare Other | Admitting: Physical Therapy

## 2022-10-01 VITALS — BP 156/77 | HR 74

## 2022-10-01 DIAGNOSIS — I69351 Hemiplegia and hemiparesis following cerebral infarction affecting right dominant side: Secondary | ICD-10-CM | POA: Diagnosis not present

## 2022-10-01 DIAGNOSIS — R2689 Other abnormalities of gait and mobility: Secondary | ICD-10-CM

## 2022-10-01 DIAGNOSIS — M6281 Muscle weakness (generalized): Secondary | ICD-10-CM

## 2022-10-01 NOTE — Therapy (Signed)
OUTPATIENT PHYSICAL THERAPY NEURO TREATMENT   Patient Name: Jerome Cardenas MRN: [id number] DOB:10/19/1950, 72 y.o., male Today's Date: 10/01/2022   PCP: Dr. Cathi Roan in [place] PROVIDER: Ward Givens, NP  END OF SESSION:  PT End of Session - 10/01/22 1106     Visit Number 8    Number of Visits 13    Date for PT Re-Evaluation 10/26/22    Authorization Type Medicare    Progress Note Due on Visit 10    PT Start Time 1104    PT Stop Time 1148    PT Time Calculation (min) 44 min    Equipment Utilized During Treatment Gait belt    Activity Tolerance Patient tolerated treatment well    Behavior During Therapy WFL for tasks assessed/performed              Past Medical History:  Diagnosis Date   Cancer ([place])    prostate   CKD (chronic kidney disease) stage 2, GFR 60-89 ml/min 07/28/2022   HLD (hyperlipidemia)    Hypertension    Past Surgical History:  Procedure Laterality Date   APPENDECTOMY     CHOLECYSTECTOMY     HERNIA REPAIR     PROSTATE SURGERY     prostate cancer 2014   Patient Active Problem List   Diagnosis Date Noted   Diabetes mellitus type 2 with neurological manifestations ([place]) 07/29/2022   Hypertension associated with diabetes ([place]) 07/29/2022   Hyperlipidemia associated with type 2 diabetes mellitus ([place]) 07/29/2022   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    ONSET DATE: 08/29/2022 (referral)  REFERRING DIAG: [id number] (ICD-10-CM) - Abnormality of gait as late effect of stroke  THERAPY DIAG:  Muscle weakness (generalized)  Other abnormalities of gait and mobility  Hemiplegia and hemiparesis following cerebral infarction affecting right dominant side (HCC)  Rationale for Evaluation and Treatment: Rehabilitation  SUBJECTIVE:                                                                                                                                                                                             SUBJECTIVE  STATEMENT: Pt reports doing well. Still having R hemibody pain that is interrupting his sleep. Forgot to call Ward Givens. HEP is "okay, it is still a lot of off balance".   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 Yes: NPRS scale: 2/10 Pain location: R hemibody Pain description: Nerve pain Pt reports he frequently has pain in his RUE  VITALS  Vitals:   10/01/22 1112  BP: (!) 156/77  Pulse: 74     PRECAUTIONS: Fall  WEIGHT BEARING RESTRICTIONS: No  FALLS: Has patient fallen in last 6 months? No  LIVING ENVIRONMENT: Lives with: lives with their spouse Lives in: House/apartment Stairs: Yes: Internal: 7 +7 steps; on left going up Has following equipment at home: Quad cane small base  PLOF: Independent  PATIENT GOALS: "mainly getting the movement back in my hand like it was"  OBJECTIVE:   DIAGNOSTIC FINDINGS: MRI of brain on 07/28/22 IMPRESSION: Acute left thalamocapsular infarct.  CT of head/neck on 07/29/22 IMPRESSION: 1. Short segment occlusion of the left PCA P3 segment. 2. No proximal large vessel occlusion. 3. Chronic ischemic microangiopathy.  COGNITION: Overall cognitive status: Within functional limits for tasks assessed   SENSATION: Pt reports constant numbness/tingling in R hemibody   COORDINATION: Heel to shin test: WFL on LLE, dysmetric on RLE   EDEMA: Pt reports new edema in distal RLE and RUE   POSTURE: rounded shoulders, forward head, posterior pelvic tilt, and weight shift left  LOWER EXTREMITY MMT:  Tested in Seated position   MMT Right Eval Left Eval  Hip flexion 3+ 4  Hip extension    Hip abduction 4 4  Hip adduction 3+ 4-  Hip internal rotation    Hip external rotation    Knee flexion 2 4  Knee extension 3+ 4  Ankle dorsiflexion 4+ 4+  Ankle plantarflexion     Ankle inversion    Ankle eversion    (Blank rows = not tested)   TODAY'S TREATMENT:     Ther Act Assessed vitals (see above)  Encouraged pt contact Ward Givens and the importance of sleep on recovery and PNE. Pt verbalized understanding.   Ther Ex  SciFit multi-peaks level 7.5 for 8 minutes using BUE/BLEs for neural priming for reciprocal movement, dynamic cardiovascular warmup and increased amplitude of stepping. RPE of 5/10 following activity    NMR In // bars, using 4 Blaze pods on random reach setting for LE coordination, single leg stability, turns and dynamic balance:  Round 1- 13 hits while side stepping and navigating 3 4" hurdles. Increased difficulty clearing hurdle w/RLE, requiring CGA  Round 2- 20 hits while side stepping on purple beam. Significant catching of RLE on beam, requiring min-mod A for safety. Pt also leaning on posterior rail throughout for stability  Round 3 - Standing on airex with pods in corners, 22 hits w/CGA. Increased difficulty tapping w/LLE due to decreased weight shift to R  Walking ball tosses w/second therapist x115' w/CGA for improved dynamic balance strategies and dual-tasking. No instability noted throughout, but pt w/increased difficulty reaching OH to catch ball.    PATIENT EDUCATION: Education details: Continue HEP, reaching out to [employer] regarding R hemibody pain  Person educated: Patient Education method: Customer service manager Education comprehension: verbalized understanding  HOME EXERCISE PROGRAM: Access Code: [email or web address] URL: [email or web address] Date: 09/14/2022 Prepared by: Mickie Bail Osamu Olguin  Exercises - Feet together with eyes closed and head turns   - 1 x daily - 7 x weekly - 3-4 reps - 20-30 second hold - Tandem Stance in Corner  - 1 x daily - 7 x weekly - 3-4 reps - 15-30 second  hold  GOALS: Goals reviewed with patient? Yes  SHORT TERM GOALS: Target date: 09/28/2022   Pt will be independent  with initial HEP for improved strength, balance, transfers and gait.  Baseline: Goal status: MET  2.  Pt will  improve gait velocity to at least 1.5 ft/s w/LRAD for improved gait efficiency and performance at limited community ambulator level   Baseline: 1.27 ft/s without AD Goal status: REVISED  3.  Berg to be assessed and LTG written  Baseline: 49/56 on 09/12/22 Goal status: MET  4.  5x STS to be assessed and LTG written  Baseline: 16.62s w/hands braced on knees  Goal status: MET  5.  Stairs to be assessed and LTG updated  Baseline:  Goal status: MET   LONG TERM GOALS: Target date: 10/19/2022   Pt will be independent with final HEP for improved strength, balance, transfers and gait.  Baseline:  Goal status: INITIAL  2.  Pt will improve 5 x STS to less than or equal to 13 seconds without UE support to demonstrate improved functional strength and transfer efficiency.   Baseline: 16.62s w/hands braced on knees  Goal status: REVISED  3.  Pt will improve gait velocity to at least 1.9 ft/s w/LRAD for improved gait efficiency and reduced recurrent fall risk   Baseline: 1.27 ft/s without AD Goal status: REVISED  4.  Pt will improve Berg score to 52/56 for decreased fall risk  Baseline: 49/56 Goal status: REVISED   ASSESSMENT:  CLINICAL IMPRESSION: Emphasis of skilled PT session on endurance, LE coordination, lateral weight shifting and dual-tasking. Pt continues to report numbness/cramping in R hemibody that affects his sleep, so encouraged pt to reach out to neurologist regarding this. Pt most challenged by single leg stability and lateral weight shifting to R side but has improved since eval. Pt continues to benefit from skilled PT to address balance deficits and improved safety w/functional mobility. Continue POC.   OBJECTIVE IMPAIRMENTS: Abnormal gait, decreased activity tolerance, decreased balance, decreased coordination, decreased endurance, difficulty walking,  decreased strength, impaired sensation, and pain  ACTIVITY LIMITATIONS: carrying, lifting, bending, squatting, stairs, transfers, bed mobility, self feeding, hygiene/grooming, locomotion level, and caring for others  PARTICIPATION LIMITATIONS: meal prep, cleaning, medication management, interpersonal relationship, community activity, and yard work  PERSONAL FACTORS: Age, Fitness, Past/current experiences, and 1-2 comorbidities: HTN and CVA  are also affecting patient's functional outcome.   REHAB POTENTIAL: Good  CLINICAL DECISION MAKING: Stable/uncomplicated  EVALUATION COMPLEXITY: Low  PLAN:  PT FREQUENCY: 1-2x/week  PT DURATION: 6 weeks  PLANNED INTERVENTIONS: Therapeutic exercises, Therapeutic activity, Neuromuscular re-education, Balance training, Gait training, Patient/Family education, Self Care, Joint mobilization, Stair training, DME instructions, Dry Needling, Manual therapy, and Re-evaluation  PLAN FOR NEXT SESSION: Check BP. Goal assessment. Did pt call [facility]? HEP- toe taps, tandem gait. Blaze pods, FGA? Lateral weight shifting, midline orientation, monitor HR with higher level activities with possible use of RPE scale    Alger Kerstein E Cathyrn Deas, PT, DPT 10/01/2022, 11:49 AM

## 2022-10-03 ENCOUNTER — Ambulatory Visit: Payer: Medicare Other | Admitting: Physical Therapy

## 2022-10-03 ENCOUNTER — Telehealth: Payer: Self-pay | Admitting: Adult Health

## 2022-10-03 VITALS — BP 168/86 | HR 77

## 2022-10-03 DIAGNOSIS — M6281 Muscle weakness (generalized): Secondary | ICD-10-CM

## 2022-10-03 DIAGNOSIS — R2689 Other abnormalities of gait and mobility: Secondary | ICD-10-CM

## 2022-10-03 DIAGNOSIS — I69351 Hemiplegia and hemiparesis following cerebral infarction affecting right dominant side: Secondary | ICD-10-CM | POA: Diagnosis not present

## 2022-10-03 NOTE — Therapy (Signed)
OUTPATIENT PHYSICAL THERAPY NEURO TREATMENT   Patient Name: Jerome Cardenas MRN: [id number] DOB:[birth year], 72 y.o., male Today's Date: 10/03/2022   PCP: Dr. Cathi Roan in [place] PROVIDER: Ward Givens, NP  END OF SESSION:  PT End of Session - 10/03/22 1018     Visit Number 9    Number of Visits 13    Date for PT Re-Evaluation 10/26/22    Authorization Type Medicare    Progress Note Due on Visit 10    PT Start Time 1018    PT Stop Time 1100    PT Time Calculation (min) 42 min    Equipment Utilized During Treatment Gait belt    Activity Tolerance Patient tolerated treatment well    Behavior During Therapy WFL for tasks assessed/performed               Past Medical History:  Diagnosis Date   Cancer ([place])    prostate   CKD (chronic kidney disease) stage 2, GFR 60-89 ml/min 07/28/2022   HLD (hyperlipidemia)    Hypertension    Past Surgical History:  Procedure Laterality Date   APPENDECTOMY     CHOLECYSTECTOMY     HERNIA REPAIR     PROSTATE SURGERY     prostate cancer 2014   Patient Active Problem List   Diagnosis Date Noted   Diabetes mellitus type 2 with neurological manifestations ([place]) 07/29/2022   Hypertension associated with diabetes ([place]) 07/29/2022   Hyperlipidemia associated with type 2 diabetes mellitus ([place]) 07/29/2022   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    ONSET DATE: 08/29/2022 (referral)  REFERRING DIAG: [id number] (ICD-10-CM) - Abnormality of gait as late effect of stroke  THERAPY DIAG:  Muscle weakness (generalized)  Other abnormalities of gait and mobility  Hemiplegia and hemiparesis following cerebral infarction affecting right dominant side (HCC)  Rationale for Evaluation and Treatment: Rehabilitation  SUBJECTIVE:                                                                                                                                                                                              SUBJECTIVE STATEMENT: Pt ambulated into clinic w/[facility] due to rain. Reports he is worried baclofen would cause incontinence, has not reached out to [facility]. No falls.   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 No Pt reports he frequently has pain in his RUE  VITALS Vitals:   10/03/22 1030 10/03/22 1040  BP: (!) 174/83 (!) 168/86  Pulse:  75 77      PRECAUTIONS: Fall  WEIGHT BEARING RESTRICTIONS: No  FALLS: Has patient fallen in last 6 months? No  LIVING ENVIRONMENT: Lives with: lives with their spouse Lives in: House/apartment Stairs: Yes: Internal: 7 +7 steps; on left going up Has following equipment at home: Quad cane small base  PLOF: Independent  PATIENT GOALS: "mainly getting the movement back in my hand like it was"  OBJECTIVE:   DIAGNOSTIC FINDINGS: MRI of brain on 07/28/22 IMPRESSION: Acute left thalamocapsular infarct.  CT of head/neck on 07/29/22 IMPRESSION: 1. Short segment occlusion of the left PCA P3 segment. 2. No proximal large vessel occlusion. 3. Chronic ischemic microangiopathy.  COGNITION: Overall cognitive status: Within functional limits for tasks assessed   SENSATION: Pt reports constant numbness/tingling in R hemibody   COORDINATION: Heel to shin test: WFL on LLE, dysmetric on RLE   EDEMA: Pt reports new edema in distal RLE and RUE   POSTURE: rounded shoulders, forward head, posterior pelvic tilt, and weight shift left  LOWER EXTREMITY MMT:  Tested in Seated position   MMT Right Eval Left Eval  Hip flexion 3+ 4  Hip extension    Hip abduction 4 4  Hip adduction 3+ 4-  Hip internal rotation    Hip external rotation    Knee flexion 2 4  Knee extension 3+ 4  Ankle dorsiflexion 4+ 4+  Ankle plantarflexion    Ankle inversion    Ankle eversion     (Blank rows = not tested)   TODAY'S TREATMENT:     Ther Act Assessed vitals (see above) and systolic BP elevated and close to limit to participate in therapy, so closely monitored throughout remainder of session  FOTO: 60  STG Assessment   [facility] PT Assessment - 10/03/22 1044       Ambulation/Gait   Gait velocity 32.8' over 12.57s = 2.6 ft/s without AD      Functional Gait  Assessment   Gait assessed  Yes    Gait Level Surface Walks 20 ft, slow speed, abnormal gait pattern, evidence for imbalance or deviates 10-15 in outside of the 12 in walkway width. Requires more than 7 sec to ambulate 20 ft.   8.78s   Change in Gait Speed Able to change speed, demonstrates mild gait deviations, deviates 6-10 in outside of the 12 in walkway width, or no gait deviations, unable to achieve a major change in velocity, or uses a change in velocity, or uses an assistive device.    Gait with Horizontal Head Turns Performs head turns smoothly with no change in gait. Deviates no more than 6 in outside 12 in walkway width    Gait with Vertical Head Turns Performs head turns with no change in gait. Deviates no more than 6 in outside 12 in walkway width.    Gait and Pivot Turn Pivot turns safely within 3 sec and stops quickly with no loss of balance.    Step Over Obstacle Is able to step over 2 stacked shoe boxes taped together (9 in total height) without changing gait speed. No evidence of imbalance.              Ther Ex  SciFit ramp up level 7  or 6 minutes using BUE/BLEs for neural priming for reciprocal movement, dynamic cardiovascular warmup and increased amplitude of stepping. RPE of 5/10 and BP 168/86 mmHg following activity    PATIENT EDUCATION: Education details: Continue HEP, goal outcomes  Person educated: Patient Education method:  Explanation and Demonstration Education comprehension: verbalized understanding  HOME EXERCISE PROGRAM: Access Code: [email or web address] URL:  [email or web address] Date: 09/14/2022 Prepared by: Mickie Bail Jarita Jerome  Exercises - Feet together with eyes closed and head turns   - 1 x daily - 7 x weekly - 3-4 reps - 20-30 second hold - Tandem Stance in Corner  - 1 x daily - 7 x weekly - 3-4 reps - 15-30 second  hold  GOALS: Goals reviewed with patient? Yes  SHORT TERM GOALS: Target date: 09/28/2022   Pt will be independent with initial HEP for improved strength, balance, transfers and gait.  Baseline: Goal status: MET  2.  Pt will improve gait velocity to at least 1.5 ft/s w/LRAD for improved gait efficiency and performance at limited community ambulator level   Baseline: 1.27 ft/s without AD; 2.6 ft/s without AD (3/27) Goal status: MET  3.  Berg to be assessed and LTG written  Baseline: 49/56 on 09/12/22 Goal status: MET  4.  5x STS to be assessed and LTG written  Baseline: 16.62s w/hands braced on knees  Goal status: MET  5.  Stairs to be assessed and LTG updated  Baseline:  Goal status: MET   LONG TERM GOALS: Target date: 10/19/2022   Pt will be independent with final HEP for improved strength, balance, transfers and gait.  Baseline:  Goal status: INITIAL  2.  Pt will improve 5 x STS to less than or equal to 13 seconds without UE support to demonstrate improved functional strength and transfer efficiency.   Baseline: 16.62s w/hands braced on knees  Goal status: REVISED  3.  Pt will improve gait velocity to at least 2.8 ft/s w/LRAD for improved gait efficiency and reduced recurrent fall risk   Baseline: 1.27 ft/s without AD; 2.6 ft/s without AD (3/27)  Goal status: REVISED  4.  Pt will improve Berg score to 52/56 for decreased fall risk  Baseline: 49/56 Goal status: REVISED  5.  Pt will improve FOTO to 61 for improved subjective functional ability   Baseline: 47; 60 (3/27)  Goal Status: INITIAL    ASSESSMENT:  CLINICAL IMPRESSION: Emphasis of skilled PT session on STG assessment,  dynamic balance assessment and endurance. Pt's BP more elevated this date, but stayed narrowly within limits for therapy. Pt has significantly improved his gait speed this date, from 1.27 ft/s to 2.6 ft/s without AD, indicative of limited community ambulation level. Began dynamic balance assessment (FGA) but will need to finish next session. Pt has also improved his FOTO to 60, 1 point below his estimated level by visit 18. Continue POC.   OBJECTIVE IMPAIRMENTS: Abnormal gait, decreased activity tolerance, decreased balance, decreased coordination, decreased endurance, difficulty walking, decreased strength, impaired sensation, and pain  ACTIVITY LIMITATIONS: carrying, lifting, bending, squatting, stairs, transfers, bed mobility, self feeding, hygiene/grooming, locomotion level, and caring for others  PARTICIPATION LIMITATIONS: meal prep, cleaning, medication management, interpersonal relationship, community activity, and yard work  PERSONAL FACTORS: Age, Fitness, Past/current experiences, and 1-2 comorbidities: HTN and CVA  are also affecting patient's functional outcome.   REHAB POTENTIAL: Good  CLINICAL DECISION MAKING: Stable/uncomplicated  EVALUATION COMPLEXITY: Low  PLAN:  PT FREQUENCY: 1-2x/week  PT DURATION: 6 weeks  PLANNED INTERVENTIONS: Therapeutic exercises, Therapeutic activity, Neuromuscular re-education, Balance training, Gait training, Patient/Family education, Self Care, Joint mobilization, Stair training, DME instructions, Dry Needling, Manual therapy, and Re-evaluation  PLAN FOR NEXT SESSION: Check BP. Finish FGA. Did pt call [facility]? HEP- toe taps, tandem gait. Blaze pods, FGA? Lateral weight  shifting, midline orientation, monitor HR with higher level activities with possible use of RPE scale    Kashana Breach E Arcelia Pals, PT, DPT 10/03/2022, 11:02 AM

## 2022-10-03 NOTE — Telephone Encounter (Signed)
Patient is requesting prescription for muscle relaxer. Pharmacy use [facility] [address] [place] was told to contact when needed refill.

## 2022-10-04 ENCOUNTER — Ambulatory Visit: Payer: Medicare Other | Admitting: Physical Therapy

## 2022-10-04 MED ORDER — BACLOFEN 5 MG PO TABS: 1.0000 | ORAL_TABLET | Freq: Every day | ORAL | 0 refills | Status: DC

## 2022-10-04 NOTE — Addendum Note (Signed)
Addended by: Brandon Melnick on: 10/04/2022 08:46 AM   Modules accepted: Orders

## 2022-10-04 NOTE — Telephone Encounter (Signed)
I called spoke to wife.  Relayed called baclofen 5mg  po qhs to [facility] [address].  May cause drowsiness ,  PT thought would help.  She verbalized understanding.  Appreciated this.

## 2022-10-08 ENCOUNTER — Ambulatory Visit: Payer: Medicare Other | Attending: Adult Health | Admitting: Physical Therapy

## 2022-10-08 VITALS — BP 147/76 | HR 82

## 2022-10-08 DIAGNOSIS — R208 Other disturbances of skin sensation: Secondary | ICD-10-CM | POA: Insufficient documentation

## 2022-10-08 DIAGNOSIS — R278 Other lack of coordination: Secondary | ICD-10-CM | POA: Diagnosis present

## 2022-10-08 DIAGNOSIS — M6281 Muscle weakness (generalized): Secondary | ICD-10-CM | POA: Insufficient documentation

## 2022-10-08 DIAGNOSIS — I69351 Hemiplegia and hemiparesis following cerebral infarction affecting right dominant side: Secondary | ICD-10-CM | POA: Insufficient documentation

## 2022-10-08 DIAGNOSIS — R2689 Other abnormalities of gait and mobility: Secondary | ICD-10-CM | POA: Diagnosis present

## 2022-10-08 DIAGNOSIS — R2681 Unsteadiness on feet: Secondary | ICD-10-CM | POA: Diagnosis present

## 2022-10-08 NOTE — Therapy (Signed)
OUTPATIENT PHYSICAL THERAPY NEURO TREATMENT- 10TH VISIT PROGRESS NOTE   Patient Name: Jerome Cardenas MRN: [id number] DOB:1951-01-25, 72 y.o., male Today's Date: 10/08/2022   PCP: Dr. Cathi Roan in [place]: Ward Givens, NP Physical Therapy Progress Note   Dates of Reporting Period: 09/07/22 - 10/08/22  See Note below for Objective Data and Assessment of Progress/Goals.   END OF SESSION:  PT End of Session - 10/08/22 1018     Visit Number 10    Number of Visits 13    Date for PT Re-Evaluation 10/26/22    Authorization Type Medicare    Progress Note Due on Visit 10    PT Start Time 1015    PT Stop Time 1100    PT Time Calculation (min) 45 min    Equipment Utilized During Treatment Gait belt    Activity Tolerance Patient tolerated treatment well    Behavior During Therapy WFL for tasks assessed/performed                Past Medical History:  Diagnosis Date   Cancer ([place])    prostate   CKD (chronic kidney disease) stage 2, GFR 60-89 ml/min 07/28/2022   HLD (hyperlipidemia)    Hypertension    Past Surgical History:  Procedure Laterality Date   APPENDECTOMY     CHOLECYSTECTOMY     HERNIA REPAIR     PROSTATE SURGERY     prostate cancer 2014   Patient Active Problem List   Diagnosis Date Noted   Diabetes mellitus type 2 with neurological manifestations 07/29/2022   Hypertension associated with diabetes 07/29/2022   Hyperlipidemia associated with type 2 diabetes mellitus 07/29/2022   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    ONSET DATE: 08/29/2022 (referral)  REFERRING DIAG: [id number] (ICD-10-CM) - Abnormality of gait as late effect of stroke  THERAPY DIAG:  Muscle weakness (generalized)  Other abnormalities of gait and mobility  Hemiplegia and hemiparesis following cerebral infarction affecting right dominant side  Rationale for Evaluation and Treatment: Rehabilitation  SUBJECTIVE:                                                                                                                                                                                              SUBJECTIVE STATEMENT: Pt ambulated into clinic using SB[facility]. Pt reports he started taking Baclofen on Friday night, does not think it is helping. No falls. Still is not sleeping well, has been awake since 3am today.   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 No Pt reports he frequently has pain in his RUE  VITALS Vitals:   10/08/22 1023  BP: (!) 147/76  Pulse: 82     PRECAUTIONS: Fall  WEIGHT BEARING RESTRICTIONS: No  FALLS: Has patient fallen in last 6 months? No  LIVING ENVIRONMENT: Lives with: lives with their spouse Lives in: House/apartment Stairs: Yes: Internal: 7 +7 steps; on left going up Has following equipment at home: Quad cane small base  PLOF: Independent  PATIENT GOALS: "mainly getting the movement back in my hand like it was"  OBJECTIVE:   DIAGNOSTIC FINDINGS: MRI of brain on 07/28/22 IMPRESSION: Acute left thalamocapsular infarct.  CT of head/neck on 07/29/22 IMPRESSION: 1. Short segment occlusion of the left PCA P3 segment. 2. No proximal large vessel occlusion. 3. Chronic ischemic microangiopathy.  COGNITION: Overall cognitive status: Within functional limits for tasks assessed   SENSATION: Pt reports constant numbness/tingling in R hemibody   COORDINATION: Heel to shin test: WFL on LLE, dysmetric on RLE   EDEMA: Pt reports new edema in distal RLE and RUE   POSTURE: rounded shoulders, forward head, posterior pelvic tilt, and weight shift left  LOWER EXTREMITY MMT:  Tested in Seated position   MMT Right Eval Left Eval  Hip flexion 3+ 4  Hip extension    Hip abduction 4 4  Hip adduction 3+ 4-  Hip internal rotation    Hip external  rotation    Knee flexion 2 4  Knee extension 3+ 4  Ankle dorsiflexion 4+ 4+  Ankle plantarflexion    Ankle inversion    Ankle eversion    (Blank rows = not tested)   TODAY'S TREATMENT:     Ther Act  [facility] PT Assessment - 10/08/22 1025       Functional Gait  Assessment   Gait assessed  Yes    Gait Level Surface Walks 20 ft, slow speed, abnormal gait pattern, evidence for imbalance or deviates 10-15 in outside of the 12 in walkway width. Requires more than 7 sec to ambulate 20 ft.   8.78s   Change in Gait Speed Able to change speed, demonstrates mild gait deviations, deviates 6-10 in outside of the 12 in walkway width, or no gait deviations, unable to achieve a major change in velocity, or uses a change in velocity, or uses an assistive device.    Gait with Horizontal Head Turns Performs head turns smoothly with no change in gait. Deviates no more than 6 in outside 12 in walkway width    Gait with Vertical Head Turns Performs head turns with no change in gait. Deviates no more than 6 in outside 12 in walkway width.    Gait and Pivot Turn Pivot turns safely within 3 sec and stops quickly with no loss of balance.    Step Over Obstacle Is able to step over 2 stacked shoe boxes taped together (9 in total height) without changing gait speed. No evidence of imbalance.    Gait with Narrow Base of Support Ambulates less than 4 steps heel to toe or cannot perform without assistance.    Gait with Eyes Closed Walks 20 ft, uses assistive device, slower speed, mild gait deviations, deviates 6-10 in outside 12 in walkway width. Ambulates 20 ft in less than 9 sec but greater than 7 sec.   8.4s   Ambulating Backwards Walks 20 ft, slow speed, abnormal gait pattern, evidence for imbalance, deviates 10-15 in outside 12 in walkway width.  Steps Alternating feet, must use rail.    Total Score 20    FGA comment: medium fall risk            MCTSIB: Condition 1: Avg of 3 trials: 30 sec, Condition 2: Avg of  3 trials: 30 sec, Condition 3: Avg of 3 trials: 30 sec (minor posterolateral sway to R), Condition 4: Avg of 3 trials: 30 sec (minor A/P sway), and Total Score: 120/120   NMR  In // bars, alt step up w/contralateral march on Bosu (blue side up) w/light UE support for improved ankle strategy and single leg stability. Pt performed 10 reps per side w/5-10s isometric hold. Increased difficulty on RLE > LLE.  Standing on Bosu black side up w/intermittent UE support using mirror for visual biofeedback on body position x5 minutes. Pt continues to favor L side and has posterior lean preference, requiring min A to stabilize. Min tactile cues to shift weight to R side, but pt unable to maintain.    PATIENT EDUCATION: Education details: Balance assessment outcomes, continue HEP Person educated: Patient Education method: Explanation and Demonstration Education comprehension: verbalized understanding  HOME EXERCISE PROGRAM: Access Code: [email or web address] URL: [email or web address] Date: 09/14/2022 Prepared by: Mickie Bail Judieth Mckown  Exercises - Feet together with eyes closed and head turns   - 1 x daily - 7 x weekly - 3-4 reps - 20-30 second hold - Tandem Stance in Corner  - 1 x daily - 7 x weekly - 3-4 reps - 15-30 second  hold  GOALS: Goals reviewed with patient? Yes  SHORT TERM GOALS: Target date: 09/28/2022   Pt will be independent with initial HEP for improved strength, balance, transfers and gait.  Baseline: Goal status: MET  2.  Pt will improve gait velocity to at least 1.5 ft/s w/LRAD for improved gait efficiency and performance at limited community ambulator level   Baseline: 1.27 ft/s without AD; 2.6 ft/s without AD (3/27) Goal status: MET  3.  Berg to be assessed and LTG written  Baseline: 49/56 on 09/12/22 Goal status: MET  4.  5x STS to be assessed and LTG written  Baseline: 16.62s w/hands braced on knees  Goal status: MET  5.  Stairs to be assessed and LTG updated   Baseline:  Goal status: MET   LONG TERM GOALS: Target date: 10/19/2022   Pt will be independent with final HEP for improved strength, balance, transfers and gait.  Baseline:  Goal status: INITIAL  2.  Pt will improve 5 x STS to less than or equal to 13 seconds without UE support to demonstrate improved functional strength and transfer efficiency.   Baseline: 16.62s w/hands braced on knees  Goal status: REVISED  3.  Pt will improve gait velocity to at least 2.8 ft/s w/LRAD for improved gait efficiency and reduced recurrent fall risk   Baseline: 1.27 ft/s without AD; 2.6 ft/s without AD (3/27)  Goal status: REVISED  4.  Pt will improve Berg score to 52/56 for decreased fall risk  Baseline: 49/56 Goal status: REVISED  5.  Pt will improve FOTO to 61 for improved subjective functional ability   Baseline: 47; 60 (3/27)  Goal Status: INITIAL    ASSESSMENT:  CLINICAL IMPRESSION: Emphasis of skilled PT session on dynamic balance assessment and lateral weight shifting. Pt scored a 120/120 on MCTSIB w/very minimal postural sway noted on conditions 2 and 4. Pt also scored a 20/30 on FGA, indicative of moderate fall risk, but pt mostly lost points due to time  required to complete tasks rather than instability. Pt continues to favor L side and has difficulty shifting to R, especially on unlevel surfaces. Pt did begin to take Baclofen at night for RUE pain and improved sleep, but has not been beneficial at this time. Continue POC.   OBJECTIVE IMPAIRMENTS: Abnormal gait, decreased activity tolerance, decreased balance, decreased coordination, decreased endurance, difficulty walking, decreased strength, impaired sensation, and pain  ACTIVITY LIMITATIONS: carrying, lifting, bending, squatting, stairs, transfers, bed mobility, self feeding, hygiene/grooming, locomotion level, and caring for others  PARTICIPATION LIMITATIONS: meal prep, cleaning, medication management, interpersonal  relationship, community activity, and yard work  PERSONAL FACTORS: Age, Fitness, Past/current experiences, and 1-2 comorbidities: HTN and CVA  are also affecting patient's functional outcome.   REHAB POTENTIAL: Good  CLINICAL DECISION MAKING: Stable/uncomplicated  EVALUATION COMPLEXITY: Low  PLAN:  PT FREQUENCY: 1-2x/week  PT DURATION: 6 weeks  PLANNED INTERVENTIONS: Therapeutic exercises, Therapeutic activity, Neuromuscular re-education, Balance training, Gait training, Patient/Family education, Self Care, Joint mobilization, Stair training, DME instructions, Dry Needling, Manual therapy, and Re-evaluation  PLAN FOR NEXT SESSION: Check BP. HEP- toe taps, tandem gait. Blaze pods, Lateral weight shifting, midline orientation, monitor HR with higher level activities with possible use of RPE scale    Willeen Novak E Tatayana Beshears, PT, DPT 10/08/2022, 11:05 AM

## 2022-10-11 ENCOUNTER — Encounter: Payer: Self-pay | Admitting: Physical Therapy

## 2022-10-11 ENCOUNTER — Ambulatory Visit: Payer: Medicare Other | Admitting: Occupational Therapy

## 2022-10-11 ENCOUNTER — Encounter: Payer: Self-pay | Admitting: Occupational Therapy

## 2022-10-11 ENCOUNTER — Ambulatory Visit: Payer: Medicare Other | Admitting: Physical Therapy

## 2022-10-11 VITALS — BP 166/93 | HR 70

## 2022-10-11 DIAGNOSIS — M6281 Muscle weakness (generalized): Secondary | ICD-10-CM

## 2022-10-11 DIAGNOSIS — R208 Other disturbances of skin sensation: Secondary | ICD-10-CM

## 2022-10-11 DIAGNOSIS — R2689 Other abnormalities of gait and mobility: Secondary | ICD-10-CM

## 2022-10-11 DIAGNOSIS — I69351 Hemiplegia and hemiparesis following cerebral infarction affecting right dominant side: Secondary | ICD-10-CM

## 2022-10-11 DIAGNOSIS — R278 Other lack of coordination: Secondary | ICD-10-CM

## 2022-10-11 NOTE — Therapy (Signed)
OUTPATIENT OCCUPATIONAL THERAPY NEURO EVALUATION  Patient Name: Jerome Cardenas MRN: [id number] DOB:[birth year], 72 y.o., male Today's Date: 10/11/2022  PCP: Dr. Cathi Roan in [place]: Ward Givens, NP  END OF SESSION:  OT End of Session - 10/11/22 1104     Visit Number 1    Number of Visits 21    Date for OT Re-Evaluation 12/20/22   Currently scheduled until the end of April with OT   Authorization Type Medicare    Progress Note Due on Visit 10    OT Start Time 1105    OT Stop Time 1145    OT Time Calculation (min) 40 min    Activity Tolerance Patient tolerated treatment well    Behavior During Therapy [facility] for tasks assessed/performed             Past Medical History:  Diagnosis Date   Cancer    prostate   CKD (chronic kidney disease) stage 2, GFR 60-89 ml/min 07/28/2022   HLD (hyperlipidemia)    Hypertension    Past Surgical History:  Procedure Laterality Date   APPENDECTOMY     CHOLECYSTECTOMY     HERNIA REPAIR     PROSTATE SURGERY     prostate cancer 2014   Patient Active Problem List   Diagnosis Date Noted   Diabetes mellitus type 2 with neurological manifestations 07/29/2022   Hypertension associated with diabetes 07/29/2022   Hyperlipidemia associated with type 2 diabetes mellitus 07/29/2022   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    ONSET DATE: 07/26/2022  REFERRING DIAG: [id number] (ICD-10-CM) - Hemiplegia and hemiparesis following cerebral infarction affecting right dominant side   THERAPY DIAG:  Muscle weakness (generalized)  Other lack of coordination  Hemiplegia and hemiparesis following cerebral infarction affecting right dominant side  Other disturbances of skin sensation  Rationale for Evaluation and Treatment: Rehabilitation  SUBJECTIVE:   SUBJECTIVE STATEMENT: He mostly has noticed difficulty with writing, getting up into his truck (holds onto steering wheel with right hand), and sleeping (with numbness and tingling  in RUE).   Pt accompanied by: self  PERTINENT HISTORY: "Jerome Cardenas is a 72 y.o. male presenting with left thalamic stroke outside of TPA window. High risk due to current malignancy and HLD. PMH significant for CKD stage II, HTN, GERD, vitamin D deficiency, hyperlipidemia, BPH, metastatic prostate cancer s/p radiation therapy.    Patient reported symptoms of right arm and leg weakness with sensation changes starting 1/18.  Presented to the hospital 1/20 after he developed right facial tingling that morning.  Patient was outside of tPA window.  CT head showed only chronic small vessel ischemia, no bleeding.  MRI head with acute left thalamic capsular infarct.  CTA demonstrates short segment occlusion of left PCA P3 segment and chronic ischemic microangiopathy with no large vessel occlusion.  Echo with LVEF 60-65%. No RWA. Mild LVH. G1DD.    Neurology consulted, recommended routine at risk stratification.  Was found to have A1c of 6.6 and lipids significant for triglycerides 371, HDL 23, and VLDL 74.  TSH, CBC, and BMP unremarkable aside from elevated glucose to 200."  evaluation for RUE weakness and difficulty performing ADLs   PRECAUTIONS: Fall  WEIGHT BEARING RESTRICTIONS: No  PAIN:  Are you having pain? No  FALLS: Has patient fallen in last 6 months? No  LIVING ENVIRONMENT: Lives with: lives with their spouse Lives in: House/apartment Stairs: Yes: Internal: 7 +7 steps; on left going up Has following equipment at home: Quad cane small  base  PLOF: Independent; driving; Animator, worked in [place]  PATIENT GOALS: Improve writing; get into his truck easier like before  OBJECTIVE:   HAND DOMINANCE: Right  ADLs: Overall ADLs: mod I IADLs: Community mobility: driving currently Handwriting: 25% legible; writing gets sloppier with time  MOBILITY STATUS: Independent  ACTIVITY TOLERANCE: Activity tolerance: fair  FUNCTIONAL OUTCOME MEASURES: FOTO: Slight: 67  UPPER  EXTREMITY ROM:     AROM Right (eval) Left (eval)  Shoulder flexion WNL WNL  Shoulder abduction WNL WNL  Elbow flexion WNL WNL  Elbow extension WNL WNL  Wrist flexion WNL WNL  Wrist extension WNL WNL  Wrist pronation WNL WNL  Wrist supination WNL WNL   Digit Composite Flexion WNL WNL  Digit Composite Extension WNL WNL  Digit Opposition WNL WNL  (Blank rows = not tested)  UPPER EXTREMITY MMT:     MMT Right (eval) Left (eval)  Shoulder flexion [facility] WNL  Shoulder abduction WFL WNL  Elbow flexion WFL WNL  Elbow extension WFL WNL  (Blank rows = not tested)  HAND FUNCTION: Grip strength: Right: 77.1 lbs; Left: 77.6 lbs  COORDINATION: 9 Hole Peg test: Right: 37 sec; Left: 24 sec  SENSATION: Reports paresthesias in RUE from shoulder distally  EDEMA: Reports mild to moderate swelling especially at night; no observed edema at eval.   MUSCLE TONE: WFL  COGNITION: Overall cognitive status: Within functional limits for tasks assessed  VISION: Subjective report: He went to the optometrist last month; reports no change; no double vision Baseline vision: Wears glasses all the time and Progressive lenses Visual history:  n/a  OBSERVATIONS: Pt ambulates without LOB. Appears well-kept. Glasses donned.    TODAY'S TREATMENT:                                                                                                                               Gave pt lined tracing pages for handwriting practice  PATIENT EDUCATION: Education details: OT Role and POC; Research scientist (life sciences) Person educated: Patient Education method: Explanation, Demonstration, and Handouts Education comprehension: verbalized understanding and needs further education  HOME EXERCISE PROGRAM: 10/11/2022: handwriting practice   GOALS:  SHORT TERM GOALS: Target date: 11/08/2022    Patient will demonstrate independence with initial RUE HEP. Baseline: Goal status: INITIAL  2.  Pt will report no difficulty  with combing or brushing hair. Baseline: a little difficulty Goal status: INITIAL  3.  Pt will report ability to place dish in overhead cabinet with no difficulty Baseline: a little difficulty Goal status: INITIAL  LONG TERM GOALS: Target date: 12/20/2022    Patient will demonstrate updated RUE HEP with 25% verbal cues or less for proper execution. Baseline:  Goal status: INITIAL  2.  Patient will complete nine-hole peg with use of R in 30 seconds or less. Baseline: 37 sec Goal status: INITIAL  3.  Pt will report handwriting is at least 75% of prior level. Baseline: 25% Goal status:  INITIAL  4.  Pt will complete d/c FOTO Baseline:  Goal status: INITIAL  ASSESSMENT:  CLINICAL IMPRESSION: Patient is a 72 y.o. male who was seen today for occupational therapy evaluation for CVA. 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 Patient currently presents slightly below baseline level of functioning demonstrating functional deficits and impairments as noted below. Pt would benefit from skilled OT services in the outpatient setting to work on impairments as noted below to help pt return to PLOF as able.    PERFORMANCE DEFICITS: in functional skills including ADLs, IADLs, coordination, sensation, edema, strength, pain, Fine motor control, and UE functional use.   IMPAIRMENTS: are limiting patient from ADLs, IADLs, rest and sleep, leisure, and social participation.   CO-MORBIDITIES: may have co-morbidities  that affects occupational performance. Patient will benefit from skilled OT to address above impairments and improve overall function.  MODIFICATION OR ASSISTANCE TO COMPLETE EVALUATION: No modification of tasks or assist necessary to complete an evaluation.  OT OCCUPATIONAL PROFILE AND HISTORY: Problem focused assessment: Including review of records relating to presenting  problem.  CLINICAL DECISION MAKING: LOW - limited treatment options, no task modification necessary  REHAB POTENTIAL: Excellent  EVALUATION COMPLEXITY: Low    PLAN:  OT FREQUENCY: 2x/week  OT DURATION: up to 10 weeks as needed   PLANNED INTERVENTIONS: self care/ADL training, therapeutic exercise, therapeutic activity, neuromuscular re-education, ultrasound, paraffin, fluidotherapy, moist heat, contrast bath, patient/family education, visual/perceptual remediation/compensation, energy conservation, DME and/or AE instructions, and Re-evaluation  RECOMMENDED OTHER SERVICES: None at this time  CONSULTED AND AGREED WITH PLAN OF CARE: Patient  PLAN FOR NEXT SESSION: Review handwriting; initiate coordination and putty HEPs   Dennis Bast, OT 10/11/2022, 4:29 PM

## 2022-10-11 NOTE — Therapy (Signed)
OUTPATIENT PHYSICAL THERAPY NEURO TREATMENT   Patient Name: Jerome Cardenas MRN: [id number] DOB:August 25, 1950, 72 y.o., male Today's Date: 10/11/2022   PCP: Dr. Cathi Roan in [place]: Ward Givens, NP Physical Therapy Progress Note   Dates of Reporting Period: 09/07/22 - 10/08/22  See Note below for Objective Data and Assessment of Progress/Goals.   END OF SESSION:  PT End of Session - 10/11/22 1019     Visit Number 11    Number of Visits 13    Date for PT Re-Evaluation 10/26/22    Authorization Type Medicare    PT Start Time 1017    PT Stop Time 1056    PT Time Calculation (min) 39 min    Equipment Utilized During Treatment Gait belt    Activity Tolerance Patient tolerated treatment well    Behavior During Therapy WFL for tasks assessed/performed              Past Medical History:  Diagnosis Date   Cancer    prostate   CKD (chronic kidney disease) stage 2, GFR 60-89 ml/min 07/28/2022   HLD (hyperlipidemia)    Hypertension    Past Surgical History:  Procedure Laterality Date   APPENDECTOMY     CHOLECYSTECTOMY     HERNIA REPAIR     PROSTATE SURGERY     prostate cancer 2014   Patient Active Problem List   Diagnosis Date Noted   Diabetes mellitus type 2 with neurological manifestations 07/29/2022   Hypertension associated with diabetes 07/29/2022   Hyperlipidemia associated with type 2 diabetes mellitus 07/29/2022   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    ONSET DATE: 08/29/2022 (referral)  REFERRING DIAG: [id number] (ICD-10-CM) - Abnormality of gait as late effect of stroke  THERAPY DIAG:  Muscle weakness (generalized)  Other abnormalities of gait and mobility  Hemiplegia and hemiparesis following cerebral infarction affecting right dominant side  Rationale for Evaluation and Treatment: Rehabilitation  SUBJECTIVE:                                                                                                                                                                                              SUBJECTIVE STATEMENT: Pt reports that he had a bad night last night. He was getting very bad spasms last night in is R arm/leg. Denies previous history of this happening. Denies any falls/near falls. Patient reports that his balance is his greatest concern.   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 No Pt  reports he frequently has pain in his RUE  VITALS Vitals:   10/11/22 1025  BP: (!) 166/93  Pulse: 70  SpO2: 98%    PRECAUTIONS: Fall  WEIGHT BEARING RESTRICTIONS: No  FALLS: Has patient fallen in last 6 months? No  LIVING ENVIRONMENT: Lives with: lives with their spouse Lives in: House/apartment Stairs: Yes: Internal: 7 +7 steps; on left going up Has following equipment at home: Quad cane small base  PLOF: Independent  PATIENT GOALS: "mainly getting the movement back in my hand like it was"  OBJECTIVE:   DIAGNOSTIC FINDINGS: MRI of brain on 07/28/22 IMPRESSION: Acute left thalamocapsular infarct.  CT of head/neck on 07/29/22 IMPRESSION: 1. Short segment occlusion of the left PCA P3 segment. 2. No proximal large vessel occlusion. 3. Chronic ischemic microangiopathy.  COGNITION: Overall cognitive status: Within functional limits for tasks assessed   SENSATION: Pt reports constant numbness/tingling in R hemibody   COORDINATION: Heel to shin test: WFL on LLE, dysmetric on RLE   EDEMA: Pt reports new edema in distal RLE and RUE   POSTURE: rounded shoulders, forward head, posterior pelvic tilt, and weight shift left  LOWER EXTREMITY MMT:  Tested in Seated position   MMT Right Eval Left Eval  Hip flexion 3+ 4  Hip extension    Hip abduction 4 4  Hip adduction 3+ 4-  Hip internal rotation    Hip external rotation     Knee flexion 2 4  Knee extension 3+ 4  Ankle dorsiflexion 4+ 4+  Ankle plantarflexion    Ankle inversion    Ankle eversion    (Blank rows = not tested)   TODAY'S TREATMENT:      NMR: Tandem low foam balance beam walking fwd between // bars 6 x 10 feet (CGA) Tandem low blue foam balance beam stepping laterally between // bars 6 x 10 feet (CGA) WBOS balance on BOSU with mirror for visual feedback finding midline 3 X 30-45" (CGA) 2 Minute overground gait with 2 min clockwise with 6lb ankle weights, 2 min counterclockwise with 6lb ankle weights, 2 min clockwise without weights with emphasis on dynamic balance and increased foot clearance (CGA)  (RPE: 5/10) Tandem walking heel to toe 6 x 15' (CGA - ~8-10 consecutive steps with repetition)   PATIENT EDUCATION: Education details: Continue HEP Person educated: Patient Education method: Customer service manager Education comprehension: verbalized understanding  HOME EXERCISE PROGRAM: Access Code: [email or web address] URL: [email or web address] Date: 09/14/2022 Prepared by: Mickie Bail Plaster  Exercises - Feet together with eyes closed and head turns   - 1 x daily - 7 x weekly - 3-4 reps - 20-30 second hold - Tandem Stance in Corner  - 1 x daily - 7 x weekly - 3-4 reps - 15-30 second  hold  GOALS: Goals reviewed with patient? Yes  SHORT TERM GOALS: Target date: 09/28/2022   Pt will be independent with initial HEP for improved strength, balance, transfers and gait.  Baseline: Goal status: MET  2.  Pt will improve gait velocity to at least 1.5 ft/s w/LRAD for improved gait efficiency and performance at limited community ambulator level   Baseline: 1.27 ft/s without AD; 2.6 ft/s without AD (3/27) Goal status: MET  3.  Berg to be assessed and LTG written  Baseline: 49/56 on 09/12/22 Goal status: MET  4.  5x STS to be assessed and LTG written  Baseline: 16.62s w/hands braced on knees  Goal status: MET  5.  Stairs to be  assessed and LTG updated  Baseline:  Goal status: MET   LONG TERM GOALS: Target date: 10/19/2022   Pt will be independent with final HEP for improved strength, balance, transfers and gait.  Baseline:  Goal status: INITIAL  2.  Pt will improve 5 x STS to less than or equal to 13 seconds without UE support to demonstrate improved functional strength and transfer efficiency.   Baseline: 16.62s w/hands braced on knees  Goal status: REVISED  3.  Pt will improve gait velocity to at least 2.8 ft/s w/LRAD for improved gait efficiency and reduced recurrent fall risk   Baseline: 1.27 ft/s without AD; 2.6 ft/s without AD (3/27)  Goal status: REVISED  4.  Pt will improve Berg score to 52/56 for decreased fall risk  Baseline: 49/56 Goal status: REVISED  5.  Pt will improve FOTO to 61 for improved subjective functional ability   Baseline: 47; 60 (3/27)  Goal Status: INITIAL    ASSESSMENT:  CLINICAL IMPRESSION:  Session emphasized dynamic and static balance tasks with increased foot clearance on bilaterally with use of increased proprioceptive feedback with ankle weights. Patient demonstrates significant gains with tandem gait attaining between 8-10 consecutive steps. Patient will benefit from continued skilled physical therapy services to maximize function and balance to improve safety. Continue POC.   OBJECTIVE IMPAIRMENTS: Abnormal gait, decreased activity tolerance, decreased balance, decreased coordination, decreased endurance, difficulty walking, decreased strength, impaired sensation, and pain  ACTIVITY LIMITATIONS: carrying, lifting, bending, squatting, stairs, transfers, bed mobility, self feeding, hygiene/grooming, locomotion level, and caring for others  PARTICIPATION LIMITATIONS: meal prep, cleaning, medication management, interpersonal relationship, community activity, and yard work  PERSONAL FACTORS: Age, Fitness, Past/current experiences, and 1-2 comorbidities: HTN and CVA   are also affecting patient's functional outcome.   REHAB POTENTIAL: Good  CLINICAL DECISION MAKING: Stable/uncomplicated  EVALUATION COMPLEXITY: Low  PLAN:  PT FREQUENCY: 1-2x/week  PT DURATION: 6 weeks  PLANNED INTERVENTIONS: Therapeutic exercises, Therapeutic activity, Neuromuscular re-education, Balance training, Gait training, Patient/Family education, Self Care, Joint mobilization, Stair training, DME instructions, Dry Needling, Manual therapy, and Re-evaluation  PLAN FOR NEXT SESSION: Check BP. HEP- toe taps, tandem gait. Blaze pods, Lateral weight shifting, midline orientation, monitor HR with higher level activities with possible use of RPE scale, discuss plans for D/C, work on balance and reaching tasks   Esperanza Heir, PT, DPT 10/11/2022, 12:22 PM

## 2022-10-15 ENCOUNTER — Ambulatory Visit: Payer: Medicare Other | Admitting: Physical Therapy

## 2022-10-16 ENCOUNTER — Ambulatory Visit: Payer: Medicare Other | Admitting: Physical Therapy

## 2022-10-16 VITALS — BP 154/83 | HR 80

## 2022-10-16 DIAGNOSIS — M6281 Muscle weakness (generalized): Secondary | ICD-10-CM

## 2022-10-16 DIAGNOSIS — I69351 Hemiplegia and hemiparesis following cerebral infarction affecting right dominant side: Secondary | ICD-10-CM

## 2022-10-16 DIAGNOSIS — R2689 Other abnormalities of gait and mobility: Secondary | ICD-10-CM

## 2022-10-16 NOTE — Therapy (Signed)
OUTPATIENT PHYSICAL THERAPY NEURO TREATMENT   Patient Name: Jerome Cardenas MRN: [id number] DOB:[birth year], 72 y.o., male Today's Date: 10/16/2022   PCP: Dr. Michelle [place]ren O'Connor in [place]  REFERRING PROVIDER: Butch PennyMillikan, Megan, NP    END OF SESSION:  PT End of Session - 10/16/22 1018     Visit Number 12    Number of Visits 13    Date for PT Re-Evaluation 10/26/22    Authorization Type Medicare    PT Start Time 1015    PT Stop Time 1100    PT Time Calculation (min) 45 min    Equipment Utilized During Treatment Gait belt    Activity Tolerance Patient tolerated treatment well    Behavior During Therapy WFL for tasks assessed/performed              Past Medical History:  Diagnosis Date   Cancer    prostate   CKD (chronic kidney disease) stage 2, GFR 60-89 ml/min 07/28/2022   HLD (hyperlipidemia)    Hypertension    Past Surgical History:  Procedure Laterality Date   APPENDECTOMY     CHOLECYSTECTOMY     HERNIA REPAIR     PROSTATE SURGERY     prostate cancer 2014   Patient Active Problem List   Diagnosis Date Noted   Diabetes mellitus type 2 with neurological manifestations 07/29/2022   Hypertension associated with diabetes 07/29/2022   Hyperlipidemia associated with type 2 diabetes mellitus 07/29/2022   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    ONSET DATE: 08/29/2022 (referral)  REFERRING DIAG: I69.398,R26.9 (ICD-10-CM) - Abnormality of gait as late effect of stroke  THERAPY DIAG:  Muscle weakness (generalized)  Hemiplegia and hemiparesis following cerebral infarction affecting right dominant side  Other abnormalities of gait and mobility  Rationale for Evaluation and Treatment: Rehabilitation  SUBJECTIVE:                                                                                                                                                                                             SUBJECTIVE STATEMENT: Pt reports he started taking two Baclofen pills, per  the recommendation from his oncologist. Feels as though it is helping a bit. Did sleep better last night but continues to report feelings of his arm "getting stiff". No falls.   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 No Pt reports he frequently has pain in his RUE  VITALS Vitals:   10/16/22 1027  BP: (!) 154/83  Pulse: 80  PRECAUTIONS: Fall  WEIGHT BEARING RESTRICTIONS: No  FALLS: Has patient fallen in last 6 months? No  LIVING ENVIRONMENT: Lives with: lives with their spouse Lives in: House/apartment Stairs: Yes: Internal: 7 +7 steps; on left going up Has following equipment at home: Quad cane small base  PLOF: Independent  PATIENT GOALS: "mainly getting the movement back in my hand like it was"  OBJECTIVE:   DIAGNOSTIC FINDINGS: MRI of brain on 07/28/22 IMPRESSION: Acute left thalamocapsular infarct.  CT of head/neck on 07/29/22 IMPRESSION: 1. Short segment occlusion of the left PCA P3 segment. 2. No proximal large vessel occlusion. 3. Chronic ischemic microangiopathy.  COGNITION: Overall cognitive status: Within functional limits for tasks assessed   SENSATION: Pt reports constant numbness/tingling in R hemibody   COORDINATION: Heel to shin test: WFL on LLE, dysmetric on RLE   EDEMA: Pt reports new edema in distal RLE and RUE   POSTURE: rounded shoulders, forward head, posterior pelvic tilt, and weight shift left  LOWER EXTREMITY MMT:  Tested in Seated position   MMT Right Eval Left Eval  Hip flexion 3+ 4  Hip extension    Hip abduction 4 4  Hip adduction 3+ 4-  Hip internal rotation    Hip external rotation    Knee flexion 2 4  Knee extension 3+ 4  Ankle dorsiflexion 4+ 4+  Ankle plantarflexion    Ankle inversion    Ankle eversion    (Blank rows = not  tested)   TODAY'S TREATMENT:     Ther Act  Assessed vitals (see above)  Discussed POC, as pt's cert ends this week, and pt requesting to add more visits through May. At this time, pt scheduled through April and will reassess goals at end of month. Pt verbalized understanding.    NMR: The following were performed for fall risk reduction, LE coordination, reactive balance and posterior chain strength:  Clock yourself, 50 SPM for 2 minutes showing pt clock face on phone w/CGA. Min cues for increased step length w/RLE > LLE  DL to 14' box using 33# KB, x15 reps, to prime hip hinge movement. Placed box posterior to pt to promote proper technique. CGA throughout.  KB swings using 10# KB, 3x10 reps. Cued pt to facilitate hip hinge rather than squat.  Alt fwd lunge w/10# KB staggered deadlift w/each rep, x100' w/CGA. Pt fatigued very quickly w/this and noted increased instability w/fatigue. RPE of 10/10 and pt provided short seated rest break.  Clock yourself on athletic agility setting at 50 SPM for 2 minutes without showing pt clock face. Encouraged pt to initiate jumping, which he was able to do with double step to number. RPE of 5/10 following activity.   PATIENT EDUCATION: Education details: Continue HEP, POC  Person educated: Patient Education method: Medical illustrator Education comprehension: verbalized understanding  HOME EXERCISE PROGRAM: Access Code: [email or web address] URL: [email or web address] Date: 09/14/2022 Prepared by: Alethia Berthold Dondi Burandt  Exercises - Feet together with eyes closed and head turns   - 1 x daily - 7 x weekly - 3-4 reps - 20-30 second hold - Tandem Stance in Corner  - 1 x daily - 7 x weekly - 3-4 reps - 15-30 second  hold  GOALS: Goals reviewed with patient? Yes  SHORT TERM GOALS: Target date: 09/28/2022   Pt will be independent with initial HEP for improved strength, balance, transfers and gait.  Baseline: Goal status: MET  2.  Pt will  improve gait velocity to at least 1.5 ft/s w/LRAD for  improved gait efficiency and performance at limited community ambulator level   Baseline: 1.27 ft/s without AD; 2.6 ft/s without AD (3/27) Goal status: MET  3.  Berg to be assessed and LTG written  Baseline: 49/56 on 09/12/22 Goal status: MET  4.  5x STS to be assessed and LTG written  Baseline: 16.62s w/hands braced on knees  Goal status: MET  5.  Stairs to be assessed and LTG updated  Baseline:  Goal status: MET   LONG TERM GOALS: Target date: 10/19/2022   Pt will be independent with final HEP for improved strength, balance, transfers and gait.  Baseline:  Goal status: INITIAL  2.  Pt will improve 5 x STS to less than or equal to 13 seconds without UE support to demonstrate improved functional strength and transfer efficiency.   Baseline: 16.62s w/hands braced on knees  Goal status: REVISED  3.  Pt will improve gait velocity to at least 2.8 ft/s w/LRAD for improved gait efficiency and reduced recurrent fall risk   Baseline: 1.27 ft/s without AD; 2.6 ft/s without AD (3/27)  Goal status: REVISED  4.  Pt will improve Berg score to 52/56 for decreased fall risk  Baseline: 49/56 Goal status: REVISED  5.  Pt will improve FOTO to 61 for improved subjective functional ability   Baseline: 47; 60 (3/27)  Goal Status: INITIAL    ASSESSMENT:  CLINICAL IMPRESSION: Emphasis of skilled PT session on improved power, balance strategies and posterior chain strength. Pt continues to be limited by decreased endurance and reduced weight shift to L side. Pt tolerated session well but fatigued quickly w/dynamic balance tasks. Pt could benefit from continued reactive balance strategies and dual-tasking activities for improved strength, endurance and independence. Continue POC.   OBJECTIVE IMPAIRMENTS: Abnormal gait, decreased activity tolerance, decreased balance, decreased coordination, decreased endurance, difficulty walking, decreased  strength, impaired sensation, and pain  ACTIVITY LIMITATIONS: carrying, lifting, bending, squatting, stairs, transfers, bed mobility, self feeding, hygiene/grooming, locomotion level, and caring for others  PARTICIPATION LIMITATIONS: meal prep, cleaning, medication management, interpersonal relationship, community activity, and yard work  PERSONAL FACTORS: Age, Fitness, Past/current experiences, and 1-2 comorbidities: HTN and CVA  are also affecting patient's functional outcome.   REHAB POTENTIAL: Good  CLINICAL DECISION MAKING: Stable/uncomplicated  EVALUATION COMPLEXITY: Low  PLAN:  PT FREQUENCY: 1-2x/week  PT DURATION: 6 weeks  PLANNED INTERVENTIONS: Therapeutic exercises, Therapeutic activity, Neuromuscular re-education, Balance training, Gait training, Patient/Family education, Self Care, Joint mobilization, Stair training, DME instructions, Dry Needling, Manual therapy, and Re-evaluation  PLAN FOR NEXT SESSION: Recert. Check BP. HEP- toe taps, tandem gait. Blaze pods, Lateral weight shifting, midline orientation, monitor HR with higher level activities with possible use of RPE scale, discuss plans for D/C, work on balance and reaching tasks. Mini best    Jill Alexanders Ambrose Wile, PT, DPT 10/16/2022, 11:07 AM

## 2022-10-18 ENCOUNTER — Ambulatory Visit: Payer: Medicare Other | Admitting: Physical Therapy

## 2022-10-18 ENCOUNTER — Encounter: Payer: Self-pay | Admitting: Occupational Therapy

## 2022-10-18 ENCOUNTER — Ambulatory Visit: Payer: Medicare Other | Admitting: Occupational Therapy

## 2022-10-18 VITALS — BP 156/78 | HR 74

## 2022-10-18 DIAGNOSIS — I69351 Hemiplegia and hemiparesis following cerebral infarction affecting right dominant side: Secondary | ICD-10-CM

## 2022-10-18 DIAGNOSIS — R2689 Other abnormalities of gait and mobility: Secondary | ICD-10-CM

## 2022-10-18 DIAGNOSIS — M6281 Muscle weakness (generalized): Secondary | ICD-10-CM | POA: Diagnosis not present

## 2022-10-18 DIAGNOSIS — R208 Other disturbances of skin sensation: Secondary | ICD-10-CM

## 2022-10-18 DIAGNOSIS — R278 Other lack of coordination: Secondary | ICD-10-CM

## 2022-10-18 NOTE — Therapy (Signed)
OUTPATIENT PHYSICAL THERAPY NEURO TREATMENT- RECERTIFICATION   Patient Name: Jerome Cardenas MRN: [id number] DOB:1950/11/29, 72 y.o., male Today's Date: 10/18/2022   PCP: Dr. Michelle Nasuti in [place]  REFERRING PROVIDER: Butch Penny, NP    END OF SESSION:  PT End of Session - 10/18/22 1022     Visit Number 13    Number of Visits 27   Recert   Date for PT Re-Evaluation 12/06/22    Authorization Type Medicare    PT Start Time 1019    PT Stop Time 1105    PT Time Calculation (min) 46 min    Equipment Utilized During Treatment Gait belt    Activity Tolerance Patient tolerated treatment well    Behavior During Therapy WFL for tasks assessed/performed               Past Medical History:  Diagnosis Date   Cancer    prostate   CKD (chronic kidney disease) stage 2, GFR 60-89 ml/min 07/28/2022   HLD (hyperlipidemia)    Hypertension    Past Surgical History:  Procedure Laterality Date   APPENDECTOMY     CHOLECYSTECTOMY     HERNIA REPAIR     PROSTATE SURGERY     prostate cancer 2014   Patient Active Problem List   Diagnosis Date Noted   Diabetes mellitus type 2 with neurological manifestations 07/29/2022   Hypertension associated with diabetes 07/29/2022   Hyperlipidemia associated with type 2 diabetes mellitus 07/29/2022   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    ONSET DATE: 08/29/2022 (referral)  REFERRING DIAG: I69.398,R26.9 (ICD-10-CM) - Abnormality of gait as late effect of stroke  THERAPY DIAG:  Muscle weakness (generalized)  Hemiplegia and hemiparesis following cerebral infarction affecting right dominant side  Other abnormalities of gait and mobility  Rationale for Evaluation and Treatment: Rehabilitation  SUBJECTIVE:                                                                                                                                                                                             SUBJECTIVE STATEMENT: Pt reports he is sore  following last session, rating it as a 5/10. No falls.   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 No Pt reports he frequently has pain in his RUE  VITALS Vitals:   10/18/22 1035  BP: (!) 156/78  Pulse: 74      PRECAUTIONS: Fall  WEIGHT BEARING RESTRICTIONS: No  FALLS: Has patient fallen in last 6 months? No  LIVING ENVIRONMENT: Lives with: lives with their spouse Lives in: House/apartment Stairs: Yes: Internal: 7 +7 steps; on left going up Has following equipment at home: Quad cane small base  PLOF: Independent  PATIENT GOALS: "mainly getting the movement back in my hand like it was"  OBJECTIVE:   DIAGNOSTIC FINDINGS: MRI of brain on 07/28/22 IMPRESSION: Acute left thalamocapsular infarct.  CT of head/neck on 07/29/22 IMPRESSION: 1. Short segment occlusion of the left PCA P3 segment. 2. No proximal large vessel occlusion. 3. Chronic ischemic microangiopathy.  COGNITION: Overall cognitive status: Within functional limits for tasks assessed   SENSATION: Pt reports constant numbness/tingling in R hemibody   COORDINATION: Heel to shin test: WFL on LLE, dysmetric on RLE   EDEMA: Pt reports new edema in distal RLE and RUE   POSTURE: rounded shoulders, forward head, posterior pelvic tilt, and weight shift left  LOWER EXTREMITY MMT:  Tested in Seated position   MMT Right Eval Left Eval  Hip flexion 3+ 4  Hip extension    Hip abduction 4 4  Hip adduction 3+ 4-  Hip internal rotation    Hip external rotation    Knee flexion 2 4  Knee extension 3+ 4  Ankle dorsiflexion 4+ 4+  Ankle plantarflexion    Ankle inversion    Ankle eversion    (Blank rows = not tested)   TODAY'S TREATMENT:     Ther Act  Discussed home modifications around bedroom and bathroom, as pt very fearful of falling  while ambulating to bathroom at night and falling down stairs, as these are adjacent to bedroom door. Pt has no throw rugs or furniture around this area and does have a small light in space. Encouraged pt to obtain baby gate or barrier to place in front of steps at night to minimize risk of falling down steps. Pt verbalized understanding.   LTG Assessment    [facility] PT Assessment - 10/18/22 1038       Transfers   Five time sit to stand comments  16.15s w/o UE support      Ambulation/Gait   Gait velocity 32.8' over 11.97s = 2.74 ft/s no AD            NMR  Educated pt on and demonstrated proper floor transfer x2 to ensure proper sequencing and safety. Pt had no difficulty w/sequence of transfer and only required light UE support to stabilize from half-kneel > standing.  In quadruped on mat, placed 6 blaze pods around pt (4 on floor mat, 2 on mat table) on random reach setting for 2 minute intervals x2 for improved UE coordination, core stability and lateral reaching:  Round 1: 48 hits w/S* Round 2: placed dynadisc under knees for added balance challenge, 52 hits w/CGA   PATIENT EDUCATION: Education details: Goal outcomes, plan for next session, see ther Act session  Person educated: Patient Education method: Explanation and Demonstration Education comprehension: verbalized understanding  HOME EXERCISE PROGRAM: Access Code: [email or web address] URL: [email or web address] Date: 09/14/2022 Prepared by: Alethia Berthold Rebbeca Sheperd  Exercises - Feet together with eyes closed and head turns   - 1 x daily - 7 x weekly - 3-4 reps - 20-30 second hold - Tandem Stance in Corner  - 1 x daily - 7 x weekly - 3-4 reps - 15-30 second  hold  GOALS: Goals reviewed with patient? Yes  SHORT TERM GOALS: Target date: 09/28/2022   Pt will be independent with initial HEP for improved strength, balance, transfers and gait.  Baseline:  Goal status: MET  2.  Pt will improve gait velocity to at least 1.5 ft/s  w/LRAD for improved gait efficiency and performance at limited community ambulator level   Baseline: 1.27 ft/s without AD; 2.6 ft/s without AD (3/27) Goal status: MET  3.  Berg to be assessed and LTG written  Baseline: 49/56 on 09/12/22 Goal status: MET  4.  5x STS to be assessed and LTG written  Baseline: 16.62s w/hands braced on knees  Goal status: MET  5.  Stairs to be assessed and LTG updated  Baseline:  Goal status: MET   LONG TERM GOALS: Target date: 10/19/2022   Pt will be independent with final HEP for improved strength, balance, transfers and gait.  Baseline:  Goal status: IN PROGRESS  2.  Pt will improve 5 x STS to less than or equal to 13 seconds without UE support to demonstrate improved functional strength and transfer efficiency.   Baseline: 16.62s w/hands braced on knees; 16.15s w/o UE support (4/11) Goal status: IN PROGRESS  3.  Pt will improve gait velocity to at least 2.8 ft/s w/LRAD for improved gait efficiency and reduced recurrent fall risk   Baseline: 1.27 ft/s without AD; 2.6 ft/s without AD (3/27); 2.74 ft/s no AD (4/11) Goal status: IN PROGRESS  4.  Pt will improve Berg score to 52/56 for decreased fall risk  Baseline: 49/56 Goal status: REVISED  5.  Pt will improve FOTO to 61 for improved subjective functional ability   Baseline: 47; 60 (3/27)  Goal Status: INITIAL    ASSESSMENT:  CLINICAL IMPRESSION: Emphasis of skilled PT session on initiating LTG assessment, core stability and floor transfers. Pt has not met any LTGs this date but has made improvements, improving his gait speed and 5x STS, just not to goal level. Remainder of session spent educating pt on home modifications for reduced fall risk and practicing floor transfers, as pt must get up several times during the night to use restroom and is nervous about falling. Pt did well w/floor transfer but will benefit from dynamic balance challenges w/reduced visual input. Pt in agreement to add  more appointments to work on dynamic balance w/vestibular focus, high amplitude movement and dual-tasking for improved safety and independence. Will send cert today. Continue POC.   OBJECTIVE IMPAIRMENTS: Abnormal gait, decreased activity tolerance, decreased balance, decreased coordination, decreased endurance, difficulty walking, decreased strength, impaired sensation, and pain  ACTIVITY LIMITATIONS: carrying, lifting, bending, squatting, stairs, transfers, bed mobility, self feeding, hygiene/grooming, locomotion level, and caring for others  PARTICIPATION LIMITATIONS: meal prep, cleaning, medication management, interpersonal relationship, community activity, and yard work  PERSONAL FACTORS: Age, Fitness, Past/current experiences, and 1-2 comorbidities: HTN and CVA  are also affecting patient's functional outcome.   REHAB POTENTIAL: Good  CLINICAL DECISION MAKING: Stable/uncomplicated  EVALUATION COMPLEXITY: Low  PLAN:  PT FREQUENCY: 1-2x/week  PT DURATION: 6 weeks  PLANNED INTERVENTIONS: Therapeutic exercises, Therapeutic activity, Neuromuscular re-education, Balance training, Gait training, Patient/Family education, Self Care, Joint mobilization, Stair training, DME instructions, Dry Needling, Manual therapy, and Re-evaluation  PLAN FOR NEXT SESSION: Finish goal assessment and write new goals (I was going to do the Minibest because I think he will reach ceiling for Berg. I did the FGA on 4/1 and he got a 20). Check BP. Add dynamic balance w/EC to HEP (standing marches w/EC)   Jill AlexandersJannah E Antonius Hartlage, PT, DPT 10/18/2022, 11:09 AM

## 2022-10-18 NOTE — Patient Instructions (Signed)
  Coordination Activities  Perform the following activities for 10 minutes 1-2 times per day with right hand(s).  Rotate ball in fingertips (clockwise and counter-clockwise). Toss ball in air and catch with the same hand. Flip cards 1 at a time as fast as you can. Deal cards with your thumb (Hold deck in hand and push card off top with thumb). Rotate one card in hand (clockwise and counter-clockwise). Pick up coins one at a time until you get 5 in your hand, then move coins from palm to fingertips to stack one at a time. Twirl pen between fingers. Practice writing  Screw together nuts and bolts, then unfasten.  1. Grip Strengthening (Resistive Putty)   Squeeze putty using thumb and all fingers. Repeat _20___ times. Do __2__ sessions per day.   2. Roll putty into tube on table and pinch between first two fingers and thumb x 10 reps. Do 2 sessions per day

## 2022-10-18 NOTE — Therapy (Signed)
OUTPATIENT OCCUPATIONAL THERAPY NEURO TREATMENT   Patient Name: Jerome Cardenas MRN: [id number] DOB:22-Mar-1951, 72 y.o., male Today's Date: 10/18/2022  PCP: Dr. Michelle Nasuti in [place]  REFERRING PROVIDER: Butch Penny, NP  END OF SESSION:  OT End of Session - 10/18/22 0930     Visit Number 2    Number of Visits 21    Date for OT Re-Evaluation 12/20/22   Currently scheduled until the end of April with OT   Authorization Type Medicare    Progress Note Due on Visit 10    OT Start Time 0930    OT Stop Time 1015    OT Time Calculation (min) 45 min    Activity Tolerance Patient tolerated treatment well    Behavior During Therapy [facility] for tasks assessed/performed             Past Medical History:  Diagnosis Date   Cancer    prostate   CKD (chronic kidney disease) stage 2, GFR 60-89 ml/min 07/28/2022   HLD (hyperlipidemia)    Hypertension    Past Surgical History:  Procedure Laterality Date   APPENDECTOMY     CHOLECYSTECTOMY     HERNIA REPAIR     PROSTATE SURGERY     prostate cancer 2014   Patient Active Problem List   Diagnosis Date Noted   Diabetes mellitus type 2 with neurological manifestations 07/29/2022   Hypertension associated with diabetes 07/29/2022   Hyperlipidemia associated with type 2 diabetes mellitus 07/29/2022   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 session but no pain  Pt accompanied by: self  PERTINENT HISTORY: "Jerome Cardenas is a 72 y.o. male presenting with left thalamic stroke outside of TPA  window. High risk due to current malignancy and HLD. PMH significant for CKD stage II, HTN, GERD, vitamin D deficiency, hyperlipidemia, BPH, metastatic prostate cancer s/p radiation therapy.    Patient reported symptoms of right arm and leg weakness with sensation changes starting 1/18.  Presented to the hospital 1/20 after he developed right facial tingling that morning.  Patient was outside of tPA window.  CT head showed only chronic small vessel ischemia, no bleeding.  MRI head with acute left thalamic capsular infarct.  CTA demonstrates short segment occlusion of left PCA P3 segment and chronic ischemic microangiopathy with no large vessel occlusion.  Echo with LVEF 60-65%. No RWA. Mild LVH. G1DD.    Neurology consulted, recommended routine at risk stratification.  Was found to have A1c of 6.6 and lipids significant for triglycerides 371, HDL 23, and VLDL 74.  TSH, CBC, and BMP unremarkable aside from elevated glucose to 200."  evaluation for RUE weakness and difficulty performing ADLs   PRECAUTIONS: Fall  WEIGHT BEARING RESTRICTIONS: No  PAIN:  Are you having pain? No  FALLS: Has patient fallen in last 6 months? No  LIVING ENVIRONMENT: Lives with: lives with their spouse Lives in: House/apartment Stairs: Yes: Internal: 7 +7 steps; on left going up Has following equipment at home: Quad cane small base  PLOF: Independent; driving; Office manager, worked in Office manager  PATIENT GOALS: Improve writing; get into his truck easier  like before  OBJECTIVE:   HAND DOMINANCE: Right  ADLs: Overall ADLs: mod I IADLs: Community mobility: driving currently Handwriting: 25% legible; writing gets sloppier with time  MOBILITY STATUS: Independent  ACTIVITY TOLERANCE: Activity tolerance: fair  FUNCTIONAL OUTCOME MEASURES: FOTO: Slight: 67  UPPER EXTREMITY ROM:     AROM Right (eval) Left (eval)  Shoulder flexion WNL WNL  Shoulder abduction WNL WNL  Elbow flexion WNL WNL  Elbow extension  WNL WNL  Wrist flexion WNL WNL  Wrist extension WNL WNL  Wrist pronation WNL WNL  Wrist supination WNL WNL   Digit Composite Flexion WNL WNL  Digit Composite Extension WNL WNL  Digit Opposition WNL WNL  (Blank rows = not tested)  UPPER EXTREMITY MMT:     MMT Right (eval) Left (eval)  Shoulder flexion [facility] W[facility]  Shoulder abduction WFL WNL  Elbow flexion WFL WNL  Elbow extension WFL WNL  (Blank rows = not tested)  HAND FUNCTION: Grip strength: Right: 77.1 lbs; Left: 77.6 lbs  COORDINATION: 9 Hole Peg test: Right: 37 sec; Left: 24 sec  SENSATION: Reports paresthesias in RUE from shoulder distally  EDEMA: Reports mild to moderate swelling especially at night; no observed edema at eval.   MUSCLE TONE: WFL  COGNITION: Overall cognitive status: Within functional limits for tasks assessed  VISION: Subjective report: He went to the optometrist last month; reports no change; no double vision Baseline vision: Wears glasses all the time and Progressive lenses Visual history:  n/a  OBSERVATIONS: Pt ambulates without LOB. Appears well-kept. Glasses donned.    TODAY'S TREATMENT:                                                                                                                               Pt issued coordination and putty HEP - see pt instructions for details. Pt issued green resistance putty   *Noted mild Rt scapular hypermobility and difficulty with end range shoulder flexion RUE  Pt brought in handwriting worksheet with good accuracy noted. Pt practiced writing name and sentences with and without built up pen - pt w/ no difference in legibility and prefers regular pen. Pt with approx 95% legibility in print and mild micrographia noted near end of sentence   UBE x 8 min, level 3 for normal reciprocal movement pattern and UB conditioning/strengthening   PATIENT EDUCATION: Education details: coordination and putty HEP  Person educated: Patient Education method:  Explanation, Demonstration, and Handouts Education comprehension: verbalized understanding and returned demonstration  HOME EXERCISE PROGRAM: 10/11/2022: handwriting practice 10/18/22: coordination and putty HEP    GOALS:  SHORT TERM GOALS: Target date: 11/08/2022    Patient will demonstrate independence with initial RUE HEP. Baseline: Goal status: IN PROGRESS  2.  Pt will report no difficulty with combing or brushing hair. Baseline: a little difficulty Goal status: INITIAL  3.  Pt will report ability to place dish in overhead cabinet with no difficulty Baseline: a little difficulty  Goal status: INITIAL  LONG TERM GOALS: Target date: 12/20/2022    Patient will demonstrate updated RUE HEP with 25% verbal cues or less for proper execution. Baseline:  Goal status: INITIAL  2.  Patient will complete nine-hole peg with use of R in 30 seconds or less. Baseline: 37 sec Goal status: INITIAL  3.  Pt will report handwriting is at least 75% of prior level. Baseline: 25% Goal status: INITIAL  4.  Pt will complete d/c FOTO Baseline:  Goal status: INITIAL  ASSESSMENT:  CLINICAL IMPRESSION: Patient returns after initial evaluation to initiate HEP for Rt hand coordination and strength - which he is tolerating well. Pt with handwriting legibility at 95% or greater, however pt reports difference since stroke, and encouraged to keep practicing. Pt does not need adaptive pen.  PERFORMANCE DEFICITS: in functional skills including ADLs, IADLs, coordination, sensation, edema, strength, pain, Fine motor control, and UE functional use.   IMPAIRMENTS: are limiting patient from ADLs, IADLs, rest and sleep, leisure, and social participation.   CO-MORBIDITIES: may have co-morbidities  that affects occupational performance. Patient will benefit from skilled OT to address above impairments and improve overall function.  MODIFICATION OR ASSISTANCE TO COMPLETE EVALUATION: No modification of tasks or  assist necessary to complete an evaluation.  OT OCCUPATIONAL PROFILE AND HISTORY: Problem focused assessment: Including review of records relating to presenting problem.  CLINICAL DECISION MAKING: LOW - limited treatment options, no task modification necessary  REHAB POTENTIAL: Excellent  EVALUATION COMPLEXITY: Low    PLAN:  OT FREQUENCY: 2x/week  OT DURATION: up to 10 weeks as needed   PLANNED INTERVENTIONS: self care/ADL training, therapeutic exercise, therapeutic activity, neuromuscular re-education, ultrasound, paraffin, fluidotherapy, moist heat, contrast bath, patient/family education, visual/perceptual remediation/compensation, energy conservation, DME and/or AE instructions, and Re-evaluation  RECOMMENDED OTHER SERVICES: None at this time  CONSULTED AND AGREED WITH PLAN OF CARE: Patient  PLAN FOR NEXT SESSION: work on scapula retraction and stabilization prone, high level shoulder flexion as able. Review coordination/putty HEP prn   Sheran Lawless, OT 10/18/2022, 9:31 AM

## 2022-10-22 ENCOUNTER — Ambulatory Visit: Payer: Medicare Other | Admitting: Occupational Therapy

## 2022-10-22 ENCOUNTER — Encounter: Payer: Self-pay | Admitting: Physical Therapy

## 2022-10-22 ENCOUNTER — Encounter: Payer: Self-pay | Admitting: Occupational Therapy

## 2022-10-22 ENCOUNTER — Ambulatory Visit: Payer: Medicare Other | Admitting: Physical Therapy

## 2022-10-22 VITALS — BP 155/87 | HR 78

## 2022-10-22 DIAGNOSIS — M6281 Muscle weakness (generalized): Secondary | ICD-10-CM

## 2022-10-22 DIAGNOSIS — I69351 Hemiplegia and hemiparesis following cerebral infarction affecting right dominant side: Secondary | ICD-10-CM

## 2022-10-22 DIAGNOSIS — R208 Other disturbances of skin sensation: Secondary | ICD-10-CM

## 2022-10-22 DIAGNOSIS — R2689 Other abnormalities of gait and mobility: Secondary | ICD-10-CM

## 2022-10-22 DIAGNOSIS — R278 Other lack of coordination: Secondary | ICD-10-CM

## 2022-10-22 NOTE — Therapy (Signed)
OUTPATIENT OCCUPATIONAL THERAPY NEURO TREATMENT   Patient Name: Jerome Cardenas MRN: [id number] DOB:March 15, 1951, 72 y.o., male Today's Date: 10/22/2022  PCP: Dr. Michelle Nasuti in [place]  REFERRING PROVIDER: Butch Penny, NP  END OF SESSION:    Past Medical History:  Diagnosis Date   Cancer    prostate   CKD (chronic kidney disease) stage 2, GFR 60-89 ml/min 07/28/2022   HLD (hyperlipidemia)    Hypertension    Past Surgical History:  Procedure Laterality Date   APPENDECTOMY     CHOLECYSTECTOMY     HERNIA REPAIR     PROSTATE SURGERY     prostate cancer 2014   Patient Active Problem List   Diagnosis Date Noted   Diabetes mellitus type 2 with neurological manifestations 07/29/2022   Hypertension associated with diabetes 07/29/2022   Hyperlipidemia associated with type 2 diabetes mellitus 07/29/2022   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    ONSET DATE: 07/26/2022  REFERRING DIAG: Z20.802 (ICD-10-CM) - Hemiplegia and hemiparesis following cerebral infarction affecting right dominant side   THERAPY DIAG:  No diagnosis found.  Rationale for Evaluation and Treatment: Rehabilitation  SUBJECTIVE:   SUBJECTIVE STATEMENT: He states he does not do too well with his card exercises.   Pt accompanied by: self  PERTINENT HISTORY: "Jerome Cardenas is a 72 y.o. male presenting with left thalamic stroke outside of TPA window. High risk due to current malignancy and HLD. PMH significant for CKD stage II, HTN, GERD, vitamin D deficiency, hyperlipidemia, BPH, metastatic prostate cancer s/p radiation therapy.    Patient reported symptoms of right arm and leg weakness with sensation changes starting 1/18.  Presented to the hospital 1/20 after he developed right facial tingling that morning.  Patient was outside of tPA window.  CT head showed only chronic small vessel ischemia, no bleeding.  MRI head with acute left thalamic capsular infarct.  CTA demonstrates short segment occlusion of left  PCA P3 segment and chronic ischemic microangiopathy with no large vessel occlusion.  Echo with LVEF 60-65%. No RWA. Mild LVH. G1DD.    Neurology consulted, recommended routine at risk stratification.  Was found to have A1c of 6.6 and lipids significant for triglycerides 371, HDL 23, and VLDL 74.  TSH, CBC, and BMP unremarkable aside from elevated glucose to 200."  evaluation for RUE weakness and difficulty performing ADLs   PRECAUTIONS: Fall  WEIGHT BEARING RESTRICTIONS: No  PAIN:  Are you having pain? No  FALLS: Has patient fallen in last 6 months? No  LIVING ENVIRONMENT: Lives with: lives with their spouse Lives in: House/apartment Stairs: Yes: Internal: 7 +7 steps; on left going up Has following equipment at home: Quad cane small base  PLOF: Independent; driving; Office manager, worked in Office manager  PATIENT GOALS: Improve writing; get into his truck easier like before  OBJECTIVE:   HAND DOMINANCE: Right  ADLs: Overall ADLs: mod I IADLs: Community mobility: driving currently Handwriting: 25% legible; writing gets sloppier with time  MOBILITY STATUS: Independent  ACTIVITY TOLERANCE: Activity tolerance: fair  FUNCTIONAL OUTCOME MEASURES: FOTO: Slight: 67  UPPER EXTREMITY ROM:     AROM Right (eval) Left (eval)  Shoulder flexion WNL WNL  Shoulder abduction WNL WNL  Elbow flexion WNL WNL  Elbow extension WNL WNL  Wrist flexion WNL WNL  Wrist extension WNL WNL  Wrist pronation WNL WNL  Wrist supination WNL WNL   Digit Composite Flexion WNL WNL  Digit Composite Extension WNL WNL  Digit Opposition WNL WNL  (Blank rows = not tested)  UPPER EXTREMITY MMT:     MMT Right (eval) Left (eval)  Shoulder flexion [facility] WNL  Shoulder abduction WFL WNL  Elbow flexion WFL WNL  Elbow extension WFL WNL  (Blank rows = not tested)  HAND FUNCTION: Grip strength: Right: 77.1 lbs; Left: 77.6 lbs  COORDINATION: 9 Hole Peg test: Right: 37 sec; Left: 24  sec  SENSATION: Reports paresthesias in RUE from shoulder distally  EDEMA: Reports mild to moderate swelling especially at night; no observed edema at eval.   MUSCLE TONE: WFL  COGNITION: Overall cognitive status: Within functional limits for tasks assessed  VISION: Subjective report: He went to the optometrist last month; reports no change; no double vision Baseline vision: Wears glasses all the time and Progressive lenses Visual history:  n/a  OBSERVATIONS: Pt ambulates without LOB. Appears well-kept. Glasses donned.    TODAY'S TREATMENT:                                                                                                                               - Therapeutic exercises completed for duration as noted below including:  Reviewed coordination and putty HEPs as noted in pt instructions. Min cueing to slow pace with putty grip  Bridging, prone,   PATIENT EDUCATION: Education details: coordination and putty HEP review Person educated: Patient Education method: Explanation, Demonstration, and Handouts Education comprehension: verbalized understanding and returned demonstration  HOME EXERCISE PROGRAM: 10/11/2022: handwriting practice 10/18/22: coordination and putty HEP    GOALS:  SHORT TERM GOALS: Target date: 11/08/2022    Patient will demonstrate independence with initial RUE HEP. Baseline: Goal status: IN PROGRESS  2.  Pt will report no difficulty with combing or brushing hair. Baseline: a little difficulty Goal status: INITIAL  3.  Pt will report ability to place dish in overhead cabinet with no difficulty Baseline: a little difficulty Goal status: INITIAL  LONG TERM GOALS: Target date: 12/20/2022    Patient will demonstrate updated RUE HEP with 25% verbal cues or less for proper execution. Baseline:  Goal status: INITIAL  2.  Patient will complete nine-hole peg with use of R in 30 seconds or less. Baseline: 37 sec Goal status: INITIAL  3.   Pt will report handwriting is at least 75% of prior level. Baseline: 25% Goal status: INITIAL  4.  Pt will complete d/c FOTO Baseline:  Goal status: INITIAL  ASSESSMENT:  CLINICAL IMPRESSION: ***  PERFORMANCE DEFICITS: in functional skills including ADLs, IADLs, coordination, sensation, edema, strength, pain, Fine motor control, and UE functional use.   IMPAIRMENTS: are limiting patient from ADLs, IADLs, rest and sleep, leisure, and social participation.   CO-MORBIDITIES: may have co-morbidities  that affects occupational performance. Patient will benefit from skilled OT to address above impairments and improve overall function.  REHAB POTENTIAL: Excellent  PLAN:  OT FREQUENCY: 2x/week  OT DURATION: up to 10 weeks as needed   PLANNED INTERVENTIONS: self care/ADL training, therapeutic exercise, therapeutic activity, neuromuscular  re-education, ultrasound, paraffin, fluidotherapy, moist heat, contrast bath, patient/family education, visual/perceptual remediation/compensation, energy conservation, DME and/or AE instructions, and Re-evaluation  RECOMMENDED OTHER SERVICES: None at this time  CONSULTED AND AGREED WITH PLAN OF CARE: Patient  PLAN FOR NEXT SESSION: work on scapula retraction and stabilization prone, high level shoulder flexion as able. Review coordination/putty HEP prn   Delana Meyer, OT 10/22/2022, 11:45 AM

## 2022-10-22 NOTE — Therapy (Signed)
OUTPATIENT PHYSICAL THERAPY NEURO TREATMENT    Patient Name: Jerome Cardenas MRN: [id number] DOB:Jul 10, 1950, 72 y.o., male Today's Date: 10/22/2022   PCP: Dr. Michelle Nasuti in [place]  REFERRING PROVIDER: Butch Penny, NP    END OF SESSION:  PT End of Session - 10/22/22 1234     Visit Number 14    Number of Visits 27    Date for PT Re-Evaluation 12/06/22    Authorization Type Medicare    Progress Note Due on Visit 10    PT Start Time 1231    PT Stop Time 1313    PT Time Calculation (min) 42 min    Equipment Utilized During Treatment Gait belt    Activity Tolerance Patient tolerated treatment well    Behavior During Therapy WFL for tasks assessed/performed               Past Medical History:  Diagnosis Date   Cancer    prostate   CKD (chronic kidney disease) stage 2, GFR 60-89 ml/min 07/28/2022   HLD (hyperlipidemia)    Hypertension    Past Surgical History:  Procedure Laterality Date   APPENDECTOMY     CHOLECYSTECTOMY     HERNIA REPAIR     PROSTATE SURGERY     prostate cancer 2014   Patient Active Problem List   Diagnosis Date Noted   Diabetes mellitus type 2 with neurological manifestations 07/29/2022   Hypertension associated with diabetes 07/29/2022   Hyperlipidemia associated with type 2 diabetes mellitus 07/29/2022   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    ONSET DATE: 08/29/2022 (referral)  REFERRING DIAG: I69.398,R26.9 (ICD-10-CM) - Abnormality of gait as late effect of stroke  THERAPY DIAG:  Other disturbances of skin sensation  Muscle weakness (generalized)  Other abnormalities of gait and mobility  Rationale for Evaluation and Treatment: Rehabilitation  SUBJECTIVE:                                                                                                                                                                                             SUBJECTIVE STATEMENT: Pt reports that he has been having a lot of pain on his right  side. Little improvement with the baclofen. Patient denies falls/near falls and other acute changes.   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 Yes: NPRS scale: 2/10 Pain location: all over on my right side Pain description: achy Aggravating factors: exercise Relieving factors: massage  VITALS Vitals:   10/22/22 1239  BP: (!) 155/87  Pulse: 78       PRECAUTIONS: Fall  WEIGHT BEARING RESTRICTIONS: No  FALLS: Has patient fallen in last 6 months? No  LIVING ENVIRONMENT: Lives with: lives with their spouse Lives in: House/apartment Stairs: Yes: Internal: 7 +7 steps; on left going up Has following equipment at home: Quad cane small base  PLOF: Independent  PATIENT GOALS: "mainly getting the movement back in my hand like it was"  OBJECTIVE:   DIAGNOSTIC FINDINGS: MRI of brain on 07/28/22 IMPRESSION: Acute left thalamocapsular infarct.  CT of head/neck on 07/29/22 IMPRESSION: 1. Short segment occlusion of the left PCA P3 segment. 2. No proximal large vessel occlusion. 3. Chronic ischemic microangiopathy.  COGNITION: Overall cognitive status: Within functional limits for tasks assessed   SENSATION: Pt reports constant numbness/tingling in R hemibody   COORDINATION: Heel to shin test: WFL on LLE, dysmetric on RLE   EDEMA: Pt reports new edema in distal RLE and RUE   POSTURE: rounded shoulders, forward head, posterior pelvic tilt, and weight shift left  LOWER EXTREMITY MMT:  Tested in Seated position   MMT Right Eval Left Eval  Hip flexion 3+ 4  Hip extension    Hip abduction 4 4  Hip adduction 3+ 4-  Hip internal rotation    Hip external rotation    Knee flexion 2 4  Knee extension 3+ 4  Ankle dorsiflexion 4+ 4+  Ankle plantarflexion    Ankle inversion    Ankle eversion    (Blank  rows = not tested)   TODAY'S TREATMENT:     Vitals:   10/22/22 1239  BP: (!) 155/87  Pulse: 78    Ther Act:   Assessed remaining goals/discussed findings with patient  [facility] PT Assessment - 10/22/22 0001       Standardized Balance Assessment   Standardized Balance Assessment Mini-BESTest      Berg Balance Test   Sit to Stand Able to stand without using hands and stabilize independently    Standing Unsupported Able to stand safely 2 minutes    Sitting with Back Unsupported but Feet Supported on Floor or Stool Able to sit safely and securely 2 minutes    Stand to Sit Sits safely with minimal use of hands    Transfers Able to transfer safely, minor use of hands    Standing Unsupported with Eyes Closed Able to stand 10 seconds safely    Standing Unsupported with Feet Together Able to place feet together independently and stand 1 minute safely    From Standing, Reach Forward with Outstretched Arm Can reach confidently >25 cm (10")    From Standing Position, Pick up Object from Floor Able to pick up shoe safely and easily    From Standing Position, Turn to Look Behind Over each Shoulder Looks behind from both sides and weight shifts well    Turn 360 Degrees Able to turn 360 degrees safely one side only in 4 seconds or less    Standing Unsupported, Alternately Place Feet on Step/Stool Able to stand independently and safely and complete 8 steps in 20 seconds    Standing Unsupported, One Foot in Front Able to plae foot ahead of the other independently and hold 30 seconds    Standing on One Leg Able to lift leg independently and hold 5-10 seconds    Total Score 53      Mini-BESTest   Sit To Stand Normal: Comes to stand without use of hands and stabilizes independently.    Rise to  Toes Moderate: Heels up, but not full range (smaller than when holding hands), OR noticeable instability for 3 s.    Stand on one leg (left) Moderate: < 20 s    Stand on one leg (right) Moderate: < 20 s     Stand on one leg - lowest score 1    Compensatory Stepping Correction - Forward Moderate: More than one step is required to recover equilibrium    Compensatory Stepping Correction - Backward No step, OR would fall if not caught, OR falls spontaneously.    Compensatory Stepping Correction - Left Lateral Moderate: Several steps to recover equilibrium    Compensatory Stepping Correction - Right Lateral Moderate: Several steps to recover equilibrium    Stepping Corredtion Lateral - lowest score 1    Stance - Feet together, eyes open, firm surface  Normal: 30s    Stance - Feet together, eyes closed, foam surface  Normal: 30s    Incline - Eyes Closed Normal: Stands independently 30s and aligns with gravity    Change in Gait Speed Moderate: Unable to change walking speed or signs of imbalance    Walk with head turns - Horizontal Moderate: performs head turns with reduction in gait speed.    Walk with pivot turns Moderate:Turns with feet close SLOW (>4 steps) with good balance.    Step over obstacles Moderate: Steps over box but touches box OR displays cautious behavior by slowing gait.    Timed UP & GO with Dual Task Moderate: Dual Task affects either counting OR walking (>10%) when compared to the TUG without Dual Task.    Mini-BEST total score 17             PATIENT EDUCATION: Education details: Goal outcomes continued, continue HEP Person educated: Patient Education method: Medical illustrator Education comprehension: verbalized understanding  HOME EXERCISE PROGRAM: Access Code: [email or web address] URL: [email or web address] Date: 09/14/2022 Prepared by: Alethia Berthold Plaster  Exercises - Feet together with eyes closed and head turns   - 1 x daily - 7 x weekly - 3-4 reps - 20-30 second hold - Tandem Stance in Corner  - 1 x daily - 7 x weekly - 3-4 reps - 15-30 second  hold  GOALS: Goals reviewed with patient? Yes  SHORT TERM GOALS: Target date: 09/28/2022   Pt will be  independent with initial HEP for improved strength, balance, transfers and gait.  Baseline: Goal status: MET  2.  Pt will improve gait velocity to at least 1.5 ft/s w/LRAD for improved gait efficiency and performance at limited community ambulator level   Baseline: 1.27 ft/s without AD; 2.6 ft/s without AD (3/27) Goal status: MET  3.  Berg to be assessed and LTG written  Baseline: 49/56 on 09/12/22 Goal status: MET  4.  5x STS to be assessed and LTG written  Baseline: 16.62s w/hands braced on knees  Goal status: MET  5.  Stairs to be assessed and LTG updated  Baseline:  Goal status: MET   LONG TERM GOALS: Target date: 12/06/2022 (updated with certification period)   Pt will be independent with final HEP for improved strength, balance, transfers and gait.  Baseline:  Goal status: IN PROGRESS  2.  Pt will improve 5 x STS to less than or equal to 13 seconds without UE support to demonstrate improved functional strength and transfer efficiency.   Baseline: 16.62s w/hands braced on knees; 16.15s w/o UE support (4/11) Goal status: IN PROGRESS  3.  Pt will improve gait velocity to  at least 2.8 ft/s w/LRAD for improved gait efficiency and reduced recurrent fall risk   Baseline: 1.27 ft/s without AD; 2.6 ft/s without AD (3/27); 2.74 ft/s no AD (4/11) Goal status: IN PROGRESS  4.  Pt will improve Berg score to 52/56 for decreased fall risk  Baseline: 49/56; improved to 53/56 Goal status: MET  5.  Pt will improve FOTO to 61 for improved subjective functional ability   Baseline: 47; 60 (3/27); improved to 72 on 10/22/2022  Goal Status: MET  6.  Patient will improve Mini-BEST score to 21/28 or greater to indicate a decreased risk of falls and improved static and dynamic stability to help increase patient's confidence in community/home environment.   Baseline: 17/28  Goal Status: Initial  ASSESSMENT:  CLINICAL IMPRESSION: Session emphasized continued assessment of balance  function as patient reports this is patient's primary functional complaint. Patient has reached ceiling effect at [place] so finished session by assessing Mini-BEST. Patient demonstrates notable poor reactive balance greatest with posterior balance requiring minA from therapist to prevent fall. Scores < 17.5 indicate increased risk for fall in chronic stroke patient's. Patient will benefit from skilled physical therapy services in order to continue to decrease falls risk moving forward. Continue POC.   OBJECTIVE IMPAIRMENTS: Abnormal gait, decreased activity tolerance, decreased balance, decreased coordination, decreased endurance, difficulty walking, decreased strength, impaired sensation, and pain  ACTIVITY LIMITATIONS: carrying, lifting, bending, squatting, stairs, transfers, bed mobility, self feeding, hygiene/grooming, locomotion level, and caring for others  PARTICIPATION LIMITATIONS: meal prep, cleaning, medication management, interpersonal relationship, community activity, and yard work  PERSONAL FACTORS: Age, Fitness, Past/current experiences, and 1-2 comorbidities: HTN and CVA  are also affecting patient's functional outcome.   REHAB POTENTIAL: Good  CLINICAL DECISION MAKING: Stable/uncomplicated  EVALUATION COMPLEXITY: Low  PLAN:  PT FREQUENCY: 1-2x/week  PT DURATION: 6 weeks  PLANNED INTERVENTIONS: Therapeutic exercises, Therapeutic activity, Neuromuscular re-education, Balance training, Gait training, Patient/Family education, Self Care, Joint mobilization, Stair training, DME instructions, Dry Needling, Manual therapy, and Re-evaluation  PLAN FOR NEXT SESSION: (note: sent new recert on 10/22/2022 with updated goal with miniBEST) add components of mini BEST to plan, check BP, work on functional dynamic movements Add dynamic balance w/EC to HEP (standing marches w/EC)   Carmelia Bake, PT, DPT 10/22/2022, 2:35 PM

## 2022-10-23 ENCOUNTER — Encounter: Payer: Self-pay | Admitting: Occupational Therapy

## 2022-10-23 ENCOUNTER — Encounter: Payer: Medicare Other | Admitting: Occupational Therapy

## 2022-10-25 ENCOUNTER — Ambulatory Visit: Payer: Medicare Other | Admitting: Occupational Therapy

## 2022-10-25 ENCOUNTER — Ambulatory Visit: Payer: Medicare Other | Admitting: Physical Therapy

## 2022-10-25 ENCOUNTER — Encounter: Payer: Self-pay | Admitting: Occupational Therapy

## 2022-10-25 VITALS — BP 143/74

## 2022-10-25 DIAGNOSIS — R278 Other lack of coordination: Secondary | ICD-10-CM

## 2022-10-25 DIAGNOSIS — M6281 Muscle weakness (generalized): Secondary | ICD-10-CM | POA: Diagnosis not present

## 2022-10-25 DIAGNOSIS — R208 Other disturbances of skin sensation: Secondary | ICD-10-CM

## 2022-10-25 DIAGNOSIS — R2689 Other abnormalities of gait and mobility: Secondary | ICD-10-CM

## 2022-10-25 DIAGNOSIS — I69351 Hemiplegia and hemiparesis following cerebral infarction affecting right dominant side: Secondary | ICD-10-CM

## 2022-10-25 DIAGNOSIS — R2681 Unsteadiness on feet: Secondary | ICD-10-CM

## 2022-10-25 NOTE — Patient Instructions (Addendum)
Cranial Flexion: Overhead Arm Extension - Supine ([facility] [place] [employer])    Lie with knees bent, Hold paper towel roll. Raise slowly up above head keeping elbows straight. Repeat _10___ times per set. Do _2___ sets per day.  Scapular Retraction (Prone)    Lie with arms at sides. Pinch shoulder blades together and towards bottom. Shoulders should come up towards ceiling, NOT ears Repeat __10__ times per set. Do ___2_ sessions per day.  On Elbows (Prone)    Rise up on elbows as high as possible and lift chest, keeping hips on floor. Hold _5___ seconds. Breathe Repeat __5-10__ times per set.  Do __2__ sessions per day.     Extension - Prone ([place])    Lie with right arm hanging off side of bed. Lift hand back and up. Keep elbow straight, and thumb facing floor (do NOT turn in towards hip) Repeat __10__ times per set. Do __2__ sessions per day. No wt currently.

## 2022-10-25 NOTE — Therapy (Signed)
OUTPATIENT OCCUPATIONAL THERAPY NEURO TREATMENT   Patient Name: Jerome Cardenas MRN: [id number] DOB:Jun 27, 1951, 72 y.o., male Today's Date: 10/25/2022  PCP: Dr. Michelle Nasuti in [place]  REFERRING PROVIDER: Butch Penny, NP  END OF SESSION:  OT End of Session - 10/25/22 1106     Visit Number 4    Number of Visits 21    Date for OT Re-Evaluation 12/20/22   Currently scheduled until the end of April with OT   Authorization Type Medicare    Progress Note Due on Visit 10    OT Start Time 1100    OT Stop Time 1145    OT Time Calculation (min) 45 min    Activity Tolerance Patient tolerated treatment well    Behavior During Therapy [facility] for tasks assessed/performed             OT End of Session - 10/25/22 1106     Visit Number 4    Number of Visits 21    Date for OT Re-Evaluation 12/20/22   Currently scheduled until the end of April with OT   Authorization Type Medicare    Progress Note Due on Visit 10    OT Start Time 1100    OT Stop Time 1145    OT Time Calculation (min) 45 min    Activity Tolerance Patient tolerated treatment well    Behavior During Therapy [facility] for tasks assessed/performed            Past Medical History:  Diagnosis Date   Cancer    prostate   CKD (chronic kidney disease) stage 2, GFR 60-89 ml/min 07/28/2022   HLD (hyperlipidemia)    Hypertension    Past Surgical History:  Procedure Laterality Date   APPENDECTOMY     CHOLECYSTECTOMY     HERNIA REPAIR     PROSTATE SURGERY     prostate cancer 2014   Patient Active Problem List   Diagnosis Date Noted   Diabetes mellitus type 2 with neurological manifestations 07/29/2022   Hypertension associated with diabetes 07/29/2022   Hyperlipidemia associated with type 2 diabetes mellitus 07/29/2022   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    ONSET DATE: 07/26/2022  REFERRING DIAG: B76.283 (ICD-10-CM) - Hemiplegia and hemiparesis following cerebral infarction affecting right dominant side   THERAPY  DIAG:  Hemiplegia and hemiparesis following cerebral infarction affecting right dominant side  Other disturbances of skin sensation  Muscle weakness (generalized)  Other lack of coordination  Rationale for Evaluation and Treatment: Rehabilitation  SUBJECTIVE:   SUBJECTIVE STATEMENT: Pt reports numbness entire RUE when he wakes up. NO pain however, and no reports of falls  Pt accompanied by: self  PERTINENT HISTORY: "Jerome Cardenas is a 72 y.o. male presenting with left thalamic stroke outside of TPA window. High risk due to current malignancy and HLD. PMH significant for CKD stage II, HTN, GERD, vitamin D deficiency, hyperlipidemia, BPH, metastatic prostate cancer s/p radiation therapy.    Patient reported symptoms of right arm and leg weakness with sensation changes starting 1/18.  Presented to the hospital 1/20 after he developed right facial tingling that morning.  Patient was outside of tPA window.  CT head showed only chronic small vessel ischemia, no bleeding.  MRI head with acute left thalamic capsular infarct.  CTA demonstrates short segment occlusion of left PCA P3 segment and chronic ischemic microangiopathy with no large vessel occlusion.  Echo with LVEF 60-65%. No RWA. Mild LVH. G1DD.    Neurology consulted, recommended routine at risk stratification.  Was found to have A1c of 6.6 and lipids significant for triglycerides 371, HDL 23, and VLDL 74.  TSH, CBC, and BMP unremarkable aside from elevated glucose to 200."  evaluation for RUE weakness and difficulty performing ADLs   PRECAUTIONS: Fall  WEIGHT BEARING RESTRICTIONS: No  PAIN:  Are you having pain? No  FALLS: Has patient fallen in last 6 months? No  LIVING ENVIRONMENT: Lives with: lives with their spouse Lives in: House/apartment Stairs: Yes: Internal: 7 +7 steps; on left going up Has following equipment at home: Quad cane small base  PLOF: Independent; driving; Office manager, worked in [employer] manager  PATIENT GOALS:  Improve writing; get into his truck easier like before  OBJECTIVE:   HAND DOMINANCE: Right  ADLs: Overall ADLs: mod I IADLs: Community mobility: driving currently Handwriting: 25% legible; writing gets sloppier with time  MOBILITY STATUS: Independent  ACTIVITY TOLERANCE: Activity tolerance: fair  FUNCTIONAL OUTCOME MEASURES: FOTO: Slight: 67  UPPER EXTREMITY ROM:     AROM Right (eval) Left (eval)  Shoulder flexion WNL WNL  Shoulder abduction WNL WNL  Elbow flexion WNL WNL  Elbow extension WNL WNL  Wrist flexion WNL WNL  Wrist extension WNL WNL  Wrist pronation WNL WNL  Wrist supination WNL WNL   Digit Composite Flexion WNL WNL  Digit Composite Extension WNL WNL  Digit Opposition WNL WNL  (Blank rows = not tested)  UPPER EXTREMITY MMT:     MMT Right (eval) Left (eval)  Shoulder flexion [facility] WNL  Shoulder abduction WFL WNL  Elbow flexion WFL WNL  Elbow extension WFL WNL  (Blank rows = not tested)  HAND FUNCTION: Grip strength: Right: 77.1 lbs; Left: 77.6 lbs  COORDINATION: 9 Hole Peg test: Right: 37 sec; Left: 24 sec  SENSATION: Reports paresthesias in RUE from shoulder distally  EDEMA: Reports mild to moderate swelling especially at night; no observed edema at eval.   MUSCLE TONE: WFL  COGNITION: Overall cognitive status: Within functional limits for tasks assessed  VISION: Subjective report: He went to the optometrist last month; reports no change; no double vision Baseline vision: Wears glasses all the time and Progressive lenses Visual history:  n/a  OBSERVATIONS: Pt ambulates without LOB. Appears well-kept. Glasses donned.    TODAY'S TREATMENT:                                                                                                                               Discussed positioning at night in bed and in recliner d/t reports of numbness entire RUE. Pt issued bed positioning handout and discussed. Recommended avoiding sleeping on Rt  side. Also recommended cervical pillow/travel pillow for when he falls asleep in recliner  Pt issued scapula stabilization HEP in prone for neuro re-education and sh girdle strengthening, as well as BUE sh flexion ex in supine w/ tactile and verbal cues for proper positioning. See pt instructions for details  BUE AA/ROM in high level sh flexion along  wall using ball w/ cues to prevent compensations at pelvis and Rt shoulder.   PATIENT EDUCATION: Education details: Bed positioning handout, scapula stabilization/strengthening HEP  Person educated: Patient Education method: Explanation, Demonstration, Tactile cues, Verbal cues, and Handouts Education comprehension: verbalized understanding, returned demonstration, verbal cues required, tactile cues required, and needs further education  HOME EXERCISE PROGRAM: 10/11/2022: handwriting practice 10/18/22: coordination and putty HEP 10/25/22: bed positioning, scapula HEP, Sh flex HEP    GOALS:  SHORT TERM GOALS: Target date: 11/08/2022    Patient will demonstrate independence with initial RUE HEP. Baseline: Goal status: IN PROGRESS  2.  Pt will report no difficulty with combing or brushing hair. Baseline: a little difficulty Goal status: INITIAL  3.  Pt will report ability to place dish in overhead cabinet with no difficulty Baseline: a little difficulty Goal status: INITIAL   LONG TERM GOALS: Target date: 12/20/2022    Patient will demonstrate updated RUE HEP with 25% verbal cues or less for proper execution. Baseline:  Goal status: INITIAL  2.  Patient will complete nine-hole peg with use of R in 30 seconds or less. Baseline: 37 sec Goal status: INITIAL  3.  Pt will report handwriting is at least 75% of prior level. Baseline: 25% Goal status: INITIAL  4.  Pt will complete d/c FOTO Baseline:  Goal status: INITIAL  ASSESSMENT:  CLINICAL IMPRESSION: Pt demonstrates good understanding of HEP as needed to progress towards  goals. Remains good candidate for skilled OT services to maximize independence and safety with ADLs and IADLs.   PERFORMANCE DEFICITS: in functional skills including ADLs, IADLs, coordination, sensation, edema, strength, pain, Fine motor control, and UE functional use.   IMPAIRMENTS: are limiting patient from ADLs, IADLs, rest and sleep, leisure, and social participation.   CO-MORBIDITIES: may have co-morbidities  that affects occupational performance. Patient will benefit from skilled OT to address above impairments and improve overall function.  REHAB POTENTIAL: Excellent  PLAN:  OT FREQUENCY: 2x/week  OT DURATION: up to 10 weeks as needed   PLANNED INTERVENTIONS: self care/ADL training, therapeutic exercise, therapeutic activity, neuromuscular re-education, ultrasound, paraffin, fluidotherapy, moist heat, contrast bath, patient/family education, visual/perceptual remediation/compensation, energy conservation, DME and/or AE instructions, and Re-evaluation  RECOMMENDED OTHER SERVICES: None at this time  CONSULTED AND AGREED WITH PLAN OF CARE: Patient  PLAN FOR NEXT SESSION: review coordination and putty HEP, review HEP prone and sh flex supine, progress towards STG's  Sheran Lawless, OT 10/25/2022, 11:07 AM

## 2022-10-25 NOTE — Therapy (Signed)
OUTPATIENT PHYSICAL THERAPY NEURO TREATMENT    Patient Name: Jerome Cardenas MRN: [id number] DOB:Oct 12, 1950, 72 y.o., male Today's Date: 10/25/2022   PCP: Dr. Michelle Nasuti in [place]  REFERRING PROVIDER: Butch Penny, NP    END OF SESSION:  PT End of Session - 10/25/22 1023     Visit Number 15    Number of Visits 27    Date for PT Re-Evaluation 12/06/22    Authorization Type Medicare    Progress Note Due on Visit 10    PT Start Time 1020   Therapist running late   PT Stop Time 1101    PT Time Calculation (min) 41 min    Equipment Utilized During Treatment Gait belt    Activity Tolerance Patient tolerated treatment well    Behavior During Therapy WFL for tasks assessed/performed                Past Medical History:  Diagnosis Date   Cancer    prostate   CKD (chronic kidney disease) stage 2, GFR 60-89 ml/min 07/28/2022   HLD (hyperlipidemia)    Hypertension    Past Surgical History:  Procedure Laterality Date   APPENDECTOMY     CHOLECYSTECTOMY     HERNIA REPAIR     PROSTATE SURGERY     prostate cancer 2014   Patient Active Problem List   Diagnosis Date Noted   Diabetes mellitus type 2 with neurological manifestations 07/29/2022   Hypertension associated with diabetes 07/29/2022   Hyperlipidemia associated with type 2 diabetes mellitus 07/29/2022   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    ONSET DATE: 08/29/2022 (referral)  REFERRING DIAG: I69.398,R26.9 (ICD-10-CM) - Abnormality of gait as late effect of stroke  THERAPY DIAG:  Other abnormalities of gait and mobility  Unsteadiness on feet  Rationale for Evaluation and Treatment: Rehabilitation  SUBJECTIVE:                                                                                                                                                                                             SUBJECTIVE STATEMENT: Pt reports doing well. Took his last baclofen last night, does not feel as though it has  been helpful. Plans on reaching out to [facility] soon. No falls.   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 Yes: NPRS scale: 2/10 Pain location: all over on my right side Pain description: achy Aggravating factors: exercise Relieving factors: massage  VITALS Vitals:   10/25/22 1032  BP: (!) 143/74  PRECAUTIONS: Fall  WEIGHT BEARING RESTRICTIONS: No  FALLS: Has patient fallen in last 6 months? No  LIVING ENVIRONMENT: Lives with: lives with their spouse Lives in: House/apartment Stairs: Yes: Internal: 7 +7 steps; on left going up Has following equipment at home: Quad cane small base  PLOF: Independent  PATIENT GOALS: "mainly getting the movement back in my hand like it was"  OBJECTIVE:   DIAGNOSTIC FINDINGS: MRI of brain on 07/28/22 IMPRESSION: Acute left thalamocapsular infarct.  CT of head/neck on 07/29/22 IMPRESSION: 1. Short segment occlusion of the left PCA P3 segment. 2. No proximal large vessel occlusion. 3. Chronic ischemic microangiopathy.  COGNITION: Overall cognitive status: Within functional limits for tasks assessed   SENSATION: Pt reports constant numbness/tingling in R hemibody   COORDINATION: Heel to shin test: WFL on LLE, dysmetric on RLE   EDEMA: Pt reports new edema in distal RLE and RUE   POSTURE: rounded shoulders, forward head, posterior pelvic tilt, and weight shift left  LOWER EXTREMITY MMT:  Tested in Seated position   MMT Right Eval Left Eval  Hip flexion 3+ 4  Hip extension    Hip abduction 4 4  Hip adduction 3+ 4-  Hip internal rotation    Hip external rotation    Knee flexion 2 4  Knee extension 3+ 4  Ankle dorsiflexion 4+ 4+  Ankle plantarflexion    Ankle inversion    Ankle eversion    (Blank rows = not tested)   TODAY'S TREATMENT:      NMR With two therapists, walking w/random posterolateral perturbations using blue theraband x115' for improved reactive balance strategies. Pt able to elicit good stepping strategies in both L and R directions w/o issues and reported this was easy for him. SBA for safety  Progressed to retro gait w/random posterior perturbation into immediate anterior step for improved posterior reactive balance and set-switching, x15 reps. Pt able to switch from retro > anterior stepping quickly without imbalance. Pt also reported this was easy. SBA for safety.  On floor mat, performed tall > half kneel x3 per side without UE support for improved lateral weight shifting, single leg stability and core stability. Pt performed well but had increased difficulty when on R knee  In front of rebounder on floor mat in tall kneel, ball tosses to rebounder w/1kg ball while therapist performing random posterolateral perturbations at pelvis w/blue theraband for improved core stability and reactive balance strategies. Pt performed well when on L knee, but therapist able to pull pt over to L side when on R knee and pt overall much more challenged on R knee. Pt reports this activity was hard for him and was very fatigued following it, requiring min A to perform half-kneel >stand at end of session    PATIENT EDUCATION: Education details: Purpose of reactive balance, continue HEP  Person educated: Patient Education method: Explanation and Demonstration Education comprehension: verbalized understanding  HOME EXERCISE PROGRAM: Access Code: [email or web address] URL: [email or web address] Date: 09/14/2022 Prepared by: Alethia Berthold Renea Schoonmaker  Exercises - Feet together with eyes closed and head turns   - 1 x daily - 7 x weekly - 3-4 reps - 20-30 second hold - Tandem Stance in Corner  - 1 x daily - 7 x weekly - 3-4 reps - 15-30 second  hold  GOALS: Goals reviewed with patient? Yes   NEW LONG TERM GOALS FOR UPDATED POC: Target date:  11/19/2022   Pt will be independent with final HEP for improved strength, balance, transfers and gait.  Baseline:  Goal status: IN PROGRESS  2.  Pt will improve 5 x STS to less than or equal to 13 seconds without UE support to demonstrate improved functional strength and transfer efficiency.   Baseline: 16.62s w/hands braced on knees; 16.15s w/o UE support (4/11) Goal status: IN PROGRESS  3.  Pt will improve gait velocity to at least 2.8 ft/s w/LRAD for improved gait efficiency and reduced recurrent fall risk   Baseline: 1.27 ft/s without AD; 2.6 ft/s without AD (3/27); 2.74 ft/s no AD (4/11) Goal status: IN PROGRESS  4.  Pt will improve Berg score to 52/56 for decreased fall risk  Baseline: 49/56; improved to 53/56 Goal status: MET  5.  Pt will improve FOTO to 61 for improved subjective functional ability   Baseline: 47; 60 (3/27); improved to 72 on 10/22/2022  Goal Status: MET  6.  Patient will improve Mini-BEST score to 21/28 or greater to indicate a decreased risk of falls and improved static and dynamic stability to help increase patient's confidence in community/home environment.   Baseline: 17/28  Goal Status: Initial  ASSESSMENT:  CLINICAL IMPRESSION: Emphasis of skilled PT session on eliciting reactive balance strategies and core stability. Pt not challenged by simple gait w/random posterolateral perturbations and elicited stepping strategies well. Pt most challenged by half kneel position w/R knee down due to decreased weight acceptance on RLE. Pt will benefit from continued high level dynamic balance challenges. Continue POC.   OBJECTIVE IMPAIRMENTS: Abnormal gait, decreased activity tolerance, decreased balance, decreased coordination, decreased endurance, difficulty walking, decreased strength, impaired sensation, and pain  ACTIVITY LIMITATIONS: carrying, lifting, bending, squatting, stairs, transfers, bed mobility, self feeding, hygiene/grooming, locomotion level,  and caring for others  PARTICIPATION LIMITATIONS: meal prep, cleaning, medication management, interpersonal relationship, community activity, and yard work  PERSONAL FACTORS: Age, Fitness, Past/current experiences, and 1-2 comorbidities: HTN and CVA  are also affecting patient's functional outcome.   REHAB POTENTIAL: Good  CLINICAL DECISION MAKING: Stable/uncomplicated  EVALUATION COMPLEXITY: Low  PLAN:  PT FREQUENCY: 1-2x/week  PT DURATION: 6 weeks  PLANNED INTERVENTIONS: Therapeutic exercises, Therapeutic activity, Neuromuscular re-education, Balance training, Gait training, Patient/Family education, Self Care, Joint mobilization, Stair training, DME instructions, Dry Needling, Manual therapy, and Re-evaluation  PLAN FOR NEXT SESSION: add components of mini BEST to plan, check BP, work on functional dynamic movements Add dynamic balance w/EC to HEP (standing marches w/EC), plyometrics    Stevon Gough E Alaysiah Browder, PT, DPT 10/25/2022, 11:02 AM

## 2022-10-26 ENCOUNTER — Telehealth: Payer: Self-pay | Admitting: Adult Health

## 2022-10-26 NOTE — Telephone Encounter (Signed)
Pt is asking for a call to discuss a stronger dose of the  Baclofen 5 MG TABS

## 2022-10-29 ENCOUNTER — Encounter: Payer: Self-pay | Admitting: Physical Therapy

## 2022-10-29 ENCOUNTER — Ambulatory Visit: Payer: Medicare Other | Admitting: Physical Therapy

## 2022-10-29 ENCOUNTER — Encounter: Payer: Self-pay | Admitting: Occupational Therapy

## 2022-10-29 ENCOUNTER — Ambulatory Visit: Payer: Medicare Other | Admitting: Occupational Therapy

## 2022-10-29 VITALS — BP 152/88 | HR 72

## 2022-10-29 DIAGNOSIS — R208 Other disturbances of skin sensation: Secondary | ICD-10-CM

## 2022-10-29 DIAGNOSIS — M6281 Muscle weakness (generalized): Secondary | ICD-10-CM

## 2022-10-29 DIAGNOSIS — R2689 Other abnormalities of gait and mobility: Secondary | ICD-10-CM

## 2022-10-29 DIAGNOSIS — R278 Other lack of coordination: Secondary | ICD-10-CM

## 2022-10-29 DIAGNOSIS — I69351 Hemiplegia and hemiparesis following cerebral infarction affecting right dominant side: Secondary | ICD-10-CM

## 2022-10-29 DIAGNOSIS — R2681 Unsteadiness on feet: Secondary | ICD-10-CM

## 2022-10-29 NOTE — Therapy (Unsigned)
OUTPATIENT OCCUPATIONAL THERAPY NEURO TREATMENT   Patient Name: Jerome Cardenas MRN: [id number] DOB:12-02-1950, 72 y.o., male Today's Date: 10/29/2022  PCP: Dr. Michelle Nasuti in [place]  REFERRING PROVIDER: Butch Penny, NP  END OF SESSION:  OT End of Session - 10/29/22 1146     Visit Number 5    Number of Visits 21    Date for OT Re-Evaluation 12/20/22   Currently scheduled until the end of April with OT   Authorization Type Medicare    Progress Note Due on Visit 10    OT Start Time 1147    OT Stop Time 1230    OT Time Calculation (min) 43 min    Activity Tolerance Patient tolerated treatment well    Behavior During Therapy [facility] for tasks assessed/performed            Past Medical History:  Diagnosis Date   Cancer    prostate   CKD (chronic kidney disease) stage 2, GFR 60-89 ml/min 07/28/2022   HLD (hyperlipidemia)    Hypertension    Past Surgical History:  Procedure Laterality Date   APPENDECTOMY     CHOLECYSTECTOMY     HERNIA REPAIR     PROSTATE SURGERY     prostate cancer 2014   Patient Active Problem List   Diagnosis Date Noted   Diabetes mellitus type 2 with neurological manifestations 07/29/2022   Hypertension associated with diabetes 07/29/2022   Hyperlipidemia associated with type 2 diabetes mellitus 07/29/2022   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    ONSET DATE: 07/26/2022  REFERRING DIAG: W09.811 (ICD-10-CM) - Hemiplegia and hemiparesis following cerebral infarction affecting right dominant side   THERAPY DIAG:  Other lack of coordination  Muscle weakness (generalized)  Hemiplegia and hemiparesis following cerebral infarction affecting right dominant side  Other disturbances of skin sensation  Rationale for Evaluation and Treatment: Rehabilitation  SUBJECTIVE:   SUBJECTIVE STATEMENT: Pt reports numbness entire RUE when he wakes up. Pain reported in R elbow 5/10 and no reports of falls. *** Reports he is getting a muscle relaxer called in  by his doctor but has not started taking it. He says he has been having spasm  Pt accompanied by: self  PERTINENT HISTORY: "Jerome Cardenas is a 72 y.o. male presenting with left thalamic stroke outside of TPA window. High risk due to current malignancy and HLD. PMH significant for CKD stage II, HTN, GERD, vitamin D deficiency, hyperlipidemia, BPH, metastatic prostate cancer s/p radiation therapy.    Patient reported symptoms of right arm and leg weakness with sensation changes starting 1/18.  Presented to the hospital 1/20 after he developed right facial tingling that morning.  Patient was outside of tPA window.  CT head showed only chronic small vessel ischemia, no bleeding.  MRI head with acute left thalamic capsular infarct.  CTA demonstrates short segment occlusion of left PCA P3 segment and chronic ischemic microangiopathy with no large vessel occlusion.  Echo with LVEF 60-65%. No RWA. Mild LVH. G1DD.    Neurology consulted, recommended routine at risk stratification.  Was found to have A1c of 6.6 and lipids significant for triglycerides 371, HDL 23, and VLDL 74.  TSH, CBC, and BMP unremarkable aside from elevated glucose to 200."  evaluation for RUE weakness and difficulty performing ADLs   PRECAUTIONS: Fall  WEIGHT BEARING RESTRICTIONS: No  PAIN:  Are you having pain? Yes: NPRS scale: 5/10 Pain location: r elbow Pain description: aching Aggravating factors: supinating arm or straightening elbow Relieving factors: holding it at  90 degrees/near body  FALLS: Has patient fallen in last 6 months? No  LIVING ENVIRONMENT: Lives with: lives with their spouse Lives in: House/apartment Stairs: Yes: Internal: 7 +7 steps; on left going up Has following equipment at home: Quad cane small base  PLOF: Independent; driving; Office manager, worked in Office manager  PATIENT GOALS: Improve writing; get into his truck easier like before  OBJECTIVE:   HAND DOMINANCE: Right  ADLs: Overall ADLs: mod  I IADLs: Community mobility: driving currently Handwriting: 25% legible; writing gets sloppier with time  MOBILITY STATUS: Independent  ACTIVITY TOLERANCE: Activity tolerance: fair  FUNCTIONAL OUTCOME MEASURES: FOTO: Slight: 67  UPPER EXTREMITY ROM:     AROM Right (eval) Left (eval)  Shoulder flexion WNL WNL  Shoulder abduction WNL WNL  Elbow flexion WNL WNL  Elbow extension WNL WNL  Wrist flexion WNL WNL  Wrist extension WNL WNL  Wrist pronation WNL WNL  Wrist supination WNL WNL   Digit Composite Flexion WNL WNL  Digit Composite Extension WNL WNL  Digit Opposition WNL WNL  (Blank rows = not tested)  UPPER EXTREMITY MMT:     MMT Right (eval) Left (eval)  Shoulder flexion [facility] WNL  Shoulder abduction WFL WNL  Elbow flexion WFL WNL  Elbow extension WFL WNL  (Blank rows = not tested)  HAND FUNCTION: Grip strength: Right: 77.1 lbs; Left: 77.6 lbs  COORDINATION: 9 Hole Peg test: Right: 37 sec; Left: 24 sec  SENSATION: Reports paresthesias in RUE from shoulder distally  EDEMA: Reports mild to moderate swelling especially at night; no observed edema at eval.   MUSCLE TONE: WFL  COGNITION: Overall cognitive status: Within functional limits for tasks assessed  VISION: Subjective report: He went to the optometrist last month; reports no change; no double vision Baseline vision: Wears glasses all the time and Progressive lenses Visual history:  n/a  OBSERVATIONS: Pt ambulates without LOB. Appears well-kept. Glasses donned.    TODAY'S TREATMENT:                                                                                                                               *** sleep positioning, massage, HEP review  Discussed positioning at night in bed and in recliner d/t reports of numbness entire RUE. Pt issued bed positioning handout and discussed. Recommended avoiding sleeping on Rt side. Also recommended cervical pillow/travel pillow for when he falls  asleep in recliner  Pt issued scapula stabilization HEP in prone for neuro re-education and sh girdle strengthening, as well as BUE sh flexion ex in supine w/ tactile and verbal cues for proper positioning. See pt instructions for details  BUE AA/ROM in high level sh flexion along wall using ball w/ cues to prevent compensations at pelvis and Rt shoulder.   PATIENT EDUCATION: Education details: Bed positioning handout, scapula stabilization/strengthening HEP  Person educated: Patient Education method: Explanation, Demonstration, Tactile cues, Verbal cues, and Handouts Education comprehension: verbalized understanding, returned demonstration, verbal  cues required, tactile cues required, and needs further education  HOME EXERCISE PROGRAM: 10/11/2022: handwriting practice 10/18/22: coordination and putty HEP 10/25/22: bed positioning, scapula HEP, Sh flex HEP    GOALS:  SHORT TERM GOALS: Target date: 11/08/2022    Patient will demonstrate independence with initial RUE HEP. Baseline: Goal status: IN PROGRESS  2.  Pt will report no difficulty with combing or brushing hair. Baseline: a little difficulty Goal status: INITIAL  3.  Pt will report ability to place dish in overhead cabinet with no difficulty Baseline: a little difficulty Goal status: INITIAL   LONG TERM GOALS: Target date: 12/20/2022    Patient will demonstrate updated RUE HEP with 25% verbal cues or less for proper execution. Baseline:  Goal status: INITIAL  2.  Patient will complete nine-hole peg with use of R in 30 seconds or less. Baseline: 37 sec Goal status: INITIAL  3.  Pt will report handwriting is at least 75% of prior level. Baseline: 25% Goal status: INITIAL  4.  Pt will complete d/c FOTO Baseline:  Goal status: INITIAL  ASSESSMENT:  CLINICAL IMPRESSION: Pt demonstrates good understanding of HEP as needed to progress towards goals. Remains good candidate for skilled OT services to maximize  independence and safety with ADLs and IADLs.   PERFORMANCE DEFICITS: in functional skills including ADLs, IADLs, coordination, sensation, edema, strength, pain, Fine motor control, and UE functional use.   IMPAIRMENTS: are limiting patient from ADLs, IADLs, rest and sleep, leisure, and social participation.   CO-MORBIDITIES: may have co-morbidities  that affects occupational performance. Patient will benefit from skilled OT to address above impairments and improve overall function.  REHAB POTENTIAL: Excellent  PLAN:  OT FREQUENCY: 2x/week  OT DURATION: up to 10 weeks as needed   PLANNED INTERVENTIONS: self care/ADL training, therapeutic exercise, therapeutic activity, neuromuscular re-education, ultrasound, paraffin, fluidotherapy, moist heat, contrast bath, patient/family education, visual/perceptual remediation/compensation, energy conservation, DME and/or AE instructions, and Re-evaluation  RECOMMENDED OTHER SERVICES: None at this time  CONSULTED AND AGREED WITH PLAN OF CARE: Patient  PLAN FOR NEXT SESSION: review coordination and putty HEP, review HEP prone and sh flex supine, progress towards STG's   Delana Meyer, OT 10/29/2022, 5:41 PM

## 2022-10-29 NOTE — Therapy (Signed)
OUTPATIENT PHYSICAL THERAPY NEURO TREATMENT    Patient Name: Jerome Cardenas MRN: [id number] DOB:April 30, 1951, 72 y.o., male Today's Date: 10/29/2022  PCP: Dr. Michelle Nasuti in [place]  REFERRING PROVIDER: Butch Penny, NP   END OF SESSION:  PT End of Session - 10/29/22 1232     Visit Number 16    Number of Visits 27    Date for PT Re-Evaluation 12/06/22    Authorization Type Medicare    Progress Note Due on Visit 10    PT Start Time 1231    PT Stop Time 1315    PT Time Calculation (min) 44 min    Equipment Utilized During Treatment Gait belt    Activity Tolerance Patient tolerated treatment well    Behavior During Therapy WFL for tasks assessed/performed             Past Medical History:  Diagnosis Date   Cancer    prostate   CKD (chronic kidney disease) stage 2, GFR 60-89 ml/min 07/28/2022   HLD (hyperlipidemia)    Hypertension    Past Surgical History:  Procedure Laterality Date   APPENDECTOMY     CHOLECYSTECTOMY     HERNIA REPAIR     PROSTATE SURGERY     prostate cancer 2014   Patient Active Problem List   Diagnosis Date Noted   Diabetes mellitus type 2 with neurological manifestations 07/29/2022   Hypertension associated with diabetes 07/29/2022   Hyperlipidemia associated with type 2 diabetes mellitus 07/29/2022   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    ONSET DATE: 08/29/2022 (referral)  REFERRING DIAG: I69.398,R26.9 (ICD-10-CM) - Abnormality of gait as late effect of stroke  THERAPY DIAG:  Other abnormalities of gait and mobility  Unsteadiness on feet  Rationale for Evaluation and Treatment: Rehabilitation  SUBJECTIVE:                                                                                                                                                                                             SUBJECTIVE STATEMENT: Pt reports doing alright; he had a bad night of sleep last night due to numbness on right lower and upper extremity..  Denies falls/near falls.   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 Yes: NPRS scale: 5/10 Pain location: right elbow Pain description: achy Aggravating factors: straightening arm Relieving factors: bending arm  VITALS Vitals:   10/29/22 1234  BP: (!) 152/88  Pulse: 72    PRECAUTIONS: Fall  WEIGHT BEARING RESTRICTIONS: No  FALLS:  Has patient fallen in last 6 months? No  LIVING ENVIRONMENT: Lives with: lives with their spouse Lives in: House/apartment Stairs: Yes: Internal: 7 +7 steps; on left going up Has following equipment at home: Quad cane small base  PLOF: Independent  PATIENT GOALS: "mainly getting the movement back in my hand like it was"  OBJECTIVE:   DIAGNOSTIC FINDINGS: MRI of brain on 07/28/22 IMPRESSION: Acute left thalamocapsular infarct.  CT of head/neck on 07/29/22 IMPRESSION: 1. Short segment occlusion of the left PCA P3 segment. 2. No proximal large vessel occlusion. 3. Chronic ischemic microangiopathy.  COGNITION: Overall cognitive status: Within functional limits for tasks assessed   SENSATION: Pt reports constant numbness/tingling in R hemibody   COORDINATION: Heel to shin test: WFL on LLE, dysmetric on RLE   EDEMA: Pt reports new edema in distal RLE and RUE   POSTURE: rounded shoulders, forward head, posterior pelvic tilt, and weight shift left  LOWER EXTREMITY MMT:  Tested in Seated position   MMT Right Eval Left Eval  Hip flexion 3+ 4  Hip extension    Hip abduction 4 4  Hip adduction 3+ 4-  Hip internal rotation    Hip external rotation    Knee flexion 2 4  Knee extension 3+ 4  Ankle dorsiflexion 4+ 4+  Ankle plantarflexion    Ankle inversion    Ankle eversion    (Blank rows = not tested)   TODAY'S TREATMENT:      HR during session 40-110 bpm;  recommend for   NMR Mini hops on red mat for cushion 2 x 10 (SBA) Bunny hops fwd and backward on red mat 3 x 4'  10lb med ball pickup and set down with hop between x 10 Tall kneel alt right versus left LE back on red mat with CGA-minA with D2 med ball slam 10# 2 x 5 2" jump up from floor to red mat x 10 (emphasis on landing with both feet simultaneously) CGA Tall kneel on foam with baloon taps to self 4 x 5-12 taps (CGA-minA) Half kneel on foam with RLE back on foam taps to self 3 x 12 taps (CGA-modA) Due to fatigue at end patient required modA to come to stand with use of chair for UE support, SBA-CGA for floor transfers for remainder of session, intermixed seated breaks as needed RPE at peak when required increased assistance with floor transfer 10/10 for RPE, recovered to 5/10 with 2 minute seated break  PATIENT EDUCATION: Education details: Continue HEP, possible D/C next session Person educated: Patient Education method: Medical illustrator Education comprehension: verbalized understanding  HOME EXERCISE PROGRAM: Access Code: [email or web address] URL: [email or web address] Date: 09/14/2022 Prepared by: Alethia Berthold Plaster  Exercises - Feet together with eyes closed and head turns   - 1 x daily - 7 x weekly - 3-4 reps - 20-30 second hold - Tandem Stance in Corner  - 1 x daily - 7 x weekly - 3-4 reps - 15-30 second  hold  GOALS: Goals reviewed with patient? Yes   NEW LONG TERM GOALS FOR UPDATED POC: Target date: 11/19/2022   Pt will be independent with final HEP for improved strength, balance, transfers and gait.  Baseline:  Goal status: IN PROGRESS  2.  Pt will improve 5 x STS to less than or equal to 13 seconds without UE support to demonstrate improved functional strength and transfer efficiency.   Baseline: 16.62s w/hands braced on knees; 16.15s w/o UE support (4/11) Goal status: IN PROGRESS  3.  Pt will improve gait velocity to at least 2.8 ft/s w/LRAD for  improved gait efficiency and reduced recurrent fall risk   Baseline: 1.27 ft/s without AD; 2.6 ft/s without AD (3/27); 2.74 ft/s no AD (4/11) Goal status: IN PROGRESS  4.  Pt will improve Berg score to 52/56 for decreased fall risk  Baseline: 49/56; improved to 53/56 Goal status: MET  5.  Pt will improve FOTO to 61 for improved subjective functional ability   Baseline: 47; 60 (3/27); improved to 72 on 10/22/2022  Goal Status: MET  6.  Patient will improve Mini-BEST score to 21/28 or greater to indicate a decreased risk of falls and improved static and dynamic stability to help increase patient's confidence in community/home environment.   Baseline: 17/28  Goal Status: Initial  ASSESSMENT:  CLINICAL IMPRESSION: Session emphasized higher level plymometric activities and higher level balance tasks both in standing and tall/half kneel. Patient remains primarily limited by HR response but otherwise tolerates activities extremely well. Requires modA x 1 instance due to fatigue to come back to stand and education on pacing.  Continue POC with possible D/C next session pending patient's progress.   OBJECTIVE IMPAIRMENTS: Abnormal gait, decreased activity tolerance, decreased balance, decreased coordination, decreased endurance, difficulty walking, decreased strength, impaired sensation, and pain  ACTIVITY LIMITATIONS: carrying, lifting, bending, squatting, stairs, transfers, bed mobility, self feeding, hygiene/grooming, locomotion level, and caring for others  PARTICIPATION LIMITATIONS: meal prep, cleaning, medication management, interpersonal relationship, community activity, and yard work  PERSONAL FACTORS: Age, Fitness, Past/current experiences, and 1-2 comorbidities: HTN and CVA  are also affecting patient's functional outcome.   REHAB POTENTIAL: Good  CLINICAL DECISION MAKING: Stable/uncomplicated  EVALUATION COMPLEXITY: Low  PLAN:  PT FREQUENCY: 1-2x/week  PT DURATION: 6  weeks  PLANNED INTERVENTIONS: Therapeutic exercises, Therapeutic activity, Neuromuscular re-education, Balance training, Gait training, Patient/Family education, Self Care, Joint mobilization, Stair training, DME instructions, Dry Needling, Manual therapy, and Re-evaluation  PLAN FOR NEXT SESSION: Plan for possible D/C with updated HEP pending patient's progress    Carmelia Bake, PT, DPT 10/29/2022, 3:15 PM

## 2022-10-30 ENCOUNTER — Telehealth: Payer: Self-pay

## 2022-10-30 MED ORDER — BACLOFEN 10 MG PO TABS: 10.0000 mg | ORAL_TABLET | Freq: Every day | ORAL | 1 refills | Status: DC

## 2022-10-30 NOTE — Telephone Encounter (Signed)
I called spoke to wife of pt.  I relayed that per MM/NP is ok to to increase to baclofen  at bedtime.  She will relay.  I will call into [facility].

## 2022-10-30 NOTE — Telephone Encounter (Signed)
Okay to increase to 10 mg at bedtime

## 2022-10-30 NOTE — Telephone Encounter (Signed)
Return call to patient after request to increase baclofen  dose. Started taking 10/04/22 and he states he has had little relief but not a lot, still having spasms in right arm and leg. His oncologist Foy [place]) suggested him taking 2 tablets and he wanted to check with [place]. He is not having any side effects from the medication that he is aware of. Advised I will send to [facility] for further recommendations. Patient appreciative of call.

## 2022-10-30 NOTE — Addendum Note (Signed)
Addended by: Guy Begin on: 10/30/2022 02:19 PM   Modules accepted: Orders

## 2022-10-30 NOTE — Telephone Encounter (Signed)
Was prescribed 10/04/22 for muscles spasms in right arm and leg.

## 2022-11-01 ENCOUNTER — Ambulatory Visit: Payer: Medicare Other | Admitting: Occupational Therapy

## 2022-11-01 ENCOUNTER — Ambulatory Visit: Payer: Medicare Other | Admitting: Physical Therapy

## 2022-11-01 DIAGNOSIS — M6281 Muscle weakness (generalized): Secondary | ICD-10-CM | POA: Diagnosis not present

## 2022-11-01 DIAGNOSIS — R2681 Unsteadiness on feet: Secondary | ICD-10-CM

## 2022-11-01 DIAGNOSIS — R2689 Other abnormalities of gait and mobility: Secondary | ICD-10-CM

## 2022-11-01 DIAGNOSIS — I69351 Hemiplegia and hemiparesis following cerebral infarction affecting right dominant side: Secondary | ICD-10-CM

## 2022-11-01 DIAGNOSIS — R278 Other lack of coordination: Secondary | ICD-10-CM

## 2022-11-01 NOTE — Therapy (Signed)
OUTPATIENT PHYSICAL THERAPY NEURO TREATMENT    Patient Name: Jerome Cardenas MRN: [id number] DOB:1951-01-23, 72 y.o., male Today's Date: 11/01/2022  PCP: Dr. Michelle Nasuti in [place]  REFERRING PROVIDER: Butch Penny, NP   END OF SESSION:  PT End of Session - 11/01/22 0939     Visit Number 17    Number of Visits 27    Date for PT Re-Evaluation 12/06/22    Authorization Type Medicare    Progress Note Due on Visit 10    PT Start Time [phone]   Therapist running late   PT Stop Time 1018    PT Time Calculation (min) 41 min    Equipment Utilized During Treatment Gait belt    Activity Tolerance Patient tolerated treatment well    Behavior During Therapy [facility] for tasks assessed/performed             Past Medical History:  Diagnosis Date   Cancer    prostate   CKD (chronic kidney disease) stage 2, GFR 60-89 ml/min 07/28/2022   HLD (hyperlipidemia)    Hypertension    Past Surgical History:  Procedure Laterality Date   APPENDECTOMY     CHOLECYSTECTOMY     HERNIA REPAIR     PROSTATE SURGERY     prostate cancer 2014   Patient Active Problem List   Diagnosis Date Noted   Diabetes mellitus type 2 with neurological manifestations 07/29/2022   Hypertension associated with diabetes 07/29/2022   Hyperlipidemia associated with type 2 diabetes mellitus 07/29/2022   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 Did not sleep well last night, ran out of  Baclofen and needs to pick up prescription today. Does not feel ready for DC.   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 No  VITALS There were no vitals filed for this visit.   PRECAUTIONS: Fall  WEIGHT BEARING RESTRICTIONS: No  FALLS: Has patient fallen in last 6 months? No  LIVING ENVIRONMENT: Lives  with: lives with their spouse Lives in: House/apartment Stairs: Yes: Internal: 7 +7 steps; on left going up Has following equipment at home: Quad cane small base  PLOF: Independent  PATIENT GOALS: "mainly getting the movement back in my hand like it was"  OBJECTIVE:   DIAGNOSTIC FINDINGS: MRI of brain on 07/28/22 IMPRESSION: Acute left thalamocapsular infarct.  CT of head/neck on 07/29/22 IMPRESSION: 1. Short segment occlusion of the left PCA P3 segment. 2. No proximal large vessel occlusion. 3. Chronic ischemic microangiopathy.  COGNITION: Overall cognitive status: Within functional limits for tasks assessed   SENSATION: Pt reports constant numbness/tingling in R hemibody   COORDINATION: Heel to shin test: WFL on LLE, dysmetric on RLE   EDEMA: Pt reports new edema in distal RLE and RUE   POSTURE: rounded shoulders, forward head, posterior pelvic tilt, and weight shift left  LOWER EXTREMITY MMT:  Tested in Seated position   MMT Right Eval Left Eval  Hip flexion 3+ 4  Hip extension    Hip abduction 4 4  Hip adduction 3+ 4-  Hip internal rotation    Hip external rotation    Knee flexion 2 4  Knee extension 3+ 4  Ankle dorsiflexion 4+ 4+  Ankle plantarflexion    Ankle inversion    Ankle eversion    (Blank rows = not tested)   TODAY'S TREATMENT:     Ther Act  Entirety of session spent discussing POC and plan moving forward. Pt reports he attempted to do some management of his truck  yesterday (changing windshield wipers, etc.) and was unable to do so due to "balance". Pt did not fall or lose balance, but feels as though he is going to. RUE is still extremely tight and painful, so educated pt on dry needling and pt in agreement to try. Pt reports he did get massage on R shoulder Monday and slept well Monday night. Pt in agreement to continue PT at 2x/week for a few weeks and then taper to 1x/week prior to DC to work on transition out of PT. Pt reports he is still having difficulty ambulating from bedroom to bathroom at night and had quite a few stumbles last night, no falls. Pt would like to feel more stable at night    PATIENT EDUCATION: Education details: See above  Person educated: Patient Education method: Medical illustrator Education comprehension: verbalized understanding  HOME EXERCISE PROGRAM: Access Code: [email or web address] URL: [email or web address] Date: 09/14/2022 Prepared by: Alethia Berthold Dajuana Palen  Exercises - Feet together with eyes closed and head turns   - 1 x daily - 7 x weekly - 3-4 reps - 20-30 second hold - Tandem Stance in Corner  - 1 x daily - 7 x weekly - 3-4 reps - 15-30 second  hold  GOALS: Goals reviewed with patient? Yes   NEW LONG TERM GOALS FOR UPDATED POC: Target date: 11/19/2022   Pt will be independent with final HEP for improved strength, balance, transfers and gait.  Baseline:  Goal status: IN PROGRESS  2.  Pt will improve 5 x STS to less than or equal to 13 seconds without UE support to demonstrate improved functional strength and transfer efficiency.   Baseline: 16.62s w/hands braced on knees; 16.15s w/o UE support (4/11) Goal status: IN PROGRESS  3.  Pt will improve gait velocity to at least 2.8 ft/s w/LRAD for improved gait efficiency and reduced recurrent fall risk   Baseline: 1.27 ft/s without AD; 2.6 ft/s without AD (3/27); 2.74 ft/s no AD (  4/11) Goal status: IN PROGRESS  4.  Pt will improve Berg score to  52/56 for decreased fall risk  Baseline: 49/56; improved to 53/56 Goal status: MET  5.  Pt will improve FOTO to 61 for improved subjective functional ability   Baseline: 47; 60 (3/27); improved to 72 on 10/22/2022  Goal Status: MET  6.  Patient will improve Mini-BEST score to 21/28 or greater to indicate a decreased risk of falls and improved static and dynamic stability to help increase patient's confidence in community/home environment.   Baseline: 17/28  Goal Status: Initial  ASSESSMENT:  CLINICAL IMPRESSION: Emphasis of skilled PT session on discussing POC and pt's goals for PT. At this time, pt continues to be limited by RUE pain and RLE weakness and would like to continue w/PT due to feeling stronger since starting PT. Pt in agreement to try TPDN next week to RUE and upper traps, as he is very tight in these areas and having pain at night which interferes with his ability to sleep. Pt continues to have instability when ambulating to bathroom during night, so will need to work on balance tasks w/reduced visual input. Continue POC.   OBJECTIVE IMPAIRMENTS: Abnormal gait, decreased activity tolerance, decreased balance, decreased coordination, decreased endurance, difficulty walking, decreased strength, impaired sensation, and pain  ACTIVITY LIMITATIONS: carrying, lifting, bending, squatting, stairs, transfers, bed mobility, self feeding, hygiene/grooming, locomotion level, and caring for others  PARTICIPATION LIMITATIONS: meal prep, cleaning, medication management, interpersonal relationship, community activity, and yard work  PERSONAL FACTORS: Age, Fitness, Past/current experiences, and 1-2 comorbidities: HTN and CVA  are also affecting patient's functional outcome.   REHAB POTENTIAL: Good  CLINICAL DECISION MAKING: Stable/uncomplicated  EVALUATION COMPLEXITY: Low  PLAN:  PT FREQUENCY: 1-2x/week  PT DURATION: 6 weeks  PLANNED INTERVENTIONS: Therapeutic exercises, Therapeutic  activity, Neuromuscular re-education, Balance training, Gait training, Patient/Family education, Self Care, Joint mobilization, Stair training, DME instructions, Dry Needling, Manual therapy, and Re-evaluation  PLAN FOR NEXT SESSION: TPDN to RUE and upper traps, Balance w/reduced vestibular input. RLE strength and single leg stability    Lambros Cerro E Trimaine Maser, PT, DPT 11/01/2022, 10:30 AM

## 2022-11-01 NOTE — Therapy (Signed)
OUTPATIENT OCCUPATIONAL THERAPY NEURO TREATMENT   Patient Name: Jerome Cardenas MRN: [id number] DOB:[birth year], 72 y.o., male Today's Date: 11/01/2022  PCP: Dr. Michelle Cardenas in [place]  REFERRING PROVIDER: Butch Penny, NP  END OF SESSION:  OT End of Session - 11/01/22 1022     Visit Number 6    Number of Visits 21    Date for OT Re-Evaluation 12/20/22    Authorization Type Medicare    Progress Note Due on Visit 10    OT Start Time 1022    OT Stop Time 1100    OT Time Calculation (min) 38 min    Activity Tolerance Patient tolerated treatment well    Behavior During Therapy WFL for tasks assessed/performed            Past Medical History:  Diagnosis Date   Cancer    prostate   CKD (chronic kidney disease) stage 2, GFR 60-89 ml/min 07/28/2022   HLD (hyperlipidemia)    Hypertension    Past Surgical History:  Procedure Laterality Date   APPENDECTOMY     CHOLECYSTECTOMY     HERNIA REPAIR     PROSTATE SURGERY     prostate cancer 2014   Patient Active Problem List   Diagnosis Date Noted   Diabetes mellitus type 2 with neurological manifestations 07/29/2022   Hypertension associated with diabetes 07/29/2022   Hyperlipidemia associated with type 2 diabetes mellitus 07/29/2022   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 Pain reported in R elbow 2/10 at beginning of session but no reports of falls.  His R  arm/leg feel more numb though.  He also reports feeling better after Monday's appointment and sleeping well. Reports he is supposed to pick up his muscle relaxer medication today.  Pt accompanied by: self  PERTINENT HISTORY: "Jerome Cardenas is a 72 y.o. male presenting with left thalamic stroke outside of TPA window. High risk due to current malignancy and HLD. PMH significant for CKD stage II, HTN, GERD, vitamin D deficiency, hyperlipidemia, BPH, metastatic prostate cancer s/p radiation therapy.    Patient reported symptoms of right arm and leg weakness with sensation changes starting 1/18.  Presented to the hospital 1/20 after he developed right facial tingling that morning.  Patient was outside of tPA window.  CT head showed only chronic small vessel ischemia, no bleeding.  MRI head with acute left thalamic capsular infarct.  CTA demonstrates short segment occlusion of left PCA P3 segment and chronic ischemic microangiopathy with no large vessel occlusion.  Echo with LVEF 60-65%. No RWA. Mild LVH. G1DD.    Neurology consulted, recommended routine at risk stratification.  Was found to have A1c of 6.6 and lipids significant for triglycerides 371, HDL 23, and VLDL 74.  TSH, CBC, and BMP unremarkable aside from elevated glucose to 200."  evaluation for RUE weakness and difficulty performing ADLs   PRECAUTIONS: Fall  WEIGHT BEARING RESTRICTIONS: No  PAIN:  Are you having pain? Yes: NPRS scale: 2/10 Pain location: R elbow Pain description: aching and  numbness Aggravating factors: supinating arm or straightening elbow Relieving factors: holding it at 90 degrees/near body  FALLS: Has patient fallen in last 6 months? No  LIVING ENVIRONMENT: Lives with: lives with their spouse Lives in: House/apartment Stairs: Yes: Internal: 7 +7 steps; on left going up Has following equipment at home: Quad cane small base  PLOF: Independent; driving; Office manager, worked in Office manager  PATIENT GOALS: Improve  writing; get into his truck easier like before  OBJECTIVE: (from evaluation unless otherwise noted)  HAND DOMINANCE: Right  ADLs: Overall ADLs: mod I IADLs: Community mobility: driving currently Handwriting: 25% legible; writing gets sloppier with time  MOBILITY STATUS: Independent  ACTIVITY TOLERANCE: Activity tolerance: fair  FUNCTIONAL OUTCOME MEASURES: FOTO: Slight: 67  UPPER EXTREMITY ROM:     AROM Right (eval) Left (eval)  Shoulder flexion WNL WNL  Shoulder abduction WNL WNL  Elbow flexion WNL WNL  Elbow extension WNL WNL  Wrist flexion WNL WNL  Wrist extension WNL WNL  Wrist pronation WNL WNL  Wrist supination WNL WNL   Digit Composite Flexion WNL WNL  Digit Composite Extension WNL WNL  Digit Opposition WNL WNL  (Blank rows = not tested)  UPPER EXTREMITY MMT:     MMT Right (eval) Left (eval)  Shoulder flexion [facility] WNL  Shoulder abduction WFL WNL  Elbow flexion WFL WNL  Elbow extension WFL WNL  (Blank rows = not tested)  HAND FUNCTION: Grip strength: Right: 77.1 lbs; Left: 77.6 lbs  COORDINATION: 9 Hole Peg test: Right: 37 sec; Left: 24 sec  11/01/22 Right hand - 26.93 seconds  SENSATION: Reports paresthesias in RUE from shoulder distally  EDEMA: Reports mild to moderate swelling especially at night; no observed edema at eval.   MUSCLE TONE: WFL  COGNITION: Overall cognitive status: Within functional limits for tasks assessed  VISION: Subjective report: He went to the optometrist last month; reports no change; no double vision Baseline vision: Wears glasses all the time and Progressive lenses Visual history:  n/a  OBSERVATIONS: 11/01/22 - Pt ambulates without LOB with and without cane for short distances in the gym. Glasses donned.    TODAY'S TREATMENT:                                  Updated 9-Hole Peg retested - Goal met (see above) Overhead reaching goals assessed - Goals met - able to remove and replace 5 cups each from bottom and  middle shelf in cabinet managing plastic, handled, glass and tall/short cups without difficulty or discomfort.   Reviewed posture and ROM to improve seated and standing posture with daily activities and provided further massage to B scapula/back (focusing more on R side) due to effectiveness in relief of discomfort and improved sleeping s/p previous session.  Pt guided through scapula stabilization HEP in prone for neuro re-education and sh girdle strengthening via prone and weight bearing through his elbows. Patient continues to have palpable R shoulder girdle tightness and unevenness compared to L side but with some improvement compared to previous session s/p previous massage and self performance of prone activities at home as confirmed by patient.    Further massage and manual techniques provided today during prone weight bearing and stretching including soft tissue mobilization with localized, gentle stretching, stroking, kneading, and some compression to soften tightness, reduce pain and increase muscle length.  Reviewed BUE sh ROM ideas including flexion/ext in standing w/ cane with tactile, visual  and verbal cues by OTR to improve upright posture, improve lumbar lordosis, shoulders back and minimize forward head position. Patient encouraged to minimize compensatory strategies, keep his body and head upright as much as possible while moving arms.    PATIENT EDUCATION: Education details: Reviewed scapula stabilization/strengthening HEP  Person educated: Patient Education method: Explanation, Demonstration, Tactile cues, Verbal cues, and Handouts Education comprehension: verbalized understanding, returned demonstration, verbal cues required, tactile cues required, and needs further education  HOME EXERCISE PROGRAM:  10/18/22: coordination and putty HEP 10/25/22: bed positioning, scapula HEP, Sh flex HEP 10/29/22: Reviewed bed positioning and UE ROM including with use of cane 11/01/22: Reviewed  UE ROM with cane (will need handouts next visit)  GOALS:  SHORT TERM GOALS: Target date: 11/08/2022    Patient will demonstrate independence with initial RUE HEP. Baseline: Goal status: IN PROGRESS  2.  Pt will report no difficulty with combing or brushing hair. Baseline: a little difficulty Goal status: MET  3.  Pt will report ability to place dish in overhead cabinet with no difficulty Baseline: a little difficulty Goal status: MET   LONG TERM GOALS: Target date: 12/20/2022    Patient will demonstrate updated RUE HEP with 25% verbal cues or less for proper execution. Baseline:  Goal status: INITIAL  2.  Patient will complete nine-hole peg with use of R in 30 seconds or less. Baseline: 37 sec Goal status: MET  3.  Pt will report handwriting is at least 75% of prior level. Baseline: 25% Goal status: INITIAL  4.  Pt will complete d/c FOTO Baseline:  Goal status: INITIAL  ASSESSMENT:  CLINICAL IMPRESSION: Pt demonstrated decreased muscular tension in back and scapula region with improvement to no complaints of pain at end of session s/p manual techniques, stretches and AROM activities. Remains good candidate for skilled OT services to maximize independence and safety with ADLs and IADLs.   PERFORMANCE DEFICITS: in functional skills including ADLs, IADLs, coordination, sensation, edema, strength, pain, Fine motor control, and UE functional use.   IMPAIRMENTS: are limiting patient from ADLs, IADLs, rest and sleep, leisure, and social participation.   CO-MORBIDITIES: may have co-morbidities  that affects occupational performance. Patient will benefit from skilled OT to address above impairments and improve overall function.  REHAB POTENTIAL: Excellent  PLAN:  OT FREQUENCY: 2x/week  OT DURATION: up to 10 weeks as needed   PLANNED INTERVENTIONS: self care/ADL training, therapeutic exercise, therapeutic activity, neuromuscular re-education, ultrasound, paraffin,  fluidotherapy, moist heat, contrast bath, patient/family education, visual/perceptual remediation/compensation, energy conservation, DME and/or AE instructions, and Re-evaluation  RECOMMENDED OTHER SERVICES: None at this time  CONSULTED AND AGREED WITH PLAN OF CARE: Patient  PLAN FOR NEXT SESSION: Review handwriting goal, Provide handout for cane exercises, trial self performance of massage techniques to back/scapula with tennis boll   Victorino Sparrow, OT 11/01/2022, 11:27 AM

## 2022-11-05 ENCOUNTER — Ambulatory Visit: Payer: Medicare Other | Admitting: Physical Therapy

## 2022-11-05 VITALS — BP 165/83 | HR 68

## 2022-11-05 DIAGNOSIS — R2689 Other abnormalities of gait and mobility: Secondary | ICD-10-CM

## 2022-11-05 DIAGNOSIS — M6281 Muscle weakness (generalized): Secondary | ICD-10-CM

## 2022-11-05 DIAGNOSIS — R278 Other lack of coordination: Secondary | ICD-10-CM

## 2022-11-05 DIAGNOSIS — R2681 Unsteadiness on feet: Secondary | ICD-10-CM

## 2022-11-05 NOTE — Therapy (Signed)
OUTPATIENT PHYSICAL THERAPY NEURO TREATMENT    Patient Name: Jerome Cardenas MRN: [id number] DOB:1950/11/13, 72 y.o., male Today's Date: 11/05/2022  PCP: Dr. Michelle Nasuti in [place]  REFERRING PROVIDER: Butch Penny, NP   END OF SESSION:  PT End of Session - 11/05/22 1448     Visit Number 18    Number of Visits 27    Date for PT Re-Evaluation 12/06/22    Authorization Type Medicare    Progress Note Due on Visit 10    PT Start Time 1445    PT Stop Time 1528    PT Time Calculation (min) 43 min    Equipment Utilized During Treatment Gait belt    Activity Tolerance Patient tolerated treatment well    Behavior During Therapy WFL for tasks assessed/performed              Past Medical History:  Diagnosis Date   Cancer (HCC)    prostate   CKD (chronic kidney disease) stage 2, GFR 60-89 ml/min 07/28/2022   HLD (hyperlipidemia)    Hypertension    Past Surgical History:  Procedure Laterality Date   APPENDECTOMY     CHOLECYSTECTOMY     HERNIA REPAIR     PROSTATE SURGERY     prostate cancer 2014   Patient Active Problem List   Diagnosis Date Noted   Diabetes mellitus type 2 with neurological manifestations (HCC) 07/29/2022   Hypertension associated with diabetes (HCC) 07/29/2022   Hyperlipidemia associated with type 2 diabetes mellitus (HCC) 07/29/2022   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    ONSET DATE: 08/29/2022 (referral)  REFERRING DIAG: N82.956,O13.0 (ICD-10-CM) - Abnormality of gait as late effect of stroke  THERAPY DIAG:  Unsteadiness on feet  Other abnormalities of gait and mobility  Other lack of coordination  Muscle weakness (generalized)  Rationale for Evaluation and Treatment: Rehabilitation  SUBJECTIVE:                                                                                                                                                                                             SUBJECTIVE STATEMENT: Pt reports that he saw his  PCP earlier today, changed some medications (updated in chart). Pt reports his RUE pain is affecting his LUE as he his not able to sleep on his back or his R side, causing L shoulder pain. No pain at rest, has pain in RUE with mobility and LUE when sleeping on this limb.  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 No  VITALS Vitals:   11/05/22 1455  BP: (!) 165/83  Pulse: 68     PRECAUTIONS: Fall  WEIGHT BEARING RESTRICTIONS: No  FALLS: Has patient fallen in last 6 months? No  LIVING ENVIRONMENT: Lives with: lives with their spouse Lives in: House/apartment Stairs: Yes: Internal: 7 +7 steps; on left going up Has following equipment at home: Quad cane small base  PLOF: Independent  PATIENT GOALS: "mainly getting the movement back in my hand like it was"  OBJECTIVE:   DIAGNOSTIC FINDINGS: MRI of brain on 07/28/22 IMPRESSION: Acute left thalamocapsular infarct.  CT of head/neck on 07/29/22 IMPRESSION: 1. Short segment occlusion of the left PCA P3 segment. 2. No proximal large vessel occlusion. 3. Chronic ischemic microangiopathy.  COGNITION: Overall cognitive status: Within functional limits for tasks assessed   SENSATION: Pt reports constant numbness/tingling in R hemibody   COORDINATION: Heel to shin test: WFL on LLE, dysmetric on RLE   EDEMA: Pt reports new edema in distal RLE and RUE   POSTURE: rounded shoulders, forward head, posterior pelvic tilt, and weight shift left  LOWER EXTREMITY MMT:  Tested in Seated position   MMT Right Eval Left Eval  Hip flexion 3+ 4  Hip extension    Hip abduction 4 4  Hip adduction 3+ 4-  Hip internal rotation    Hip external rotation    Knee flexion 2 4  Knee extension 3+ 4  Ankle dorsiflexion 4+ 4+  Ankle plantarflexion    Ankle inversion    Ankle  eversion    (Blank rows = not tested)   TODAY'S TREATMENT:      TherAct   Trigger Point Dry-Needling  Treatment instructions: Expect mild to moderate muscle soreness. S/S of pneumothorax if dry needled over a lung field, and to seek immediate medical attention should they occur. Patient verbalized understanding of these instructions and education.  Patient Consent Given: Yes Education handout provided: Yes Muscles treated: R upper trap, R biceps including insertion, R abductor digiti minimi Treatment response/outcome: deep ache/muscle cramp; muscle twitch detected   Trigger Point Dry Needling  What is Trigger Point Dry Needling (DN)? DN is a physical therapy technique used to treat muscle pain and dysfunction. Specifically, DN helps deactivate muscle trigger points (muscle knots).  A thin filiform needle is used to penetrate the skin and stimulate the underlying trigger point. The goal is for a local twitch response (LTR) to occur and for the trigger point to relax. No medication of any kind is injected during the procedure.   What Does Trigger Point Dry Needling Feel Like?  The procedure feels different for each individual patient. Some patients report that they do not actually feel the needle enter the skin and overall the process is not painful. Very mild bleeding may occur. However, many patients feel a deep cramping in the muscle in which the needle was inserted. This is the local twitch response.   How Will I feel after the treatment? Soreness is normal, and the onset of soreness may not occur for a few hours. Typically this soreness does not last longer than two days.  Bruising is uncommon, however; ice can be used to decrease any possible bruising.  In rare cases feeling tired or nauseous after the treatment is normal. In addition, your symptoms may get worse before they get better, this period will typically not last longer than 24 hours.   What Can I do After My  Treatment? Increase your hydration by  drinking more water for the next 24 hours. You may place ice or heat on the areas treated that have become sore, however, do not use heat on inflamed or bruised areas. Heat often brings more relief post needling. You can continue your regular activities, but vigorous activity is not recommended initially after the treatment for 24 hours. DN is best combined with other physical therapy such as strengthening, stretching, and other therapies.     TherEx Following TPDN to address ongoing tight muscles: Seated UT stretch 3 x 30 sec each Seated levator scap stretch 3 x 30 sec each  Added to HEP, see bolded below  PATIENT EDUCATION: Education details: TPDN (see above), added to HEP Person educated: Patient Education method: Medical illustrator Education comprehension: verbalized understanding  HOME EXERCISE PROGRAM: Access Code: [email or web address] URL: [email or web address] Date: 09/14/2022 Prepared by: Alethia Berthold Plaster  Exercises - Feet together with eyes closed and head turns   - 1 x daily - 7 x weekly - 3-4 reps - 20-30 second hold - Tandem Stance in Corner  - 1 x daily - 7 x weekly - 3-4 reps - 15-30 second  hold - Seated Upper Trapezius Stretch  - 1 x daily - 7 x weekly - 1 sets - 5 reps - 30 sec hold - Gentle Levator Scapulae Stretch  - 1 x daily - 7 x weekly - 1 sets - 5 reps - 30 sec hold  GOALS: Goals reviewed with patient? Yes   NEW LONG TERM GOALS FOR UPDATED POC: Target date: 11/19/2022   Pt will be independent with final HEP for improved strength, balance, transfers and gait.  Baseline:  Goal status: IN PROGRESS  2.  Pt will improve 5 x STS to less than or equal to 13 seconds without UE support to demonstrate improved functional strength and transfer efficiency.   Baseline: 16.62s w/hands braced on knees; 16.15s w/o UE support (4/11) Goal status: IN PROGRESS  3.  Pt will improve gait velocity to at least 2.8 ft/s  w/LRAD for improved gait efficiency and reduced recurrent fall risk   Baseline: 1.27 ft/s without AD; 2.6 ft/s without AD (3/27); 2.74 ft/s no AD (4/11) Goal status: IN PROGRESS  4.  Pt will improve Berg score to 52/56 for decreased fall risk  Baseline: 49/56; improved to 53/56 Goal status: MET  5.  Pt will improve FOTO to 61 for improved subjective functional ability   Baseline: 47; 60 (3/27); improved to 72 on 10/22/2022  Goal Status: MET  6.  Patient will improve Mini-BEST score to 21/28 or greater to indicate a decreased risk of falls and improved static and dynamic stability to help increase patient's confidence in community/home environment.   Baseline: 17/28  Goal Status: Initial  ASSESSMENT:  CLINICAL IMPRESSION: Emphasis of skilled PT session on initiating TPDN followed by stretching to address painful and tight muscles in R shoulder and UE. Pt with decreased tightness following treatment this date, can benefit from future sessions of TPDN if agreeable. Pt continues to benefit from skilled therapy services to work towards improving overall pain management, strength, balance, and safety and independence with functional mobility. Continue POC.   OBJECTIVE IMPAIRMENTS: Abnormal gait, decreased activity tolerance, decreased balance, decreased coordination, decreased endurance, difficulty walking, decreased strength, impaired sensation, and pain  ACTIVITY LIMITATIONS: carrying, lifting, bending, squatting, stairs, transfers, bed mobility, self feeding, hygiene/grooming, locomotion level, and caring for others  PARTICIPATION LIMITATIONS: meal prep, cleaning, medication management, interpersonal relationship, community activity, and yard work  PERSONAL  FACTORS: Age, Fitness, Past/current experiences, and 1-2 comorbidities: HTN and CVA  are also affecting patient's functional outcome.   REHAB POTENTIAL: Good  CLINICAL DECISION MAKING: Stable/uncomplicated  EVALUATION COMPLEXITY:  Low  PLAN:  PT FREQUENCY: 1-2x/week  PT DURATION: 6 weeks  PLANNED INTERVENTIONS: Therapeutic exercises, Therapeutic activity, Neuromuscular re-education, Balance training, Gait training, Patient/Family education, Self Care, Joint mobilization, Stair training, DME instructions, Dry Needling, Manual therapy, and Re-evaluation  PLAN FOR NEXT SESSION: TPDN to RUE and upper traps, Balance w/reduced vestibular input. RLE strength and single leg stability    Peter Congo, PT, DPT, CSRS 11/05/2022, 3:29 PM

## 2022-11-06 ENCOUNTER — Ambulatory Visit: Payer: Self-pay | Admitting: Physical Therapy

## 2022-11-07 ENCOUNTER — Ambulatory Visit: Payer: Medicare Other | Attending: Adult Health | Admitting: Occupational Therapy

## 2022-11-07 ENCOUNTER — Telehealth: Payer: Self-pay | Admitting: Adult Health

## 2022-11-07 ENCOUNTER — Encounter: Payer: Self-pay | Admitting: Occupational Therapy

## 2022-11-07 ENCOUNTER — Ambulatory Visit: Payer: Medicare Other | Admitting: Physical Therapy

## 2022-11-07 VITALS — BP 183/93 | HR 69

## 2022-11-07 DIAGNOSIS — R2681 Unsteadiness on feet: Secondary | ICD-10-CM | POA: Diagnosis present

## 2022-11-07 DIAGNOSIS — R278 Other lack of coordination: Secondary | ICD-10-CM | POA: Insufficient documentation

## 2022-11-07 DIAGNOSIS — M6281 Muscle weakness (generalized): Secondary | ICD-10-CM | POA: Insufficient documentation

## 2022-11-07 DIAGNOSIS — R2689 Other abnormalities of gait and mobility: Secondary | ICD-10-CM | POA: Diagnosis present

## 2022-11-07 DIAGNOSIS — I69351 Hemiplegia and hemiparesis following cerebral infarction affecting right dominant side: Secondary | ICD-10-CM | POA: Insufficient documentation

## 2022-11-07 DIAGNOSIS — R208 Other disturbances of skin sensation: Secondary | ICD-10-CM | POA: Insufficient documentation

## 2022-11-07 NOTE — Telephone Encounter (Signed)
-----   Message from Sheran Lawless, OT sent at 11/07/2022  9:49 AM EDT -----  Jerome Cardenas,   We are currently seeing the above patient next door for PT/OT following stroke in January.  I have also noticed some things that have me suspecting possible Parkinson's including: difficulty sleeping (which he reports prior to stroke), micrographia in handwriting (again reports prior to stroke), flat/masked face, stiffness in bilateral shoulders (not just affected Rt side), decreased Rt arm swing (which could be from stroke).  I just wanted you to be aware of what I'm seeing for further work up prn.   Thank you,  Jene Every, OTR/L

## 2022-11-07 NOTE — Therapy (Signed)
OUTPATIENT PHYSICAL THERAPY NEURO TREATMENT - ARRIVED NO CHARGE   Patient Name: Jerome Cardenas MRN: [id number] DOB:[birth year], 72 y.o., male Today's Date: 11/07/2022  PCP: Dr. Michelle Nasuti in [place]  REFERRING PROVIDER: Butch Penny, NP   END OF SESSION:  PT End of Session - 11/07/22 0932     Visit Number 18   Arrived no charge   Number of Visits 27    Date for PT Re-Evaluation 12/06/22    Authorization Type Medicare    Progress Note Due on Visit 10    PT Start Time 0931    PT Stop Time 0950   Arrived no charge   PT Time Calculation (min) 19 min    Equipment Utilized During Treatment --    Activity Tolerance Treatment limited secondary to medical complications (Comment)   HTN   Behavior During Therapy [facility] for tasks assessed/performed;Flat affect               Past Medical History:  Diagnosis Date   Cancer (HCC)    prostate   CKD (chronic kidney disease) stage 2, GFR 60-89 ml/min 07/28/2022   HLD (hyperlipidemia)    Hypertension    Past Surgical History:  Procedure Laterality Date   APPENDECTOMY     CHOLECYSTECTOMY     HERNIA REPAIR     PROSTATE SURGERY     prostate cancer 2014   Patient Active Problem List   Diagnosis Date Noted   Diabetes mellitus type 2 with neurological manifestations (HCC) 07/29/2022   Hypertension associated with diabetes (HCC) 07/29/2022   Hyperlipidemia associated with type 2 diabetes mellitus (HCC) 07/29/2022   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 Slept terrible last night. No falls.    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 No  VITALS Vitals:   11/07/22 0946 11/07/22 0949  BP: (!) 183/99 (!) 183/93  Pulse: 68 69  PRECAUTIONS: Fall  WEIGHT BEARING RESTRICTIONS: No  FALLS: Has patient fallen in last 6 months? No  LIVING ENVIRONMENT: Lives with: lives with their spouse Lives in: House/apartment Stairs: Yes: Internal: 7 +7 steps; on left going up Has following equipment at home: Quad cane small base  PLOF: Independent  PATIENT GOALS: "mainly getting the movement back in my hand like it was"  OBJECTIVE:   DIAGNOSTIC FINDINGS: MRI of brain on 07/28/22 IMPRESSION: Acute left thalamocapsular infarct.  CT of head/neck on 07/29/22 IMPRESSION: 1. Short segment occlusion of the left PCA P3 segment. 2. No proximal large vessel occlusion. 3. Chronic ischemic microangiopathy.  COGNITION: Overall cognitive status: Within functional limits for tasks assessed   SENSATION: Pt reports constant numbness/tingling in R hemibody   COORDINATION: Heel to shin test: WFL on LLE, dysmetric on RLE   EDEMA: Pt reports new edema in distal RLE and RUE   POSTURE: rounded shoulders, forward head, posterior pelvic tilt, and weight shift left  LOWER EXTREMITY MMT:  Tested in Seated position   MMT Right Eval Left Eval  Hip flexion 3+ 4  Hip extension    Hip abduction 4 4  Hip adduction 3+ 4-  Hip internal rotation    Hip external rotation    Knee flexion 2 4  Knee extension 3+ 4  Ankle dorsiflexion 4+ 4+  Ankle plantarflexion    Ankle inversion    Ankle eversion    (Blank rows = not tested)   TODAY'S TREATMENT:     ARRIVED NO CHARGE   Assessed pt's vitals (see above) and pt's diastolic BP too elevated to participate in therapy. Pt reports he has not eaten today and did not take his medications this morning due to getting appointment time wrong. Arrived no charge due to elevated BP.   PATIENT EDUCATION: Education details: Continue HEP, updated schedule  Person educated: Patient Education method: Explanation and Handouts Education comprehension: verbalized understanding  HOME EXERCISE PROGRAM: Access Code: [email or web address] URL: [email or web address] Date: 09/14/2022 Prepared by: Alethia Berthold Orlan [employer]  Exercises - Feet together with eyes closed and head turns   - 1 x daily - 7 x weekly - 3-4 reps - 20-30 second hold - Tandem Stance in Corner  - 1 x daily - 7 x weekly - 3-4 reps - 15-30 second  hold - Seated Upper Trapezius Stretch  - 1 x daily - 7 x weekly - 1 sets - 5 reps - 30 sec hold - Gentle Levator Scapulae Stretch  - 1 x daily - 7 x weekly - 1 sets - 5 reps - 30 sec hold  GOALS: Goals reviewed with patient? Yes   NEW LONG TERM GOALS FOR UPDATED POC: Target date: 11/19/2022   Pt will be independent with final HEP for improved strength, balance, transfers and gait.  Baseline:  Goal status: IN PROGRESS  2.  Pt will improve 5 x STS to less than or equal to 13 seconds without UE support to demonstrate improved functional strength and transfer efficiency.   Baseline: 16.62s w/hands braced on knees; 16.15s w/o UE support (4/11) Goal status: IN PROGRESS  3.  Pt will improve gait velocity to at least 2.8 ft/s w/LRAD for improved gait efficiency and reduced recurrent fall risk   Baseline: 1.27 ft/s without AD; 2.6 ft/s without AD (3/27); 2.74 ft/s no AD (4/11) Goal status: IN PROGRESS  4.  Pt will improve Berg score to 52/56 for decreased fall risk  Baseline: 49/56; improved to 53/56 Goal status: MET  5.  Pt will improve FOTO to 61 for improved subjective functional ability   Baseline: 47; 60 (3/27);  improved to 72 on 10/22/2022  Goal Status: MET  6.  Patient will improve Mini-BEST score to 21/28 or greater to indicate a decreased risk of falls and improved static and dynamic stability to help increase patient's confidence in community/home environment.   Baseline: 17/28  Goal Status: Initial  ASSESSMENT:  CLINICAL IMPRESSION: Arrived no charge due to HTN. Added PT appointments through May and provided pt w/updated calendar.   OBJECTIVE IMPAIRMENTS: Abnormal gait, decreased activity tolerance, decreased balance, decreased coordination, decreased endurance, difficulty walking, decreased strength, impaired sensation, and pain  ACTIVITY LIMITATIONS: carrying, lifting, bending, squatting, stairs, transfers, bed mobility, self feeding, hygiene/grooming, locomotion level, and caring for others  PARTICIPATION LIMITATIONS: meal prep, cleaning, medication management, interpersonal relationship, community activity, and yard work  PERSONAL FACTORS: Age, Fitness, Past/current experiences, and 1-2 comorbidities: HTN and CVA  are also affecting patient's functional outcome.   REHAB POTENTIAL: Good  CLINICAL DECISION MAKING: Stable/uncomplicated  EVALUATION COMPLEXITY: Low  PLAN:  PT FREQUENCY: 1-2x/week  PT DURATION: 6 weeks  PLANNED INTERVENTIONS: Therapeutic exercises, Therapeutic activity, Neuromuscular re-education, Balance training, Gait training, Patient/Family education, Self Care, Joint mobilization, Stair training, DME instructions, Dry Needling, Manual therapy, and Re-evaluation  PLAN FOR NEXT SESSION: TPDN to RUE and upper traps, Balance w/reduced vestibular input. RLE strength and single leg stability    Loucinda Croy E Estephan Gallardo, PT, DPT 11/07/2022, 10:09 AM

## 2022-11-07 NOTE — Patient Instructions (Signed)
Cranial Flexion: Overhead Arm Extension - Supine ([employer])    Lie with knees bent, arms beyond head, holding paper towel roll. Pull ball up to above face. Repeat __10__ times per set. Do __2__ sessions per day.    ROM: Abduction - Wand    Holding wand with left hand palm up, push wand directly out to Lt side, leading with other hand palm down, until stretch is felt. Hold __3__ seconds. Repeat __10__ times per set. Repeat to Rt side with Rt palm up. Do __2__ sessions per day.   Cane Exercise: Extension    Stand holding cane behind back with both hands palms forward. Lift the cane away from body. Hold __5__ seconds. Repeat _10___ times. Do _2___ sessions per day.

## 2022-11-07 NOTE — Therapy (Signed)
OUTPATIENT OCCUPATIONAL THERAPY NEURO TREATMENT   Patient Name: Jerome Cardenas MRN: [id number] DOB:1951-02-26, 72 y.o., male Today's Date: 11/07/2022  PCP: Dr. Michelle Nasuti in [place]  REFERRING PROVIDER: Butch Penny, NP  END OF SESSION:  OT End of Session - 11/07/22 0900     Visit Number 7    Number of Visits 21    Date for OT Re-Evaluation 12/20/22    Authorization Type Medicare    Progress Note Due on Visit 10    OT Start Time 0848    OT Stop Time 0930    OT Time Calculation (min) 42 min    Activity Tolerance Patient tolerated treatment well    Behavior During Therapy [facility] for tasks assessed/performed            Past Medical History:  Diagnosis Date   Cancer (HCC)    prostate   CKD (chronic kidney disease) stage 2, GFR 60-89 ml/min 07/28/2022   HLD (hyperlipidemia)    Hypertension    Past Surgical History:  Procedure Laterality Date   APPENDECTOMY     CHOLECYSTECTOMY     HERNIA REPAIR     PROSTATE SURGERY     prostate cancer 2014   Patient Active Problem List   Diagnosis Date Noted   Diabetes mellitus type 2 with neurological manifestations (HCC) 07/29/2022   Hypertension associated with diabetes (HCC) 07/29/2022   Hyperlipidemia associated with type 2 diabetes mellitus (HCC) 07/29/2022   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    ONSET DATE: 07/26/2022  REFERRING DIAG: D32.202 (ICD-10-CM) - Hemiplegia and hemiparesis following cerebral infarction affecting right dominant side   THERAPY DIAG:  Hemiplegia and hemiparesis following cerebral infarction affecting right dominant side (HCC)  Muscle weakness (generalized)  Other lack of coordination  Rationale for Evaluation and Treatment: Rehabilitation  SUBJECTIVE:   SUBJECTIVE STATEMENT: My arm feels dead. Tightness in Rt shoulder (along upper traps)   Pt accompanied by: self  PERTINENT HISTORY: "CEDERIC MOZLEY is a 72 y.o. male presenting with left thalamic stroke outside of TPA window. High  risk due to current malignancy and HLD. PMH significant for CKD stage II, HTN, GERD, vitamin D deficiency, hyperlipidemia, BPH, metastatic prostate cancer s/p radiation therapy.    Patient reported symptoms of right arm and leg weakness with sensation changes starting 1/18.  Presented to the hospital 1/20 after he developed right facial tingling that morning.  Patient was outside of tPA window.  CT head showed only chronic small vessel ischemia, no bleeding.  MRI head with acute left thalamic capsular infarct.  CTA demonstrates short segment occlusion of left PCA P3 segment and chronic ischemic microangiopathy with no large vessel occlusion.  Echo with LVEF 60-65%. No RWA. Mild LVH. G1DD.    Neurology consulted, recommended routine at risk stratification.  Was found to have A1c of 6.6 and lipids significant for triglycerides 371, HDL 23, and VLDL 74.  TSH, CBC, and BMP unremarkable aside from elevated glucose to 200."  evaluation for RUE weakness and difficulty performing ADLs   PRECAUTIONS: Fall  WEIGHT BEARING RESTRICTIONS: No  PAIN:  Are you having pain? Yes: NPRS scale: 2-5/10 Pain location: R elbow Pain description: aching and numbness Aggravating factors: pt reports today sh ER hurts elbow (previously supinating arm or straightening elbow) Relieving factors: holding it at 90 degrees/near body  FALLS: Has patient fallen in last 6 months? No  LIVING ENVIRONMENT: Lives with: lives with their spouse Lives in: House/apartment Stairs: Yes: Internal: 7 +7 steps; on left going  up Has following equipment at home: Quad cane small base  PLOF: Independent; driving; Office manager, worked in Office manager  PATIENT GOALS: Improve writing; get into his truck easier like before  OBJECTIVE: (from evaluation unless otherwise noted)  HAND DOMINANCE: Right  ADLs: Overall ADLs: mod I IADLs: Community mobility: driving currently Handwriting: 25% legible; writing gets sloppier with time  MOBILITY  STATUS: Independent  ACTIVITY TOLERANCE: Activity tolerance: fair  FUNCTIONAL OUTCOME MEASURES: FOTO: Slight: 67  UPPER EXTREMITY ROM:     AROM Right (eval) Left (eval)  Shoulder flexion WNL WNL  Shoulder abduction WNL WNL  Elbow flexion WNL WNL  Elbow extension WNL WNL  Wrist flexion WNL WNL  Wrist extension WNL WNL  Wrist pronation WNL WNL  Wrist supination WNL WNL   Digit Composite Flexion WNL WNL  Digit Composite Extension WNL WNL  Digit Opposition WNL WNL  (Blank rows = not tested)  UPPER EXTREMITY MMT:     MMT Right (eval) Left (eval)  Shoulder flexion [facility] WNL  Shoulder abduction WFL WNL  Elbow flexion WFL WNL  Elbow extension WFL WNL  (Blank rows = not tested)  HAND FUNCTION: Grip strength: Right: 77.1 lbs; Left: 77.6 lbs  COORDINATION: 9 Hole Peg test: Right: 37 sec; Left: 24 sec  11/01/22 Right hand - 26.93 seconds  SENSATION: Reports paresthesias in RUE from shoulder distally  EDEMA: Reports mild to moderate swelling especially at night; no observed edema at eval.   MUSCLE TONE: WFL  COGNITION: Overall cognitive status: Within functional limits for tasks assessed  VISION: Subjective report: He went to the optometrist last month; reports no change; no double vision Baseline vision: Wears glasses all the time and Progressive lenses Visual history:  n/a  OBSERVATIONS: 11/01/22 - Pt ambulates without LOB with and without cane for short distances in the gym. Glasses donned.    TODAY'S TREATMENT:                                  Kinesiotaping to relax upper traps RUE d/t tightness and initial reports of pain - Pt instructed in wear and care. Later denies pain   Practiced handwriting - pt initially w/ only approx 50% legibility (cursive), however improved w/ cues to slow down, aim big, and formulate each letter. Pt later stated he normally writes in print, therefore practiced in print with same cues to slow down, aim big and formulate each letter  w/ 100% legibility.   Pt issued shoulder HEP d/t bilateral shoulder tightness - see pt instructions for details. Pt initially grimaced w/ sh abduction but denies pain.   PATIENT EDUCATION: Education details: shoulder HEP  Person educated: Patient Education method: Explanation, Demonstration, Tactile cues, Verbal cues, and Handouts Education comprehension: verbalized understanding, returned demonstration, verbal cues required, tactile cues required, and needs further education  HOME EXERCISE PROGRAM:  10/18/22: coordination and putty HEP 10/25/22: bed positioning, scapula HEP, Sh flex HEP 10/29/22: Reviewed bed positioning and UE ROM including with use of cane 11/01/22: Reviewed UE ROM with cane (will need handouts next visit) 11/07/22: Shoulder HEP   GOALS:  SHORT TERM GOALS: Target date: 11/08/2022    Patient will demonstrate independence with initial RUE HEP. Baseline: Goal status: IN PROGRESS  2.  Pt will report no difficulty with combing or brushing hair. Baseline: a little difficulty Goal status: MET  3.  Pt will report ability to place dish in overhead cabinet with no difficulty  Baseline: a little difficulty Goal status: MET   LONG TERM GOALS: Target date: 12/20/2022    Patient will demonstrate updated RUE HEP with 25% verbal cues or less for proper execution. Baseline:  Goal status: INITIAL  2.  Patient will complete nine-hole peg with use of R in 30 seconds or less. Baseline: 37 sec Goal status: MET  3.  Pt will report handwriting is at least 75% of prior level. Baseline: 25% Goal status: MET in print (cues to slow down, aim big, formulate each letter)  4.  Pt will complete d/c FOTO Baseline:  Goal status: INITIAL  ASSESSMENT:  CLINICAL IMPRESSION: Pt with continued stiffness in Rt shoulder (but also in Lt shoulder) and mixed reports of pain vs stiffness. Pt with improved handwriting w/ cueing.  Remains good candidate for skilled OT services to maximize  independence and safety with ADLs and IADLs.   PERFORMANCE DEFICITS: in functional skills including ADLs, IADLs, coordination, sensation, edema, strength, pain, Fine motor control, and UE functional use.   IMPAIRMENTS: are limiting patient from ADLs, IADLs, rest and sleep, leisure, and social participation.   CO-MORBIDITIES: may have co-morbidities  that affects occupational performance. Patient will benefit from skilled OT to address above impairments and improve overall function.  REHAB POTENTIAL: Excellent  PLAN:  OT FREQUENCY: 2x/week  OT DURATION: up to 10 weeks as needed   PLANNED INTERVENTIONS: self care/ADL training, therapeutic exercise, therapeutic activity, neuromuscular re-education, ultrasound, paraffin, fluidotherapy, moist heat, contrast bath, patient/family education, visual/perceptual remediation/compensation, energy conservation, DME and/or AE instructions, and Re-evaluation  RECOMMENDED OTHER SERVICES: None at this time  CONSULTED AND AGREED WITH PLAN OF CARE: Patient  PLAN FOR NEXT SESSION: trial self performance of massage techniques to back/scapula with tennis ball, continue progress towards remaining goals.    Sheran Lawless, OT 11/07/2022, 9:03 AM

## 2022-11-13 ENCOUNTER — Ambulatory Visit: Payer: Medicare Other | Admitting: Occupational Therapy

## 2022-11-13 ENCOUNTER — Ambulatory Visit: Payer: Medicare Other | Admitting: Physical Therapy

## 2022-11-13 VITALS — BP 164/88 | HR 76

## 2022-11-13 DIAGNOSIS — I69351 Hemiplegia and hemiparesis following cerebral infarction affecting right dominant side: Secondary | ICD-10-CM

## 2022-11-13 DIAGNOSIS — M6281 Muscle weakness (generalized): Secondary | ICD-10-CM

## 2022-11-13 DIAGNOSIS — R2681 Unsteadiness on feet: Secondary | ICD-10-CM

## 2022-11-13 DIAGNOSIS — R2689 Other abnormalities of gait and mobility: Secondary | ICD-10-CM

## 2022-11-13 DIAGNOSIS — R278 Other lack of coordination: Secondary | ICD-10-CM

## 2022-11-13 DIAGNOSIS — R208 Other disturbances of skin sensation: Secondary | ICD-10-CM

## 2022-11-13 NOTE — Therapy (Signed)
OUTPATIENT PHYSICAL THERAPY NEURO TREATMENT   Patient Name: Jerome Cardenas MRN: [id number] DOB:[birth year], 72 y.o., male Today's Date: 11/13/2022  PCP: Dr. Michelle Nasuti in [place]  REFERRING PROVIDER: Butch Penny, NP   END OF SESSION:  PT End of Session - 11/13/22 1103     Visit Number 19    Number of Visits 27    Date for PT Re-Evaluation 12/06/22    Authorization Type Medicare    Progress Note Due on Visit 10    PT Start Time 1100    PT Stop Time 1143    PT Time Calculation (min) 43 min    Equipment Utilized During Treatment Gait belt    Activity Tolerance Patient tolerated treatment well   HTN   Behavior During Therapy WFL for tasks assessed/performed;Flat affect                Past Medical History:  Diagnosis Date   Cancer (HCC)    prostate   CKD (chronic kidney disease) stage 2, GFR 60-89 ml/min 07/28/2022   HLD (hyperlipidemia)    Hypertension    Past Surgical History:  Procedure Laterality Date   APPENDECTOMY     CHOLECYSTECTOMY     HERNIA REPAIR     PROSTATE SURGERY     prostate cancer 2014   Patient Active Problem List   Diagnosis Date Noted   Diabetes mellitus type 2 with neurological manifestations (HCC) 07/29/2022   Hypertension associated with diabetes (HCC) 07/29/2022   Hyperlipidemia associated with type 2 diabetes mellitus (HCC) 07/29/2022   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    ONSET DATE: 08/29/2022 (referral)  REFERRING DIAG: N82.956,O13.0 (ICD-10-CM) - Abnormality of gait as late effect of stroke  THERAPY DIAG:  Muscle weakness (generalized)  Hemiplegia and hemiparesis following cerebral infarction affecting right dominant side (HCC)  Unsteadiness on feet  Other abnormalities of gait and mobility  Rationale for Evaluation and Treatment: Rehabilitation  SUBJECTIVE:                                                                                                                                                                                              SUBJECTIVE STATEMENT: Pt reports he did have a fall since last visit, was on the floor playing with his dog and when he tried to get back up from the floor he lost his balance and landed in the recliner. Pt reports he wasn't hurt with the fall, was able to get back up from the recliner. Pt reports this was the first time he had tried getting to/from the floor. Pt also reports his BP has been elevated (154/96  this morning).   Pt reports he didn't notice a change in his muscle tightness following the TPDN and is not sure how helpful it was, does have some bruising in R UT area.  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 No  VITALS Vitals:   11/13/22 1110 11/13/22 1111  BP: (!) 164/92 (!) 164/88  Pulse: 76 76    PRECAUTIONS: Fall  WEIGHT BEARING RESTRICTIONS: No  FALLS: Has patient fallen in last 6 months? No  LIVING ENVIRONMENT: Lives with: lives with their spouse Lives in: House/apartment Stairs: Yes: Internal: 7 +7 steps; on left going up Has following equipment at home: Quad cane small base  PLOF: Independent  PATIENT GOALS: "mainly getting the movement back in my hand like it was"  OBJECTIVE:   DIAGNOSTIC FINDINGS: MRI of brain on 07/28/22 IMPRESSION: Acute left thalamocapsular infarct.  CT of head/neck on 07/29/22 IMPRESSION: 1. Short segment occlusion of the left PCA P3 segment. 2. No proximal large vessel occlusion. 3. Chronic ischemic microangiopathy.  COGNITION: Overall cognitive status: Within functional limits for tasks assessed   SENSATION: Pt reports constant numbness/tingling in R hemibody   COORDINATION: Heel to shin test: WFL on LLE, dysmetric on RLE   EDEMA: Pt reports new edema in distal RLE and RUE   POSTURE: rounded shoulders, forward head,  posterior pelvic tilt, and weight shift left  LOWER EXTREMITY MMT:  Tested in Seated position   MMT Right Eval Left Eval  Hip flexion 3+ 4  Hip extension    Hip abduction 4 4  Hip adduction 3+ 4-  Hip internal rotation    Hip external rotation    Knee flexion 2 4  Knee extension 3+ 4  Ankle dorsiflexion 4+ 4+  Ankle plantarflexion    Ankle inversion    Ankle eversion    (Blank rows = not tested)   TODAY'S TREATMENT:      Seated BP assessed at beginning of session, see above. BP within safe limits to participate in therapy session this date but does remain elevated. Encouraged pt to keep at BP log (take BP prior to taking his medication and about an hour after taking his medication) and to bring his BP log to next therapy session.  TherAct In // bars with no UE support to work on standing balance with decreased proprioceptive and vestibular input: Normal stance on airex Horizontal and vertical head turns (LOB posteriorly with vertical head turns) Romberg stance on airex Horizontal and vertical head turns (LOB to the R and posteriorly) L/R modified tandem stance on airex Horizontal and vertical head turns with no LOB Stance on rocker board (A/P) Horizontal head turns (no LOB) and vertical head turns (posterior LOB with min A needed to prevent fall, worked on use of ankle strategy) Stance on rocker board (L/R) Horizontal head turns with LOB to the R, vertical head turns with LOB to the R and to the L (most difficulty with this) Also with onset of foot pain after working on this, resolves with rest break  Fall recovery: Standing to kneeling on floor with SBA with use of mat table. Pt able to transition from tall-kneeling to sitting on floor, back to tall-kneeling then to half-kneeling and back to standing with assist from mat table with SBA. Encouraged pt to utilize the seat of his recliner or his couch to try  and get back up next time at home instead of the arm of the couch. Pt  understanding of education and a safer way to perform floor transfers.   PATIENT EDUCATION: Education details: Continue HEP Person educated: Patient Education method: Explanation Education comprehension: verbalized understanding  HOME EXERCISE PROGRAM: Access Code: [email or web address] URL: [email or web address] Date: 09/14/2022 Prepared by: Alethia Berthold Plaster  Exercises - Feet together with eyes closed and head turns   - 1 x daily - 7 x weekly - 3-4 reps - 20-30 second hold - Tandem Stance in Corner  - 1 x daily - 7 x weekly - 3-4 reps - 15-30 second  hold - Seated Upper Trapezius Stretch  - 1 x daily - 7 x weekly - 1 sets - 5 reps - 30 sec hold - Gentle Levator Scapulae Stretch  - 1 x daily - 7 x weekly - 1 sets - 5 reps - 30 sec hold  GOALS: Goals reviewed with patient? Yes   NEW LONG TERM GOALS FOR UPDATED POC: Target date: 11/19/2022   Pt will be independent with final HEP for improved strength, balance, transfers and gait.  Baseline:  Goal status: IN PROGRESS  2.  Pt will improve 5 x STS to less than or equal to 13 seconds without UE support to demonstrate improved functional strength and transfer efficiency.   Baseline: 16.62s w/hands braced on knees; 16.15s w/o UE support (4/11) Goal status: IN PROGRESS  3.  Pt will improve gait velocity to at least 2.8 ft/s w/LRAD for improved gait efficiency and reduced recurrent fall risk   Baseline: 1.27 ft/s without AD; 2.6 ft/s without AD (3/27); 2.74 ft/s no AD (4/11) Goal status: IN PROGRESS  4.  Pt will improve Berg score to 52/56 for decreased fall risk  Baseline: 49/56; improved to 53/56 Goal status: MET  5.  Pt will improve FOTO to 61 for improved subjective functional ability   Baseline: 47; 60 (3/27); improved to 72 on 10/22/2022  Goal Status: MET  6.  Patient will improve Mini-BEST score to 21/28 or greater to indicate a decreased risk of falls and improved static and dynamic stability to help increase patient's  confidence in community/home environment.   Baseline: 17/28  Goal Status: Initial  ASSESSMENT:  CLINICAL IMPRESSION: Emphasis of skilled PT session on working on static standing balance with proprioceptive and vestibular challenges as well as working on fall recovery due to pt having a fall since last visit. Pt exhibits the most challenge with standing balance with vertical head movements and on rockerboard with both A/P and L/R orientations. Pt exhibits good ability to perform a safe floor transfer with good understanding of how to more safely perform at home in the future. Pt continues to benefit from skilled therapy services to work towards improving his overall balance and RLE strength in order to decrease his fall risk. Continue POC.   OBJECTIVE IMPAIRMENTS: Abnormal gait, decreased activity tolerance, decreased balance, decreased coordination, decreased endurance, difficulty walking, decreased strength, impaired sensation, and pain  ACTIVITY LIMITATIONS: carrying, lifting, bending, squatting, stairs, transfers, bed mobility, self feeding, hygiene/grooming, locomotion level, and caring for others  PARTICIPATION LIMITATIONS: meal prep, cleaning, medication management, interpersonal relationship, community activity, and yard work  PERSONAL FACTORS: Age, Fitness, Past/current experiences, and 1-2 comorbidities: HTN and CVA  are also affecting patient's functional outcome.   REHAB POTENTIAL: Good  CLINICAL DECISION MAKING: Stable/uncomplicated  EVALUATION COMPLEXITY: Low  PLAN:  PT FREQUENCY: 1-2x/week  PT DURATION: 6 weeks  PLANNED INTERVENTIONS: Therapeutic  exercises, Therapeutic activity, Neuromuscular re-education, Balance training, Gait training, Patient/Family education, Self Care, Joint mobilization, Stair training, DME instructions, Dry Needling, Manual therapy, and Re-evaluation  PLAN FOR NEXT SESSION: 20th PN, Balance w/reduced vestibular input. RLE strength and single leg  stability    Peter Congo, PT, DPT, CSRS 11/13/2022, 11:44 AM

## 2022-11-13 NOTE — Therapy (Signed)
OUTPATIENT OCCUPATIONAL THERAPY NEURO TREATMENT   Patient Name: Jerome Cardenas MRN: [id number] DOB:[birth year], 72 y.o., male Today's Date: 11/13/2022  PCP: Dr. Michelle Nasuti in [place]  REFERRING PROVIDER: Butch Penny, NP  END OF SESSION:  OT End of Session - 11/13/22 0928     Visit Number 8    Number of Visits 21    Date for OT Re-Evaluation 12/20/22    Authorization Type Medicare    Progress Note Due on Visit 10    OT Start Time [phone]    OT Stop Time 1010    OT Time Calculation (min) 45 min    Activity Tolerance Patient tolerated treatment well    Behavior During Therapy [facility] for tasks assessed/performed            Past Medical History:  Diagnosis Date   Cancer (HCC)    prostate   CKD (chronic kidney disease) stage 2, GFR 60-89 ml/min 07/28/2022   HLD (hyperlipidemia)    Hypertension    Past Surgical History:  Procedure Laterality Date   APPENDECTOMY     CHOLECYSTECTOMY     HERNIA REPAIR     PROSTATE SURGERY     prostate cancer 2014   Patient Active Problem List   Diagnosis Date Noted   Diabetes mellitus type 2 with neurological manifestations (HCC) 07/29/2022   Hypertension associated with diabetes (HCC) 07/29/2022   Hyperlipidemia associated with type 2 diabetes mellitus (HCC) 07/29/2022   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    ONSET DATE: 07/26/2022  REFERRING DIAG: J19.147 (ICD-10-CM) - Hemiplegia and hemiparesis following cerebral infarction affecting right dominant side   THERAPY DIAG:  Hemiplegia and hemiparesis following cerebral infarction affecting right dominant side (HCC)  Muscle weakness (generalized)  Other lack of coordination  Other disturbances of skin sensation  Rationale for Evaluation and Treatment: Rehabilitation  SUBJECTIVE:   SUBJECTIVE STATEMENT: I didn't notice much difference in the tape (kinesiotape). I fell 2 days ago trying to get up from the floor when playing with the dog. My Rt arm feels dead - it was cold as  ice this morning  Pt accompanied by: self  PERTINENT HISTORY: "Jerome Cardenas is a 72 y.o. male presenting with left thalamic stroke outside of TPA window. High risk due to current malignancy and HLD. PMH significant for CKD stage II, HTN, GERD, vitamin D deficiency, hyperlipidemia, BPH, metastatic prostate cancer s/p radiation therapy.    Patient reported symptoms of right arm and leg weakness with sensation changes starting 1/18.  Presented to the hospital 1/20 after he developed right facial tingling that morning.  Patient was outside of tPA window.  CT head showed only chronic small vessel ischemia, no bleeding.  MRI head with acute left thalamic capsular infarct.  CTA demonstrates short segment occlusion of left PCA P3 segment and chronic ischemic microangiopathy with no large vessel occlusion.  Echo with LVEF 60-65%. No RWA. Mild LVH. G1DD.    Neurology consulted, recommended routine at risk stratification.  Was found to have A1c of 6.6 and lipids significant for triglycerides 371, HDL 23, and VLDL 74.  TSH, CBC, and BMP unremarkable aside from elevated glucose to 200."  evaluation for RUE weakness and difficulty performing ADLs   PRECAUTIONS: Fall  WEIGHT BEARING RESTRICTIONS: No  PAIN:  Are you having pain? Yes: NPRS scale: 0/10 today Pain location: R elbow Pain description: aching and numbness Aggravating factors: pt reports today sh ER hurts elbow (previously supinating arm or straightening elbow) Relieving factors: holding it at  90 degrees/near body  FALLS: Has patient fallen in last 6 months? No  LIVING ENVIRONMENT: Lives with: lives with their spouse Lives in: House/apartment Stairs: Yes: Internal: 7 +7 steps; on left going up Has following equipment at home: Quad cane small base  PLOF: Independent; driving; Office manager, worked in [employer]  PATIENT GOALS: Improve writing; get into his truck easier like before  OBJECTIVE: (from evaluation unless otherwise noted)  HAND  DOMINANCE: Right  ADLs: Overall ADLs: mod I IADLs: Community mobility: driving currently Handwriting: 25% legible; writing gets sloppier with time  MOBILITY STATUS: Independent  ACTIVITY TOLERANCE: Activity tolerance: fair  FUNCTIONAL OUTCOME MEASURES: FOTO: Slight: 67  UPPER EXTREMITY ROM:     AROM Right (eval) Left (eval)  Shoulder flexion WNL WNL  Shoulder abduction WNL WNL  Elbow flexion WNL WNL  Elbow extension WNL WNL  Wrist flexion WNL WNL  Wrist extension WNL WNL  Wrist pronation WNL WNL  Wrist supination WNL WNL   Digit Composite Flexion WNL WNL  Digit Composite Extension WNL WNL  Digit Opposition WNL WNL  (Blank rows = not tested)  UPPER EXTREMITY MMT:     MMT Right (eval) Left (eval)  Shoulder flexion [facility] WNL  Shoulder abduction WFL WNL  Elbow flexion WFL WNL  Elbow extension WFL WNL  (Blank rows = not tested)  HAND FUNCTION: Grip strength: Right: 77.1 lbs; Left: 77.6 lbs  COORDINATION: 9 Hole Peg test: Right: 37 sec; Left: 24 sec  11/01/22 Right hand - 26.93 seconds  SENSATION: Reports paresthesias in RUE from shoulder distally  EDEMA: Reports mild to moderate swelling especially at night; no observed edema at eval.   MUSCLE TONE: WFL  COGNITION: Overall cognitive status: Within functional limits for tasks assessed  VISION: Subjective report: He went to the optometrist last month; reports no change; no double vision Baseline vision: Wears glasses all the time and Progressive lenses Visual history:  n/a  OBSERVATIONS: 11/01/22 - Pt ambulates without LOB with and without cane for short distances in the gym. Glasses donned.    TODAY'S TREATMENT:                                  Pt reports no difference with kinesiotape.   Reviewed previously issued shoulder HEP - Pt demo each x 10 reps w/ min cues.  Added stretches in supine for trunk stretch and rotation including: towel stretch and spinal twist - see pt instructions for details. Pt  performed each as indicated  Pt struggles to don/doff jacket - pt shown cape methods w/ cues for wide stance, large movement, punch arms in, and to turn to doff off shoulders. Pt did well with this method.  Pt also shown more efficient way to open twist lids/jars w/ big movement each turn.   Pt holding large ball in standing with arms extended to perform bilateral trunk rotation x 10 reps, followed by diagonal patterns bilaterally x 10 reps.   PATIENT EDUCATION: Education details: spinal/trunk stretches Person educated: Patient Education method: Programmer, multimedia, Facilities manager, Verbal cues, and Handouts Education comprehension: verbalized understanding, returned demonstration, and verbal cues required  HOME EXERCISE PROGRAM:  10/18/22: coordination and putty HEP 10/25/22: bed positioning, scapula HEP, Sh flex HEP 10/29/22: Reviewed bed positioning and UE ROM including with use of cane 11/01/22: Reviewed UE ROM with cane (will need handouts next visit) 11/07/22: Shoulder HEP  11/13/22: spinal stretch/twist  GOALS:  SHORT TERM GOALS: Target  date: 11/08/2022    Patient will demonstrate independence with initial RUE HEP. Baseline: Goal status: MET  2.  Pt will report no difficulty with combing or brushing hair. Baseline: a little difficulty Goal status: MET  3.  Pt will report ability to place dish in overhead cabinet with no difficulty Baseline: a little difficulty Goal status: MET   LONG TERM GOALS: Target date: 12/20/2022    Patient will demonstrate updated RUE HEP with 25% verbal cues or less for proper execution. Baseline:  Goal status: INITIAL  2.  Patient will complete nine-hole peg with use of R in 30 seconds or less. Baseline: 37 sec Goal status: MET  3.  Pt will report handwriting is at least 75% of prior level. Baseline: 25% Goal status: MET in print (cues to slow down, aim big, formulate each letter)  4.  Pt will complete d/c FOTO Baseline:  Goal status:  INITIAL  ASSESSMENT:  CLINICAL IMPRESSION: Pt with continued stiffness in Rt shoulder (but also in Lt shoulder) and reports of stiffness bilateral shoulders and trunk. Remains good candidate for skilled OT services to maximize independence and safety with ADLs and IADLs.   PERFORMANCE DEFICITS: in functional skills including ADLs, IADLs, coordination, sensation, edema, strength, pain, Fine motor control, and UE functional use.   IMPAIRMENTS: are limiting patient from ADLs, IADLs, rest and sleep, leisure, and social participation.   CO-MORBIDITIES: may have co-morbidities  that affects occupational performance. Patient will benefit from skilled OT to address above impairments and improve overall function.  REHAB POTENTIAL: Excellent  PLAN:  OT FREQUENCY: 2x/week  OT DURATION: up to 10 weeks as needed   PLANNED INTERVENTIONS: self care/ADL training, therapeutic exercise, therapeutic activity, neuromuscular re-education, ultrasound, paraffin, fluidotherapy, moist heat, contrast bath, patient/family education, visual/perceptual remediation/compensation, energy conservation, DME and/or AE instructions, and Re-evaluation  RECOMMENDED OTHER SERVICES: None at this time  CONSULTED AND AGREED WITH PLAN OF CARE: Patient  PLAN FOR NEXT SESSION: review stretches, reinforce strategies for jacket and write down strategies for donning/doffing jacket (cape method), continue trunk rotation/diagonal patterns   Sheran Lawless, OT 11/13/2022, 10:16 AM

## 2022-11-13 NOTE — Patient Instructions (Signed)
Lumbar Rotation: Caudal - Bilateral (Supine)    Feet and knees together, arms outstretched, rotate knees left, turning head in opposite direction, until stretch is felt. Hold __10__ seconds. Relax. Repeat to other side x 10 sec Repeat __3-5__ times each side. Do _2___ sessions per day.   2. Place towel roll vertical along spine while laying down on firm surface (firm bed or floor) and arms resting at 45* angle, palms up. One pillow under head. Stay there for 5-10 minutes, 1x/day

## 2022-11-15 ENCOUNTER — Encounter: Payer: Self-pay | Admitting: Occupational Therapy

## 2022-11-15 ENCOUNTER — Ambulatory Visit: Payer: Medicare Other | Admitting: Occupational Therapy

## 2022-11-15 DIAGNOSIS — I69351 Hemiplegia and hemiparesis following cerebral infarction affecting right dominant side: Secondary | ICD-10-CM

## 2022-11-15 DIAGNOSIS — M6281 Muscle weakness (generalized): Secondary | ICD-10-CM

## 2022-11-15 DIAGNOSIS — R2681 Unsteadiness on feet: Secondary | ICD-10-CM

## 2022-11-15 DIAGNOSIS — R278 Other lack of coordination: Secondary | ICD-10-CM

## 2022-11-15 NOTE — Therapy (Signed)
OUTPATIENT OCCUPATIONAL THERAPY NEURO TREATMENT   Patient Name: Jerome Cardenas MRN: [id number] DOB:1950/12/29, 72 y.o., male Today's Date: 11/15/2022  PCP: Dr. Michelle Nasuti in [place]  REFERRING PROVIDER: Butch Penny, NP  END OF SESSION:  OT End of Session - 11/15/22 0934     Visit Number 9    Number of Visits 21    Date for OT Re-Evaluation 12/20/22    Authorization Type Medicare    Progress Note Due on Visit 10    OT Start Time 0930    OT Stop Time 1015    OT Time Calculation (min) 45 min    Activity Tolerance Patient tolerated treatment well    Behavior During Therapy [facility] for tasks assessed/performed            Past Medical History:  Diagnosis Date   Cancer (HCC)    prostate   CKD (chronic kidney disease) stage 2, GFR 60-89 ml/min 07/28/2022   HLD (hyperlipidemia)    Hypertension    Past Surgical History:  Procedure Laterality Date   APPENDECTOMY     CHOLECYSTECTOMY     HERNIA REPAIR     PROSTATE SURGERY     prostate cancer 2014   Patient Active Problem List   Diagnosis Date Noted   Diabetes mellitus type 2 with neurological manifestations (HCC) 07/29/2022   Hypertension associated with diabetes (HCC) 07/29/2022   Hyperlipidemia associated with type 2 diabetes mellitus (HCC) 07/29/2022   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    ONSET DATE: 07/26/2022  REFERRING DIAG: T01.601 (ICD-10-CM) - Hemiplegia and hemiparesis following cerebral infarction affecting right dominant side   THERAPY DIAG:  Hemiplegia and hemiparesis following cerebral infarction affecting right dominant side (HCC)  Muscle weakness (generalized)  Unsteadiness on feet  Other lack of coordination  Rationale for Evaluation and Treatment: Rehabilitation  SUBJECTIVE:   SUBJECTIVE STATEMENT:  I fell 2 Sunday ago trying to get up from the floor when playing with the dog. My entire Rt side hurts and bottoms of both feet hurt  Pt accompanied by: self  PERTINENT HISTORY: "Jerome Cardenas is a 72 y.o. male presenting with left thalamic stroke outside of TPA window. High risk due to current malignancy and HLD. PMH significant for CKD stage II, HTN, GERD, vitamin D deficiency, hyperlipidemia, BPH, metastatic prostate cancer s/p radiation therapy.    Patient reported symptoms of right arm and leg weakness with sensation changes starting 1/18.  Presented to the hospital 1/20 after he developed right facial tingling that morning.  Patient was outside of tPA window.  CT head showed only chronic small vessel ischemia, no bleeding.  MRI head with acute left thalamic capsular infarct.  CTA demonstrates short segment occlusion of left PCA P3 segment and chronic ischemic microangiopathy with no large vessel occlusion.  Echo with LVEF 60-65%. No RWA. Mild LVH. G1DD.    Neurology consulted, recommended routine at risk stratification.  Was found to have A1c of 6.6 and lipids significant for triglycerides 371, HDL 23, and VLDL 74.  TSH, CBC, and BMP unremarkable aside from elevated glucose to 200."  evaluation for RUE weakness and difficulty performing ADLs   PRECAUTIONS: Fall  WEIGHT BEARING RESTRICTIONS: No  PAIN:  Are you having pain? Yes: NPRS scale: 5/10 today Pain location: Rt side Pain description: aching and numbness Aggravating factors: movement Relieving factors: nothing  FALLS: Has patient fallen in last 6 months? No  LIVING ENVIRONMENT: Lives with: lives with their spouse Lives in: House/apartment Stairs: Yes: Internal: 7 +  7 steps; on left going up Has following equipment at home: Quad cane small base  PLOF: Independent; driving; Office manager, worked in Office manager  PATIENT GOALS: Improve writing; get into his truck easier like before  OBJECTIVE: (from evaluation unless otherwise noted)  HAND DOMINANCE: Right  ADLs: Overall ADLs: mod I IADLs: Community mobility: driving currently Handwriting: 25% legible; writing gets sloppier with time  MOBILITY STATUS:  Independent  ACTIVITY TOLERANCE: Activity tolerance: fair  FUNCTIONAL OUTCOME MEASURES: FOTO: Slight: 67  UPPER EXTREMITY ROM:     AROM Right (eval) Left (eval)  Shoulder flexion WNL WNL  Shoulder abduction WNL WNL  Elbow flexion WNL WNL  Elbow extension WNL WNL  Wrist flexion WNL WNL  Wrist extension WNL WNL  Wrist pronation WNL WNL  Wrist supination WNL WNL   Digit Composite Flexion WNL WNL  Digit Composite Extension WNL WNL  Digit Opposition WNL WNL  (Blank rows = not tested)  UPPER EXTREMITY MMT:     MMT Right (eval) Left (eval)  Shoulder flexion [facility] WNL  Shoulder abduction WFL WNL  Elbow flexion WFL WNL  Elbow extension WFL WNL  (Blank rows = not tested)  HAND FUNCTION: Grip strength: Right: 77.1 lbs; Left: 77.6 lbs  COORDINATION: 9 Hole Peg test: Right: 37 sec; Left: 24 sec  11/01/22 Right hand - 26.93 seconds  SENSATION: Reports paresthesias in RUE from shoulder distally  EDEMA: Reports mild to moderate swelling especially at night; no observed edema at eval.   MUSCLE TONE: WFL  COGNITION: Overall cognitive status: Within functional limits for tasks assessed  VISION: Subjective report: He went to the optometrist last month; reports no change; no double vision Baseline vision: Wears glasses all the time and Progressive lenses Visual history:  n/a  OBSERVATIONS: 11/01/22 - Pt ambulates without LOB with and without cane for short distances in the gym. Glasses donned.    TODAY'S TREATMENT:                                  Pt reports he doesn't sleep well - for years.   Verbally reviewed stretches supine (issued last session)   Reviewed strategies for donning/doffing jacket cape method technique w/ cues for wide stance, large movement, punch arms in, and to turn to doff off shoulders. Pt did well with this method. Pt provided handout on this today  Pt holding large ball in standing with arms extended to perform bilateral trunk rotation x 10  reps, followed by diagonal patterns bilaterally x 10 reps.  Standing over table - performed modified PWR! Twist over table 5 reps each way for trunk rotation, high level reaching w/ cues to move big and to look at hands by turning head more and following hand with eyes Wall push ups x 10 for scapula strengthening. Standing at corner - worked on cross body reaching in high level diagonal pattern reaching with wt shifts LE's for BUE reaching, trunk rotation, and dynamic standing  UBE x 8 min, level 3 for normal reciprocal movement pattern and UB conditioning, keeping RPM >35   No increase in pain t/o session  PATIENT EDUCATION: Education details: Biomedical scientist for donning/doffing jacket Person educated: Patient Education method: Programmer, multimedia, Demonstration, Verbal cues, and Handouts Education comprehension: verbalized understanding, returned demonstration, and verbal cues required  HOME EXERCISE PROGRAM:  10/18/22: coordination and putty HEP 10/25/22: bed positioning, scapula HEP, Sh flex HEP 10/29/22: Reviewed bed positioning and UE  ROM including with use of cane 11/01/22: Reviewed UE ROM with cane (will need handouts next visit) 11/07/22: Shoulder HEP  11/13/22: spinal stretch/twist 11/15/22: cape method techniques for donning/doffing jacket  GOALS:  SHORT TERM GOALS: Target date: 11/08/2022    Patient will demonstrate independence with initial RUE HEP. Baseline: Goal status: MET  2.  Pt will report no difficulty with combing or brushing hair. Baseline: a little difficulty Goal status: MET  3.  Pt will report ability to place dish in overhead cabinet with no difficulty Baseline: a little difficulty Goal status: MET   LONG TERM GOALS: Target date: 12/20/2022    Patient will demonstrate updated RUE HEP with 25% verbal cues or less for proper execution. Baseline:  Goal status: INITIAL  2.  Patient will complete nine-hole peg with use of R in 30 seconds or less. Baseline: 37  sec Goal status: MET  3.  Pt will report handwriting is at least 75% of prior level. Baseline: 25% Goal status: MET in print (cues to slow down, aim big, formulate each letter)  4.  Pt will complete d/c FOTO Baseline:  Goal status: INITIAL  ASSESSMENT:  CLINICAL IMPRESSION: Pt with continued stiffness in Rt shoulder (but also in Lt shoulder) and reports of stiffness bilateral shoulders and trunk. Remains good candidate for skilled OT services to maximize independence and safety with ADLs and IADLs and improve RUE function  PERFORMANCE DEFICITS: in functional skills including ADLs, IADLs, coordination, sensation, edema, strength, pain, Fine motor control, and UE functional use.   IMPAIRMENTS: are limiting patient from ADLs, IADLs, rest and sleep, leisure, and social participation.   CO-MORBIDITIES: may have co-morbidities  that affects occupational performance. Patient will benefit from skilled OT to address above impairments and improve overall function.  REHAB POTENTIAL: Excellent  PLAN:  OT FREQUENCY: 2x/week  OT DURATION: up to 10 weeks as needed   PLANNED INTERVENTIONS: self care/ADL training, therapeutic exercise, therapeutic activity, neuromuscular re-education, ultrasound, paraffin, fluidotherapy, moist heat, contrast bath, patient/family education, visual/perceptual remediation/compensation, energy conservation, DME and/or AE instructions, and Re-evaluation  RECOMMENDED OTHER SERVICES: None at this time  CONSULTED AND AGREED WITH PLAN OF CARE: Patient  PLAN FOR NEXT SESSION: 10th progress note, progress towards remaining goals, RUE reaching/coordination task   Sheran Lawless, OT 11/15/2022, 9:35 AM

## 2022-11-15 NOTE — Patient Instructions (Signed)
To DON jacket:   1) Wide stance 2) hold facing tag out with hands big at sleeves 3) Swing around big like a cape to Rt side 4) Punch Rt arm in as you continue to pull behind you with Lt hand to Lt side 5) Punch Lt arm in   To DOFF jacket:   1) Wide stance 2) Hold big hands at belly button level 3) Turn and twist to Lt to take off Lt shoulder 4) Turn and twist to Rt to take off Rt shoulder 5) Reach big behind you and grab cuff of sleeve BIG and yank off one hand, then then ot

## 2022-11-20 ENCOUNTER — Encounter: Payer: Self-pay | Admitting: Occupational Therapy

## 2022-11-20 ENCOUNTER — Ambulatory Visit: Payer: Medicare Other | Admitting: Occupational Therapy

## 2022-11-20 DIAGNOSIS — R278 Other lack of coordination: Secondary | ICD-10-CM

## 2022-11-20 DIAGNOSIS — R2681 Unsteadiness on feet: Secondary | ICD-10-CM

## 2022-11-20 DIAGNOSIS — I69351 Hemiplegia and hemiparesis following cerebral infarction affecting right dominant side: Secondary | ICD-10-CM

## 2022-11-20 DIAGNOSIS — M6281 Muscle weakness (generalized): Secondary | ICD-10-CM

## 2022-11-20 NOTE — Therapy (Signed)
OUTPATIENT OCCUPATIONAL THERAPY NEURO TREATMENT   Patient Name: Jerome Cardenas MRN: [id number] DOB:09-14-1950, 72 y.o., male Today's Date: 11/20/2022  PCP: Dr. Michelle Nasuti in [place]  REFERRING PROVIDER: Butch Penny, NP  END OF SESSION:  OT End of Session - 11/20/22 0850     Visit Number 10    Number of Visits 21    Date for OT Re-Evaluation 12/20/22    Authorization Type Medicare    Progress Note Due on Visit 10    OT Start Time 0848    OT Stop Time 0930    OT Time Calculation (min) 42 min    Activity Tolerance Patient tolerated treatment well    Behavior During Therapy [facility] for tasks assessed/performed            Past Medical History:  Diagnosis Date   Cancer (HCC)    prostate   CKD (chronic kidney disease) stage 2, GFR 60-89 ml/min 07/28/2022   HLD (hyperlipidemia)    Hypertension    Past Surgical History:  Procedure Laterality Date   APPENDECTOMY     CHOLECYSTECTOMY     HERNIA REPAIR     PROSTATE SURGERY     prostate cancer 2014   Patient Active Problem List   Diagnosis Date Noted   Diabetes mellitus type 2 with neurological manifestations (HCC) 07/29/2022   Hypertension associated with diabetes (HCC) 07/29/2022   Hyperlipidemia associated with type 2 diabetes mellitus (HCC) 07/29/2022   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    ONSET DATE: 07/26/2022  REFERRING DIAG: W09.811 (ICD-10-CM) - Hemiplegia and hemiparesis following cerebral infarction affecting right dominant side   THERAPY DIAG:  Hemiplegia and hemiparesis following cerebral infarction affecting right dominant side (HCC)  Muscle weakness (generalized)  Unsteadiness on feet  Other lack of coordination  Rationale for Evaluation and Treatment: Rehabilitation  SUBJECTIVE:   SUBJECTIVE STATEMENT: I don't have pain now, but I do at night on whichever side I'm sleeping on it. (More on Rt side, but some on Lt side)   Pt accompanied by: self  PERTINENT HISTORY: "Jerome Cardenas is a  72 y.o. male presenting with left thalamic stroke outside of TPA window. High risk due to current malignancy and HLD. PMH significant for CKD stage II, HTN, GERD, vitamin D deficiency, hyperlipidemia, BPH, metastatic prostate cancer s/p radiation therapy.    Patient reported symptoms of right arm and leg weakness with sensation changes starting 1/18.  Presented to the hospital 1/20 after he developed right facial tingling that morning.  Patient was outside of tPA window.  CT head showed only chronic small vessel ischemia, no bleeding.  MRI head with acute left thalamic capsular infarct.  CTA demonstrates short segment occlusion of left PCA P3 segment and chronic ischemic microangiopathy with no large vessel occlusion.  Echo with LVEF 60-65%. No RWA. Mild LVH. G1DD.    Neurology consulted, recommended routine at risk stratification.  Was found to have A1c of 6.6 and lipids significant for triglycerides 371, HDL 23, and VLDL 74.  TSH, CBC, and BMP unremarkable aside from elevated glucose to 200."  evaluation for RUE weakness and difficulty performing ADLs   PRECAUTIONS: Fall  WEIGHT BEARING RESTRICTIONS: No  PAIN:  Are you having pain? Yes: NPRS scale: 0/10 today Pain location: Rt side Pain description: aching and numbness Aggravating factors: movement Relieving factors: nothing  FALLS: Has patient fallen in last 6 months? No  LIVING ENVIRONMENT: Lives with: lives with their spouse Lives in: House/apartment Stairs: Yes: Internal: 7 +7 steps;  on left going up Has following equipment at home: Quad cane small base  PLOF: Independent; driving; Office manager, worked in Office manager  PATIENT GOALS: Improve writing; get into his truck easier like before  OBJECTIVE: (from evaluation unless otherwise noted)  HAND DOMINANCE: Right  ADLs: Overall ADLs: mod I IADLs: Community mobility: driving currently Handwriting: 25% legible; writing gets sloppier with time  MOBILITY STATUS:  Independent  ACTIVITY TOLERANCE: Activity tolerance: fair  FUNCTIONAL OUTCOME MEASURES: FOTO: Slight: 67  UPPER EXTREMITY ROM:     AROM Right (eval) Left (eval)  Shoulder flexion WNL WNL  Shoulder abduction WNL WNL  Elbow flexion WNL WNL  Elbow extension WNL WNL  Wrist flexion WNL WNL  Wrist extension WNL WNL  Wrist pronation WNL WNL  Wrist supination WNL WNL   Digit Composite Flexion WNL WNL  Digit Composite Extension WNL WNL  Digit Opposition WNL WNL  (Blank rows = not tested)  UPPER EXTREMITY MMT:     MMT Right (eval) Left (eval)  Shoulder flexion [facility] WNL  Shoulder abduction WFL WNL  Elbow flexion WFL WNL  Elbow extension WFL WNL  (Blank rows = not tested)  HAND FUNCTION: Grip strength: Right: 77.1 lbs; Left: 77.6 lbs  COORDINATION: 9 Hole Peg test: Right: 37 sec; Left: 24 sec  11/01/22 Right hand - 26.93 seconds  SENSATION: Reports paresthesias in RUE from shoulder distally  EDEMA: Reports mild to moderate swelling especially at night; no observed edema at eval.   MUSCLE TONE: WFL  COGNITION: Overall cognitive status: Within functional limits for tasks assessed  VISION: Subjective report: He went to the optometrist last month; reports no change; no double vision Baseline vision: Wears glasses all the time and Progressive lenses Visual history:  n/a  OBSERVATIONS: 11/01/22 - Pt ambulates without LOB with and without cane for short distances in the gym. Glasses donned.    TODAY'S TREATMENT:                                  Pt reports he doesn't sleep well - for years. Now interrupted by pain - encouraged pt to sleep on back if able and discuss further with MD if pain persists for medical management  Standing w/ functional reaching to place small pegs in pegboard on vertical surface requiring wt shifts for balance, coordination of hands - bilaterally.  Pt then removing 3 pegs 1 at a time in palm, then moving from palm to fingertips 1 at a time for  in hand manipulation/translation Rt hand, with min difficulty, min drops.    UBE x 3 min, level 5 for normal reciprocal movement pattern and UB conditioning    PATIENT EDUCATION: Education details: cape method technique for donning/doffing jacket Person educated: Patient Education method: Programmer, multimedia, Demonstration, Verbal cues, and Handouts Education comprehension: verbalized understanding, returned demonstration, and verbal cues required  HOME EXERCISE PROGRAM:  10/18/22: coordination and putty HEP 10/25/22: bed positioning, scapula HEP, Sh flex HEP 10/29/22: Reviewed bed positioning and UE ROM including with use of cane 11/01/22: Reviewed UE ROM with cane (will need handouts next visit) 11/07/22: Shoulder HEP  11/13/22: spinal stretch/twist 11/15/22: cape method techniques for donning/doffing jacket  GOALS:  SHORT TERM GOALS: Target date: 11/08/2022    Patient will demonstrate independence with initial RUE HEP. Baseline: Goal status: MET  2.  Pt will report no difficulty with combing or brushing hair. Baseline: a little difficulty Goal status: MET  3.  Pt will report ability to place dish in overhead cabinet with no difficulty Baseline: a little difficulty Goal status: MET   LONG TERM GOALS: Target date: 12/20/2022    Patient will demonstrate updated RUE HEP with 25% verbal cues or less for proper execution. Baseline:  Goal status: INITIAL  2.  Patient will complete nine-hole peg with use of R in 30 seconds or less. Baseline: 37 sec Goal status: MET  3.  Pt will report handwriting is at least 75% of prior level. Baseline: 25% Goal status: MET in print (cues to slow down, aim big, formulate each letter)  4.  Pt will complete d/c FOTO Baseline:  Goal status: INITIAL  ASSESSMENT:  CLINICAL IMPRESSION: This 10th progress note is for dates 10/11/22 to 11/20/22: Pt has met all STG's and 2/4 LTG's at this time. Pt has improved with RUE function, limited by pain and difficulty  sleeping at night.   PERFORMANCE DEFICITS: in functional skills including ADLs, IADLs, coordination, sensation, edema, strength, pain, Fine motor control, and UE functional use.   IMPAIRMENTS: are limiting patient from ADLs, IADLs, rest and sleep, leisure, and social participation.   CO-MORBIDITIES: may have co-morbidities  that affects occupational performance. Patient will benefit from skilled OT to address above impairments and improve overall function.  REHAB POTENTIAL: Excellent  PLAN:  OT FREQUENCY: 2x/week  OT DURATION: up to 10 weeks as needed   PLANNED INTERVENTIONS: self care/ADL training, therapeutic exercise, therapeutic activity, neuromuscular re-education, ultrasound, paraffin, fluidotherapy, moist heat, contrast bath, patient/family education, visual/perceptual remediation/compensation, energy conservation, DME and/or AE instructions, and Re-evaluation  RECOMMENDED OTHER SERVICES: None at this time  CONSULTED AND AGREED WITH PLAN OF CARE: Patient  PLAN FOR NEXT SESSION: issue low range theraband HEP w/ focus on posterior shoulder girdle strengthening (rows, sh ext, horizontal abd), dynamic standing.  Anticipate d/c next week   Sheran Lawless, OT 11/20/2022, 8:50 AM

## 2022-11-21 ENCOUNTER — Ambulatory Visit: Payer: Medicare Other | Admitting: Occupational Therapy

## 2022-11-21 ENCOUNTER — Ambulatory Visit: Payer: Medicare Other | Admitting: Physical Therapy

## 2022-11-21 ENCOUNTER — Encounter: Payer: Self-pay | Admitting: Occupational Therapy

## 2022-11-21 VITALS — BP 173/86 | HR 73

## 2022-11-21 DIAGNOSIS — I69351 Hemiplegia and hemiparesis following cerebral infarction affecting right dominant side: Secondary | ICD-10-CM | POA: Diagnosis not present

## 2022-11-21 DIAGNOSIS — R2681 Unsteadiness on feet: Secondary | ICD-10-CM

## 2022-11-21 DIAGNOSIS — M6281 Muscle weakness (generalized): Secondary | ICD-10-CM

## 2022-11-21 NOTE — Therapy (Signed)
OUTPATIENT PHYSICAL THERAPY NEURO TREATMENT   Patient Name: Jerome Cardenas MRN: [id number] DOB:10/31/1950, 72 y.o., male Today's Date: 11/21/2022  PCP: Dr. Michelle Nasuti in [place]  REFERRING PROVIDER: Butch Penny, NP      END OF SESSION:  PT End of Session - 11/21/22 0932     Visit Number 19   Arrived no charge   Number of Visits 27    Date for PT Re-Evaluation 12/06/22    Authorization Type Medicare    Progress Note Due on Visit 10    PT Start Time 0930    PT Stop Time 0945   Arrived no charge   PT Time Calculation (min) 15 min    Equipment Utilized During Treatment --    Activity Tolerance Treatment limited secondary to medical complications (Comment)   HTN   Behavior During Therapy [facility] for tasks assessed/performed;Flat affect                 Past Medical History:  Diagnosis Date   Cancer (HCC)    prostate   CKD (chronic kidney disease) stage 2, GFR 60-89 ml/min 07/28/2022   HLD (hyperlipidemia)    Hypertension    Past Surgical History:  Procedure Laterality Date   APPENDECTOMY     CHOLECYSTECTOMY     HERNIA REPAIR     PROSTATE SURGERY     prostate cancer 2014   Patient Active Problem List   Diagnosis Date Noted   Diabetes mellitus type 2 with neurological manifestations (HCC) 07/29/2022   Hypertension associated with diabetes (HCC) 07/29/2022   Hyperlipidemia associated with type 2 diabetes mellitus (HCC) 07/29/2022   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    ONSET DATE: 08/29/2022 (referral)  REFERRING DIAG: W09.811,B14.7 (ICD-10-CM) - Abnormality of gait as late effect of stroke  THERAPY DIAG:  Muscle weakness (generalized)  Hemiplegia and hemiparesis following cerebral infarction affecting right dominant side (HCC)  Unsteadiness on feet  Rationale for Evaluation and Treatment: Rehabilitation  SUBJECTIVE:                                                                                                                                                                                              SUBJECTIVE STATEMENT: Pt reports he has "good days and bad days". No falls since session. Did not feel relief with the dry needling, still has a bruise on his R upper trap.    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 No  VITALS Vitals:  11/21/22 0935 11/21/22 0940  BP: (!) 175/86 (!) 173/86  Pulse: 72 73     PRECAUTIONS: Fall  WEIGHT BEARING RESTRICTIONS: No  FALLS: Has patient fallen in last 6 months? No  LIVING ENVIRONMENT: Lives with: lives with their spouse Lives in: House/apartment Stairs: Yes: Internal: 7 +7 steps; on left going up Has following equipment at home: Quad cane small base  PLOF: Independent  PATIENT GOALS: "mainly getting the movement back in my hand like it was"  OBJECTIVE:   DIAGNOSTIC FINDINGS: MRI of brain on 07/28/22 IMPRESSION: Acute left thalamocapsular infarct.  CT of head/neck on 07/29/22 IMPRESSION: 1. Short segment occlusion of the left PCA P3 segment. 2. No proximal large vessel occlusion. 3. Chronic ischemic microangiopathy.  COGNITION: Overall cognitive status: Within functional limits for tasks assessed   SENSATION: Pt reports constant numbness/tingling in R hemibody   COORDINATION: Heel to shin test: WFL on LLE, dysmetric on RLE   EDEMA: Pt reports new edema in distal RLE and RUE   POSTURE: rounded shoulders, forward head, posterior pelvic tilt, and weight shift left  LOWER EXTREMITY MMT:  Tested in Seated position   MMT Right Eval Left Eval  Hip flexion 3+ 4  Hip extension    Hip abduction 4 4  Hip adduction 3+ 4-  Hip internal rotation    Hip external rotation    Knee flexion 2 4  Knee extension 3+ 4  Ankle dorsiflexion 4+ 4+  Ankle plantarflexion    Ankle inversion    Ankle  eversion    (Blank rows = not tested)   TODAY'S TREATMENT:     Arrived no charge Assessed pt's BP (see above) and systolic BP too elevated to safely participate in PT this date. Pt verbalized understanding.  Pt inquiring about adding PT appointments in June, informed pt we would assess goals next session and then determine POC. Pt verbalized understanding.    PATIENT EDUCATION: Education details: Continue HEP Person educated: Patient Education method: Explanation Education comprehension: verbalized understanding  HOME EXERCISE PROGRAM: Access Code: [email or web address] URL: [email or web address] Date: 09/14/2022 Prepared by: Alethia Berthold Strider Vallance  Exercises - Feet together with eyes closed and head turns   - 1 x daily - 7 x weekly - 3-4 reps - 20-30 second hold - Tandem Stance in Corner  - 1 x daily - 7 x weekly - 3-4 reps - 15-30 second  hold - Seated Upper Trapezius Stretch  - 1 x daily - 7 x weekly - 1 sets - 5 reps - 30 sec hold - Gentle Levator Scapulae Stretch  - 1 x daily - 7 x weekly - 1 sets - 5 reps - 30 sec hold  GOALS: Goals reviewed with patient? Yes   NEW LONG TERM GOALS FOR UPDATED POC: Target date: 11/19/2022   Pt will be independent with final HEP for improved strength, balance, transfers and gait.  Baseline:  Goal status: IN PROGRESS  2.  Pt will improve 5 x STS to less than or equal to 13 seconds without UE support to demonstrate improved functional strength and transfer efficiency.   Baseline: 16.62s w/hands braced on knees; 16.15s w/o UE support (4/11) Goal status: IN PROGRESS  3.  Pt will improve gait velocity to at least 2.8 ft/s w/LRAD for improved gait efficiency and reduced recurrent fall risk   Baseline: 1.27 ft/s without AD; 2.6 ft/s without AD (3/27); 2.74 ft/s no AD (4/11) Goal status: IN PROGRESS  4.  Pt will improve Berg score to 52/56 for  decreased fall risk  Baseline: 49/56; improved to 53/56 Goal status: MET  5.  Pt will improve FOTO  to 61 for improved subjective functional ability   Baseline: 47; 60 (3/27); improved to 72 on 10/22/2022  Goal Status: MET  6.  Patient will improve Mini-BEST score to 21/28 or greater to indicate a decreased risk of falls and improved static and dynamic stability to help increase patient's confidence in community/home environment.   Baseline: 17/28  Goal Status: Initial  ASSESSMENT:  CLINICAL IMPRESSION: Arrived no charge due to HTN.    OBJECTIVE IMPAIRMENTS: Abnormal gait, decreased activity tolerance, decreased balance, decreased coordination, decreased endurance, difficulty walking, decreased strength, impaired sensation, and pain  ACTIVITY LIMITATIONS: carrying, lifting, bending, squatting, stairs, transfers, bed mobility, self feeding, hygiene/grooming, locomotion level, and caring for others  PARTICIPATION LIMITATIONS: meal prep, cleaning, medication management, interpersonal relationship, community activity, and yard work  PERSONAL FACTORS: Age, Fitness, Past/current experiences, and 1-2 comorbidities: HTN and CVA  are also affecting patient's functional outcome.   REHAB POTENTIAL: Good  CLINICAL DECISION MAKING: Stable/uncomplicated  EVALUATION COMPLEXITY: Low  PLAN:  PT FREQUENCY: 1-2x/week  PT DURATION: 6 weeks  PLANNED INTERVENTIONS: Therapeutic exercises, Therapeutic activity, Neuromuscular re-education, Balance training, Gait training, Patient/Family education, Self Care, Joint mobilization, Stair training, DME instructions, Dry Needling, Manual therapy, and Re-evaluation  PLAN FOR NEXT SESSION: 20th PN and goal assessment, Balance w/reduced vestibular input. RLE strength and single leg stability    Rosalynn Sergent E Blayze Haen, PT, DPT 11/21/2022, 9:50 AM

## 2022-11-21 NOTE — Patient Instructions (Signed)
(  Clinic) Retraction: Row - Bilateral (Pulley)    Facing pulley, arms reaching forward, pull hands toward stomach, pinching shoulder blades together. Repeat _10___ times per set.  Do __1-2__ sessions per day    Strengthening: Resisted Extension   Hold tubing in _BOTH____ hand(s), arm forward. Pull arm back, elbow straight. Repeat _10___ times per set. Do _1-2___ sessions per day.   Strengthening: Resisted Flexion   Hold tubing with __Rt___ arm(s) at side. Pull forward and up. Move shoulder through pain-free range of motion. Repeat __10__ times per set.  Do _1-2_ sessions per day    Resisted Horizontal Abduction: Bilateral   Sit or stand, tubing in both hands, arms out in front. Keeping arms straight, pinch shoulder blades together and stretch arms out. Repeat _10___ times per set. Do _1-2___ sessions per day

## 2022-11-21 NOTE — Therapy (Signed)
OUTPATIENT OCCUPATIONAL THERAPY NEURO TREATMENT   Patient Name: Jerome Cardenas MRN: [id number] DOB:Jun 20, 1951, 72 y.o., male Today's Date: 11/21/2022  PCP: Dr. Michelle Nasuti in [place]  REFERRING PROVIDER: Butch Penny, NP  END OF SESSION:  OT End of Session - 11/21/22 0948     Visit Number 11    Number of Visits 21    Date for OT Re-Evaluation 12/20/22    Authorization Type Medicare    Progress Note Due on Visit 20    OT Start Time 0945    OT Stop Time 1015    OT Time Calculation (min) 30 min    Activity Tolerance Patient tolerated treatment well    Behavior During Therapy [facility] for tasks assessed/performed            Past Medical History:  Diagnosis Date   Cancer (HCC)    prostate   CKD (chronic kidney disease) stage 2, GFR 60-89 ml/min 07/28/2022   HLD (hyperlipidemia)    Hypertension    Past Surgical History:  Procedure Laterality Date   APPENDECTOMY     CHOLECYSTECTOMY     HERNIA REPAIR     PROSTATE SURGERY     prostate cancer 2014   Patient Active Problem List   Diagnosis Date Noted   Diabetes mellitus type 2 with neurological manifestations (HCC) 07/29/2022   Hypertension associated with diabetes (HCC) 07/29/2022   Hyperlipidemia associated with type 2 diabetes mellitus (HCC) 07/29/2022   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    ONSET DATE: 07/26/2022  REFERRING DIAG: N82.956 (ICD-10-CM) - Hemiplegia and hemiparesis following cerebral infarction affecting right dominant side   THERAPY DIAG:  Hemiplegia and hemiparesis following cerebral infarction affecting right dominant side (HCC)  Muscle weakness (generalized)  Rationale for Evaluation and Treatment: Rehabilitation  SUBJECTIVE:   SUBJECTIVE STATEMENT: I don't have pain now, but last night was 10/10 pain Rt elbow Pt accompanied by: self  PERTINENT HISTORY: "Jerome Cardenas is a 72 y.o. male presenting with left thalamic stroke outside of TPA window. High risk due to current malignancy and  HLD. PMH significant for CKD stage II, HTN, GERD, vitamin D deficiency, hyperlipidemia, BPH, metastatic prostate cancer s/p radiation therapy.    Patient reported symptoms of right arm and leg weakness with sensation changes starting 1/18.  Presented to the hospital 1/20 after he developed right facial tingling that morning.  Patient was outside of tPA window.  CT head showed only chronic small vessel ischemia, no bleeding.  MRI head with acute left thalamic capsular infarct.  CTA demonstrates short segment occlusion of left PCA P3 segment and chronic ischemic microangiopathy with no large vessel occlusion.  Echo with LVEF 60-65%. No RWA. Mild LVH. G1DD.    Neurology consulted, recommended routine at risk stratification.  Was found to have A1c of 6.6 and lipids significant for triglycerides 371, HDL 23, and VLDL 74.  TSH, CBC, and BMP unremarkable aside from elevated glucose to 200."  evaluation for RUE weakness and difficulty performing ADLs   PRECAUTIONS: Fall  WEIGHT BEARING RESTRICTIONS: No  PAIN:  Are you having pain? Yes: NPRS scale: 0/10 today Pain location: Rt side Pain description: aching and numbness Aggravating factors: movement Relieving factors: nothing  FALLS: Has patient fallen in last 6 months? No  LIVING ENVIRONMENT: Lives with: lives with their spouse Lives in: House/apartment Stairs: Yes: Internal: 7 +7 steps; on left going up Has following equipment at home: Quad cane small base  PLOF: Independent; driving; Office manager, worked in [employer]  PATIENT  GOALS: Improve writing; get into his truck easier like before  OBJECTIVE: (from evaluation unless otherwise noted)  HAND DOMINANCE: Right  ADLs: Overall ADLs: mod I IADLs: Community mobility: driving currently Handwriting: 25% legible; writing gets sloppier with time  MOBILITY STATUS: Independent  ACTIVITY TOLERANCE: Activity tolerance: fair  FUNCTIONAL OUTCOME MEASURES: FOTO: Slight: 67  UPPER EXTREMITY  ROM:     AROM Right (eval) Left (eval)  Shoulder flexion WNL WNL  Shoulder abduction WNL WNL  Elbow flexion WNL WNL  Elbow extension WNL WNL  Wrist flexion WNL WNL  Wrist extension WNL WNL  Wrist pronation WNL WNL  Wrist supination WNL WNL   Digit Composite Flexion WNL WNL  Digit Composite Extension WNL WNL  Digit Opposition WNL WNL  (Blank rows = not tested)  UPPER EXTREMITY MMT:     MMT Right (eval) Left (eval)  Shoulder flexion [facility] WNL  Shoulder abduction WFL WNL  Elbow flexion WFL WNL  Elbow extension WFL WNL  (Blank rows = not tested)  HAND FUNCTION: Grip strength: Right: 77.1 lbs; Left: 77.6 lbs  COORDINATION: 9 Hole Peg test: Right: 37 sec; Left: 24 sec  11/01/22 Right hand - 26.93 seconds  SENSATION: Reports paresthesias in RUE from shoulder distally  EDEMA: Reports mild to moderate swelling especially at night; no observed edema at eval.   MUSCLE TONE: WFL  COGNITION: Overall cognitive status: Within functional limits for tasks assessed  VISION: Subjective report: He went to the optometrist last month; reports no change; no double vision Baseline vision: Wears glasses all the time and Progressive lenses Visual history:  n/a  OBSERVATIONS: 11/01/22 - Pt ambulates without LOB with and without cane for short distances in the gym. Glasses donned.    TODAY'S TREATMENT:                                  Pt had HTN with previous P.T. session therefore only saw for seated activities today:  Pt issued theraband HEP - see pt instructions for details. Pt performed each x 10 reps w/ min cues.   Pt w/ tightness along biceps however denies tenderness with palpation - does not appear to be tendonitis. Pt reports pain near medial epicondyle however no tenderness at muscle attachments. Pt reports tightness along entire RUE - suspect d/t mild hypertonicity from CVA and ? Nerve pain d/t thalamic stroke. Recommended pt discuss further w/ MD at next visit and possible  gabapentin for nerve pain as well as adjusting BP meds d/t recent higher BP   PATIENT EDUCATION: Education details: Theraband HEP  Person educated: Patient Education method: Explanation, Demonstration, Verbal cues, and Handouts Education comprehension: verbalized understanding, returned demonstration, and verbal cues required  HOME EXERCISE PROGRAM:  10/18/22: coordination and putty HEP 10/25/22: bed positioning, scapula HEP, Sh flex HEP 10/29/22: Reviewed bed positioning and UE ROM including with use of cane 11/01/22: Reviewed UE ROM with cane (will need handouts next visit) 11/07/22: Shoulder HEP  11/13/22: spinal stretch/twist 11/15/22: cape method techniques for donning/doffing jacket 11/21/22: theraband HEP   GOALS:  SHORT TERM GOALS: Target date: 11/08/2022    Patient will demonstrate independence with initial RUE HEP. Baseline: Goal status: MET  2.  Pt will report no difficulty with combing or brushing hair. Baseline: a little difficulty Goal status: MET  3.  Pt will report ability to place dish in overhead cabinet with no difficulty Baseline: a little difficulty Goal status: MET  LONG TERM GOALS: Target date: 12/20/2022    Patient will demonstrate updated RUE HEP with 25% verbal cues or less for proper execution. Baseline:  Goal status: INITIAL  2.  Patient will complete nine-hole peg with use of R in 30 seconds or less. Baseline: 37 sec Goal status: MET  3.  Pt will report handwriting is at least 75% of prior level. Baseline: 25% Goal status: MET in print (cues to slow down, aim big, formulate each letter)  4.  Pt will complete d/c FOTO Baseline:  Goal status: INITIAL  ASSESSMENT:  CLINICAL IMPRESSION: Pt has met all STG's and 2/4 LTG's at this time. Pt has improved with RUE function, limited by pain and difficulty sleeping at night.   PERFORMANCE DEFICITS: in functional skills including ADLs, IADLs, coordination, sensation, edema, strength, pain, Fine motor  control, and UE functional use.   IMPAIRMENTS: are limiting patient from ADLs, IADLs, rest and sleep, leisure, and social participation.   CO-MORBIDITIES: may have co-morbidities  that affects occupational performance. Patient will benefit from skilled OT to address above impairments and improve overall function.  REHAB POTENTIAL: Excellent  PLAN:  OT FREQUENCY: 2x/week  OT DURATION: up to 10 weeks as needed   PLANNED INTERVENTIONS: self care/ADL training, therapeutic exercise, therapeutic activity, neuromuscular re-education, ultrasound, paraffin, fluidotherapy, moist heat, contrast bath, patient/family education, visual/perceptual remediation/compensation, energy conservation, DME and/or AE instructions, and Re-evaluation  RECOMMENDED OTHER SERVICES: None at this time  CONSULTED AND AGREED WITH PLAN OF CARE: Patient  PLAN FOR NEXT SESSION: Review theraband HEP, dynamic standing tasks if BP WFL's Anticipate d/c next week   Sheran Lawless, OT 11/21/2022, 9:50 AM

## 2022-11-27 ENCOUNTER — Encounter: Payer: Self-pay | Admitting: Occupational Therapy

## 2022-11-27 ENCOUNTER — Telehealth: Payer: Self-pay | Admitting: Adult Health

## 2022-11-27 ENCOUNTER — Ambulatory Visit: Payer: Medicare Other | Admitting: Occupational Therapy

## 2022-11-27 ENCOUNTER — Ambulatory Visit: Payer: Medicare Other | Admitting: Physical Therapy

## 2022-11-27 VITALS — BP 158/78 | HR 68

## 2022-11-27 DIAGNOSIS — I69351 Hemiplegia and hemiparesis following cerebral infarction affecting right dominant side: Secondary | ICD-10-CM

## 2022-11-27 DIAGNOSIS — M6281 Muscle weakness (generalized): Secondary | ICD-10-CM

## 2022-11-27 DIAGNOSIS — R2681 Unsteadiness on feet: Secondary | ICD-10-CM

## 2022-11-27 DIAGNOSIS — R278 Other lack of coordination: Secondary | ICD-10-CM

## 2022-11-27 NOTE — Telephone Encounter (Signed)
Patient wasn't to change to Gabapentin as discussed with Megan. He is currently taking baclofen and said it's not working and was interested in making the change. His pharmacy is still [facility].

## 2022-11-27 NOTE — Therapy (Signed)
OUTPATIENT OCCUPATIONAL THERAPY NEURO TREATMENT   Patient Name: Jerome Cardenas MRN: [id number] DOB:04/11/1951, 72 y.o., male Today's Date: 11/27/2022  PCP: Dr. Michelle Nasuti in [place]  REFERRING PROVIDER: Butch Penny, NP  END OF SESSION:  OT End of Session - 11/27/22 0933     Visit Number 12    Number of Visits 21    Date for OT Re-Evaluation 12/20/22    Authorization Type Medicare    Progress Note Due on Visit 20    OT Start Time 0930    OT Stop Time 1015    OT Time Calculation (min) 45 min    Activity Tolerance Patient tolerated treatment well    Behavior During Therapy [facility] for tasks assessed/performed            Past Medical History:  Diagnosis Date   Cancer (HCC)    prostate   CKD (chronic kidney disease) stage 2, GFR 60-89 ml/min 07/28/2022   HLD (hyperlipidemia)    Hypertension    Past Surgical History:  Procedure Laterality Date   APPENDECTOMY     CHOLECYSTECTOMY     HERNIA REPAIR     PROSTATE SURGERY     prostate cancer 2014   Patient Active Problem List   Diagnosis Date Noted   Diabetes mellitus type 2 with neurological manifestations (HCC) 07/29/2022   Hypertension associated with diabetes (HCC) 07/29/2022   Hyperlipidemia associated with type 2 diabetes mellitus (HCC) 07/29/2022   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    ONSET DATE: 07/26/2022  REFERRING DIAG: W09.811 (ICD-10-CM) - Hemiplegia and hemiparesis following cerebral infarction affecting right dominant side   THERAPY DIAG:  Hemiplegia and hemiparesis following cerebral infarction affecting right dominant side (HCC)  Unsteadiness on feet  Other lack of coordination  Muscle weakness (generalized)  Rationale for Evaluation and Treatment: Rehabilitation  SUBJECTIVE:   SUBJECTIVE STATEMENT: I don't have pain now, but last night was bad again. I only got about an hour sleep. No recent falls Pt accompanied by: self  PERTINENT HISTORY: "Jerome Cardenas is a 72 y.o. male  presenting with left thalamic stroke outside of TPA window. High risk due to current malignancy and HLD. PMH significant for CKD stage II, HTN, GERD, vitamin D deficiency, hyperlipidemia, BPH, metastatic prostate cancer s/p radiation therapy.    Patient reported symptoms of right arm and leg weakness with sensation changes starting 1/18.  Presented to the hospital 1/20 after he developed right facial tingling that morning.  Patient was outside of tPA window.  CT head showed only chronic small vessel ischemia, no bleeding.  MRI head with acute left thalamic capsular infarct.  CTA demonstrates short segment occlusion of left PCA P3 segment and chronic ischemic microangiopathy with no large vessel occlusion.  Echo with LVEF 60-65%. No RWA. Mild LVH. G1DD.    Neurology consulted, recommended routine at risk stratification.  Was found to have A1c of 6.6 and lipids significant for triglycerides 371, HDL 23, and VLDL 74.  TSH, CBC, and BMP unremarkable aside from elevated glucose to 200."  evaluation for RUE weakness and difficulty performing ADLs   PRECAUTIONS: Fall  WEIGHT BEARING RESTRICTIONS: No  PAIN:  Are you having pain? Yes: NPRS scale: 0/10 today Pain location: Rt side Pain description: aching and numbness Aggravating factors: movement Relieving factors: nothing  FALLS: Has patient fallen in last 6 months? No  LIVING ENVIRONMENT: Lives with: lives with their spouse Lives in: House/apartment Stairs: Yes: Internal: 7 +7 steps; on left going up Has following equipment  at home: Quad cane small base  PLOF: Independent; driving; Office manager, worked in [employer] manager  PATIENT GOALS: Improve writing; get into his truck easier like before  OBJECTIVE: (from evaluation unless otherwise noted)  HAND DOMINANCE: Right  ADLs: Overall ADLs: mod I IADLs: Community mobility: driving currently Handwriting: 25% legible; writing gets sloppier with time  MOBILITY STATUS: Independent  ACTIVITY  TOLERANCE: Activity tolerance: fair  FUNCTIONAL OUTCOME MEASURES: FOTO: Slight: 67  UPPER EXTREMITY ROM:     AROM Right (eval) Left (eval)  Shoulder flexion WNL WNL  Shoulder abduction WNL WNL  Elbow flexion WNL WNL  Elbow extension WNL WNL  Wrist flexion WNL WNL  Wrist extension WNL WNL  Wrist pronation WNL WNL  Wrist supination WNL WNL   Digit Composite Flexion WNL WNL  Digit Composite Extension WNL WNL  Digit Opposition WNL WNL  (Blank rows = not tested)  UPPER EXTREMITY MMT:     MMT Right (eval) Left (eval)  Shoulder flexion [facility] WNL  Shoulder abduction WFL WNL  Elbow flexion WFL WNL  Elbow extension WFL WNL  (Blank rows = not tested)  HAND FUNCTION: Grip strength: Right: 77.1 lbs; Left: 77.6 lbs  COORDINATION: 9 Hole Peg test: Right: 37 sec; Left: 24 sec  11/01/22 Right hand - 26.93 seconds  SENSATION: Reports paresthesias in RUE from shoulder distally  EDEMA: Reports mild to moderate swelling especially at night; no observed edema at eval.   MUSCLE TONE: WFL  COGNITION: Overall cognitive status: Within functional limits for tasks assessed  VISION: Subjective report: He went to the optometrist last month; reports no change; no double vision Baseline vision: Wears glasses all the time and Progressive lenses Visual history:  n/a  OBSERVATIONS: 11/01/22 - Pt ambulates without LOB with and without cane for short distances in the gym. Glasses donned.    TODAY'S TREATMENT:                                  BP = 161/89, HR = 67 Pt still not sleeping - recommended again discussing medically managed options for pain with MD (ex: gabapentin vs. Lyrica)   Reviewed theraband HEP today in standing at door - pt return demo of each x 10 w/ min cueing  UBE x 5 mintues, level 3 for UE strength/endurance w/ min cues for RUE to prevent compensations BP increased to 182/88 after above ex's. Allowed to rest 5 minutes then retook BP = 150/88    PATIENT  EDUCATION: Education details: Theraband HEP  Person educated: Patient Education method: Explanation, Demonstration, Verbal cues, and Handouts Education comprehension: verbalized understanding, returned demonstration, and verbal cues required  HOME EXERCISE PROGRAM:  10/18/22: coordination and putty HEP 10/25/22: bed positioning, scapula HEP, Sh flex HEP 10/29/22: Reviewed bed positioning and UE ROM including with use of cane 11/01/22: Reviewed UE ROM with cane (will need handouts next visit) 11/07/22: Shoulder HEP  11/13/22: spinal stretch/twist 11/15/22: cape method techniques for donning/doffing jacket 11/21/22: theraband HEP   GOALS:  SHORT TERM GOALS: Target date: 11/08/2022    Patient will demonstrate independence with initial RUE HEP. Baseline: Goal status: MET  2.  Pt will report no difficulty with combing or brushing hair. Baseline: a little difficulty Goal status: MET  3.  Pt will report ability to place dish in overhead cabinet with no difficulty Baseline: a little difficulty Goal status: MET   LONG TERM GOALS: Target date: 12/20/2022  Patient will demonstrate updated RUE HEP with 25% verbal cues or less for proper execution. Baseline:  Goal status: MET  2.  Patient will complete nine-hole peg with use of R in 30 seconds or less. Baseline: 37 sec Goal status: MET  3.  Pt will report handwriting is at least 75% of prior level. Baseline: 25% Goal status: MET in print (cues to slow down, aim big, formulate each letter)  4.  Pt will complete d/c FOTO Baseline:  Goal status: INITIAL  ASSESSMENT:  CLINICAL IMPRESSION: Pt has met all STG's and 3/4 LTG's at this time. Pt has improved with RUE function, limited by pain and difficulty sleeping at night.   PERFORMANCE DEFICITS: in functional skills including ADLs, IADLs, coordination, sensation, edema, strength, pain, Fine motor control, and UE functional use.   IMPAIRMENTS: are limiting patient from ADLs, IADLs, rest  and sleep, leisure, and social participation.   CO-MORBIDITIES: may have co-morbidities  that affects occupational performance. Patient will benefit from skilled OT to address above impairments and improve overall function.  REHAB POTENTIAL: Excellent  PLAN:  OT FREQUENCY: 2x/week  OT DURATION: up to 10 weeks as needed   PLANNED INTERVENTIONS: self care/ADL training, therapeutic exercise, therapeutic activity, neuromuscular re-education, ultrasound, paraffin, fluidotherapy, moist heat, contrast bath, patient/family education, visual/perceptual remediation/compensation, energy conservation, DME and/or AE instructions, and Re-evaluation  RECOMMENDED OTHER SERVICES: None at this time  CONSULTED AND AGREED WITH PLAN OF CARE: Patient  PLAN FOR NEXT SESSION: dynamic standing tasks if BP WFL's, FOTO, D/C next session   Sheran Lawless, OT 11/27/2022, 9:34 AM

## 2022-11-27 NOTE — Telephone Encounter (Signed)
**  Wants to make the change.

## 2022-11-27 NOTE — Telephone Encounter (Signed)
Can we work him in for virtual and in office to discuss

## 2022-11-27 NOTE — Therapy (Signed)
OUTPATIENT PHYSICAL THERAPY NEURO TREATMENT- 20TH VISIT PROGRESS NOTE   Patient Name: Jerome Cardenas MRN: [id number] DOB:[birth year], 72 y.o., male Today's Date: 11/27/2022  PCP: Dr. Michelle Nasuti in [place]  REFERRING PROVIDER: Butch Penny, NP  Physical Therapy Progress Note   Dates of Reporting Period: 09/07/22 - 11/27/22  See Note below for Objective Data and Assessment of Progress/Goals.    END OF SESSION:  PT End of Session - 11/27/22 1110     Visit Number 20    Number of Visits 27    Date for PT Re-Evaluation 12/06/22    Authorization Type Medicare    Progress Note Due on Visit 10    PT Start Time 1106    PT Stop Time 1148    PT Time Calculation (min) 42 min    Equipment Utilized During Treatment Gait belt    Activity Tolerance Patient tolerated treatment well    Behavior During Therapy WFL for tasks assessed/performed;Flat affect                  Past Medical History:  Diagnosis Date   Cancer (HCC)    prostate   CKD (chronic kidney disease) stage 2, GFR 60-89 ml/min 07/28/2022   HLD (hyperlipidemia)    Hypertension    Past Surgical History:  Procedure Laterality Date   APPENDECTOMY     CHOLECYSTECTOMY     HERNIA REPAIR     PROSTATE SURGERY     prostate cancer 2014   Patient Active Problem List   Diagnosis Date Noted   Diabetes mellitus type 2 with neurological manifestations (HCC) 07/29/2022   Hypertension associated with diabetes (HCC) 07/29/2022   Hyperlipidemia associated with type 2 diabetes mellitus (HCC) 07/29/2022   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    ONSET DATE: 08/29/2022 (referral)  REFERRING DIAG: W09.811,B14.7 (ICD-10-CM) - Abnormality of gait as late effect of stroke  THERAPY DIAG:  Hemiplegia and hemiparesis following cerebral infarction affecting right dominant side (HCC)  Unsteadiness on feet  Rationale for Evaluation and Treatment: Rehabilitation  SUBJECTIVE:                                                                                                                                                                                              SUBJECTIVE STATEMENT: Pt reports he continues to have back and shoulder pain. Got about an hour of sleep last night. No falls.    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 No  VITALS Vitals:  11/27/22 1115  BP: (!) 158/78  Pulse: 68      PRECAUTIONS: Fall  WEIGHT BEARING RESTRICTIONS: No  FALLS: Has patient fallen in last 6 months? No  LIVING ENVIRONMENT: Lives with: lives with their spouse Lives in: House/apartment Stairs: Yes: Internal: 7 +7 steps; on left going up Has following equipment at home: Quad cane small base  PLOF: Independent  PATIENT GOALS: "mainly getting the movement back in my hand like it was"  OBJECTIVE:   DIAGNOSTIC FINDINGS: MRI of brain on 07/28/22 IMPRESSION: Acute left thalamocapsular infarct.  CT of head/neck on 07/29/22 IMPRESSION: 1. Short segment occlusion of the left PCA P3 segment. 2. No proximal large vessel occlusion. 3. Chronic ischemic microangiopathy.  COGNITION: Overall cognitive status: Within functional limits for tasks assessed   SENSATION: Pt reports constant numbness/tingling in R hemibody   COORDINATION: Heel to shin test: WFL on LLE, dysmetric on RLE   EDEMA: Pt reports new edema in distal RLE and RUE   POSTURE: rounded shoulders, forward head, posterior pelvic tilt, and weight shift left  LOWER EXTREMITY MMT:  Tested in Seated position   MMT Right Eval Left Eval  Hip flexion 3+ 4  Hip extension    Hip abduction 4 4  Hip adduction 3+ 4-  Hip internal rotation    Hip external rotation    Knee flexion 2 4  Knee extension 3+ 4  Ankle dorsiflexion 4+ 4+  Ankle plantarflexion    Ankle inversion    Ankle  eversion    (Blank rows = not tested)   TODAY'S TREATMENT:     Ther Act  LTG Assessment  FOTO: 67    [facility] PT Assessment - 11/27/22 1118       Transfers   Five time sit to stand comments  14.44s   No UE support     Ambulation/Gait   Gait velocity 32.8' over 14.6s = 2.25 ft/s no AD      Mini-BESTest   Sit To Stand Normal: Comes to stand without use of hands and stabilizes independently.    Rise to Toes Moderate: Heels up, but not full range (smaller than when holding hands), OR noticeable instability for 3 s.    Stand on one leg (left) Normal: 20 s.   20s   Stand on one leg (right) Moderate: < 20 s   3.4s   Stand on one leg - lowest score 1    Stance - Feet together, eyes open, firm surface  Normal: 30s    Stance - Feet together, eyes closed, foam surface  Normal: 30s    Incline - Eyes Closed Normal: Stands independently 30s and aligns with gravity            Discussed results of outcome measures and POC moving forward. At this time, strongly encourage pt to contact Butch Penny, NP to inquire about Gabapentin as pt is not sleeping due to RUE pain and Baclofen has not been helpful. Informed pt that his vestibular function is normal and suspect something else is contributing to his feeling of instability at night, likely sleep deprivation. Pt verbalized understanding and stated he would walk over to [facility] today.    PATIENT EDUCATION: Education details: Continue HEP, goal assessment  Person educated: Patient Education method: Explanation Education comprehension: verbalized understanding  HOME EXERCISE PROGRAM: Access Code: [email or web address] URL: [email or web address] Date: 09/14/2022 Prepared by: Alethia Berthold Aslyn Cottman  Exercises - Feet together with eyes closed and head turns   - 1 x daily - 7  x weekly - 3-4 reps - 20-30 second hold - Tandem Stance in Corner  - 1 x daily - 7 x weekly - 3-4 reps - 15-30 second  hold - Seated Upper Trapezius Stretch  - 1 x daily - 7 x  weekly - 1 sets - 5 reps - 30 sec hold - Gentle Levator Scapulae Stretch  - 1 x daily - 7 x weekly - 1 sets - 5 reps - 30 sec hold  GOALS: Goals reviewed with patient? Yes   NEW LONG TERM GOALS FOR UPDATED POC: Target date: 11/19/2022   Pt will be independent with final HEP for improved strength, balance, transfers and gait.  Baseline:  Goal status: IN PROGRESS  2.  Pt will improve 5 x STS to less than or equal to 13 seconds without UE support to demonstrate improved functional strength and transfer efficiency.   Baseline: 16.62s w/hands braced on knees; 16.15s w/o UE support (4/11); 14.44s (5/21) Goal status: IN PROGRESS  3.  Pt will improve gait velocity to at least 2.8 ft/s w/LRAD for improved gait efficiency and reduced recurrent fall risk   Baseline: 1.27 ft/s without AD; 2.6 ft/s without AD (3/27); 2.74 ft/s no AD (4/11); 2.25 ft/s no AD (5/21) Goal status: IN PROGRESS  4.  Pt will improve Berg score to 52/56 for decreased fall risk  Baseline: 49/56; improved to 53/56 Goal status: MET  5.  Pt will improve FOTO to 61 for improved subjective functional ability   Baseline: 47; 60 (3/27); improved to 72 on 10/22/2022; 67 on 5/21   Goal Status: MET  6.  Patient will improve Mini-BEST score to 21/28 or greater to indicate a decreased risk of falls and improved static and dynamic stability to help increase patient's confidence in community/home environment.   Baseline: 17/28;   Goal Status: Initial  ASSESSMENT:  CLINICAL IMPRESSION: Emphasis of skilled PT session on LTG assessment and discussing POC moving forward. Pt continues to report inability to sleep due to RUE pain, stating he slept <1 hour night prior. 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 Pt continues to progress towards his LTGs, improving his 5x STS goal without UE support and meeting his FOTO goal. Pt ambulating  more slowly this date, but stated he did not take his morning meds this morning which could be contributing to decreased speed. So far, pt has improved performance on MiniBest but will need to be completed next session. Continue POC.     OBJECTIVE IMPAIRMENTS: Abnormal gait, decreased activity tolerance, decreased balance, decreased coordination, decreased endurance, difficulty walking, decreased strength, impaired sensation, and pain  ACTIVITY LIMITATIONS: carrying, lifting, bending, squatting, stairs, transfers, bed mobility, self feeding, hygiene/grooming, locomotion level, and caring for others  PARTICIPATION LIMITATIONS: meal prep, cleaning, medication management, interpersonal relationship, community activity, and yard work  PERSONAL FACTORS: Age, Fitness, Past/current experiences, and 1-2 comorbidities: HTN and CVA  are also affecting patient's functional outcome.   REHAB POTENTIAL: Good  CLINICAL DECISION MAKING: Stable/uncomplicated  EVALUATION COMPLEXITY: Low  PLAN:  PT FREQUENCY: 1-2x/week  PT DURATION: 6 weeks  PLANNED INTERVENTIONS: Therapeutic exercises, Therapeutic activity, Neuromuscular re-education, Balance training, Gait training, Patient/Family education, Self Care, Joint mobilization, Stair training, DME instructions, Dry Needling, Manual therapy, and Re-evaluation  PLAN FOR NEXT SESSION: Finish goal assessment. Did pt contact [facility]? Balance w/reduced vestibular input. RLE strength and single leg stability, higher level balance tasks  Jill Alexanders Aime Carreras, PT, DPT 11/27/2022, 11:48 AM

## 2022-11-27 NOTE — Telephone Encounter (Signed)
From feb office visit:   Patient started Baclofen 5 mg on 3/28 and increased dose to 10 mg on 10/30/22

## 2022-11-29 ENCOUNTER — Ambulatory Visit: Payer: Medicare Other | Admitting: Occupational Therapy

## 2022-11-29 ENCOUNTER — Encounter: Payer: Self-pay | Admitting: Occupational Therapy

## 2022-11-29 ENCOUNTER — Ambulatory Visit: Payer: Medicare Other | Admitting: Physical Therapy

## 2022-11-29 VITALS — BP 145/78 | HR 63

## 2022-11-29 DIAGNOSIS — M6281 Muscle weakness (generalized): Secondary | ICD-10-CM

## 2022-11-29 DIAGNOSIS — I69351 Hemiplegia and hemiparesis following cerebral infarction affecting right dominant side: Secondary | ICD-10-CM

## 2022-11-29 DIAGNOSIS — R278 Other lack of coordination: Secondary | ICD-10-CM

## 2022-11-29 DIAGNOSIS — R2681 Unsteadiness on feet: Secondary | ICD-10-CM

## 2022-11-29 DIAGNOSIS — R2689 Other abnormalities of gait and mobility: Secondary | ICD-10-CM

## 2022-11-29 NOTE — Therapy (Signed)
OUTPATIENT PHYSICAL THERAPY NEURO TREATMENT   Patient Name: Jerome Cardenas MRN: [id number] DOB:Nov 27, 1950, 72 y.o., male Today's Date: 11/29/2022  PCP: Dr. Michelle Nasuti in [place]  REFERRING PROVIDER: Butch Penny, NP     END OF SESSION:  PT End of Session - 11/29/22 1017     Visit Number 21    Number of Visits 27    Date for PT Re-Evaluation 12/06/22    Authorization Type Medicare    Progress Note Due on Visit 10    PT Start Time 1016    PT Stop Time 1100    PT Time Calculation (min) 44 min    Equipment Utilized During Treatment Gait belt    Activity Tolerance Patient tolerated treatment well    Behavior During Therapy WFL for tasks assessed/performed;Flat affect                   Past Medical History:  Diagnosis Date   Cancer (HCC)    prostate   CKD (chronic kidney disease) stage 2, GFR 60-89 ml/min 07/28/2022   HLD (hyperlipidemia)    Hypertension    Past Surgical History:  Procedure Laterality Date   APPENDECTOMY     CHOLECYSTECTOMY     HERNIA REPAIR     PROSTATE SURGERY     prostate cancer 2014   Patient Active Problem List   Diagnosis Date Noted   Diabetes mellitus type 2 with neurological manifestations (HCC) 07/29/2022   Hypertension associated with diabetes (HCC) 07/29/2022   Hyperlipidemia associated with type 2 diabetes mellitus (HCC) 07/29/2022   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    ONSET DATE: 08/29/2022 (referral)  REFERRING DIAG: W09.811,B14.7 (ICD-10-CM) - Abnormality of gait as late effect of stroke  THERAPY DIAG:  Hemiplegia and hemiparesis following cerebral infarction affecting right dominant side (HCC)  Muscle weakness (generalized)  Other lack of coordination  Unsteadiness on feet  Other abnormalities of gait and mobility  Rationale for Evaluation and Treatment: Rehabilitation  SUBJECTIVE:                                                                                                                                                                                              SUBJECTIVE STATEMENT: Pt with no complaints of pain today, he did go over to [facility] after his last PT session and asked to have a message relayed to [facility] about gabapentin, still waiting to hear back.  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 No  VITALS Vitals:   11/29/22 1020  BP: (!) 145/78  Pulse: 63       PRECAUTIONS: Fall  WEIGHT BEARING RESTRICTIONS: No  FALLS: Has patient fallen in last 6 months? No  LIVING ENVIRONMENT: Lives with: lives with their spouse Lives in: House/apartment Stairs: Yes: Internal: 7 +7 steps; on left going up Has following equipment at home: Quad cane small base  PLOF: Independent  PATIENT GOALS: "mainly getting the movement back in my hand like it was"  OBJECTIVE:   DIAGNOSTIC FINDINGS: MRI of brain on 07/28/22 IMPRESSION: Acute left thalamocapsular infarct.  CT of head/neck on 07/29/22 IMPRESSION: 1. Short segment occlusion of the left PCA P3 segment. 2. No proximal large vessel occlusion. 3. Chronic ischemic microangiopathy.  COGNITION: Overall cognitive status: Within functional limits for tasks assessed   SENSATION: Pt reports constant numbness/tingling in R hemibody   COORDINATION: Heel to shin test: WFL on LLE, dysmetric on RLE   EDEMA: Pt reports new edema in distal RLE and RUE   POSTURE: rounded shoulders, forward head, posterior pelvic tilt, and weight shift left  LOWER EXTREMITY MMT:  Tested in Seated position   MMT Right Eval Left Eval  Hip flexion 3+ 4  Hip extension    Hip abduction 4 4  Hip adduction 3+ 4-  Hip internal rotation    Hip external rotation    Knee flexion 2 4  Knee extension 3+ 4  Ankle dorsiflexion 4+ 4+  Ankle plantarflexion    Ankle inversion     Ankle eversion    (Blank rows = not tested)   TODAY'S TREATMENT:     Ther Act  For goal assessment:  [facility] PT Assessment - 11/29/22 1023       Standardized Balance Assessment   Standardized Balance Assessment Mini-BESTest      Mini-BESTest   Sit To Stand Normal: Comes to stand without use of hands and stabilizes independently.    Rise to Toes Normal: Stable for 3 s with maximum height.    Stand on one leg (left) Normal: 20 s.   20 sec   Stand on one leg (right) Moderate: < 20 s   8 sec   Stand on one leg - lowest score 1    Compensatory Stepping Correction - Forward Moderate: More than one step is required to recover equilibrium    Compensatory Stepping Correction - Backward Moderate: More than one step is required to recover equilibrium    Compensatory Stepping Correction - Left Lateral Normal: Recovers independently with 1 step (crossover or lateral OK)   cross over stepping   Compensatory Stepping Correction - Right Lateral Normal: Recovers independently with 1 step (crossover or lateral OK)    Stepping Corredtion Lateral - lowest score 2    Stance - Feet together, eyes open, firm surface  Normal: 30s    Stance - Feet together, eyes closed, foam surface  Normal: 30s    Incline - Eyes Closed Normal: Stands independently 30s and aligns with gravity    Change in Gait Speed Moderate: Unable to change walking speed or signs of imbalance    Walk with head turns - Horizontal Normal: performs head turns with no change in gait speed and good balance    Walk with pivot turns Moderate:Turns with feet close SLOW (>4 steps) with good balance.    Step over obstacles Moderate: Steps over box but touches box OR displays cautious behavior by slowing gait.    Timed UP & GO with Dual  Task Moderate: Dual Task affects either counting OR walking (>10%) when compared to the TUG without Dual Task.    Mini-BEST total score 21            To work on dynamic standing balance, hip flexor  strengthening, and increasing B step length and LE clearance during gait: Ambulation through 4" hurdles forwards 4 x 10 ft alt leading with L/R LE Ambulation through alt 4" and 6" hurdles forwards 4 x 10 ft alt leading with L/R LE Lateral stepping over 4" hurdles 4 x 10 ft each direction Pt initially with ER of hips but able to correct with v/c Ambulation through 4" hurdles forwards over blue mat/unlevel surface   TherEx: Resisted step taps to 6" then 12" step with BUE support with RTB, 2 x 10 reps each for hip flexor strengthening    PATIENT EDUCATION: Education details: Continue HEP, goal assessment  Person educated: Patient Education method: Explanation Education comprehension: verbalized understanding  HOME EXERCISE PROGRAM: Access Code: [email or web address] URL: [email or web address] Date: 09/14/2022 Prepared by: Alethia Berthold Plaster  Exercises - Feet together with eyes closed and head turns   - 1 x daily - 7 x weekly - 3-4 reps - 20-30 second hold - Tandem Stance in Corner  - 1 x daily - 7 x weekly - 3-4 reps - 15-30 second  hold - Seated Upper Trapezius Stretch  - 1 x daily - 7 x weekly - 1 sets - 5 reps - 30 sec hold - Gentle Levator Scapulae Stretch  - 1 x daily - 7 x weekly - 1 sets - 5 reps - 30 sec hold  GOALS: Goals reviewed with patient? Yes   NEW LONG TERM GOALS FOR UPDATED POC: Target date: 12/06/2022 (updated to match cert date)   Pt will be independent with final HEP for improved strength, balance, transfers and gait. Baseline:  Goal status: IN PROGRESS  2.  Pt will improve 5 x STS to less than or equal to 13 seconds without UE support to demonstrate improved functional strength and transfer efficiency.   Baseline: 16.62s w/hands braced on knees; 16.15s w/o UE support (4/11); 14.44s (5/21) Goal status: IN PROGRESS  3.  Pt will improve gait velocity to at least 2.8 ft/s w/LRAD for improved gait efficiency and reduced recurrent fall risk   Baseline: 1.27  ft/s without AD; 2.6 ft/s without AD (3/27); 2.74 ft/s no AD (4/11); 2.25 ft/s no AD (5/21) Goal status: IN PROGRESS  4.  Pt will improve Berg score to 52/56 for decreased fall risk  Baseline: 49/56; improved to 53/56 Goal status: MET  5.  Pt will improve FOTO to 61 for improved subjective functional ability   Baseline: 47; 60 (3/27); improved to 72 on 10/22/2022; 67 on 5/21   Goal Status: MET  6.  Patient will improve Mini-BEST score to 21/28 or greater to indicate a decreased risk of falls and improved static and dynamic stability to help increase patient's confidence in community/home environment.   Baseline: 17/28; 21/28 (5/23)  Goal Status: MET  ASSESSMENT:  CLINICAL IMPRESSION: Emphasis of skilled PT session on assessing miniBEST for initiating goal assessment and then continuing to work on dynamic balance and hip strengthening to improve gait impairments. Pt exhibits an improvement in his miniBEST score from 17/28 initially to 21/28 this date, demonstrating improved balance and decreased fall risk. Pt exhibits some ongoing hip flexor weakness as evidenced by circumduction of LE when navigating over hurdles. Pt continues to benefit from skilled therapy services  to work towards improving his balance and decreasing his fall risk. Continue POC.     OBJECTIVE IMPAIRMENTS: Abnormal gait, decreased activity tolerance, decreased balance, decreased coordination, decreased endurance, difficulty walking, decreased strength, impaired sensation, and pain  ACTIVITY LIMITATIONS: carrying, lifting, bending, squatting, stairs, transfers, bed mobility, self feeding, hygiene/grooming, locomotion level, and caring for others  PARTICIPATION LIMITATIONS: meal prep, cleaning, medication management, interpersonal relationship, community activity, and yard work  PERSONAL FACTORS: Age, Fitness, Past/current experiences, and 1-2 comorbidities: HTN and CVA  are also affecting patient's functional outcome.    REHAB POTENTIAL: Good  CLINICAL DECISION MAKING: Stable/uncomplicated  EVALUATION COMPLEXITY: Low  PLAN:  PT FREQUENCY: 1-2x/week  PT DURATION: 6 weeks  PLANNED INTERVENTIONS: Therapeutic exercises, Therapeutic activity, Neuromuscular re-education, Balance training, Gait training, Patient/Family education, Self Care, Joint mobilization, Stair training, DME instructions, Dry Needling, Manual therapy, and Re-evaluation  PLAN FOR NEXT SESSION: Did pt hear [facility]? Balance w/reduced vestibular input. RLE strength and single leg stability, higher level balance tasks   Peter Congo, PT, DPT, CSRS 11/29/2022, 11:00 AM

## 2022-11-29 NOTE — Therapy (Signed)
OUTPATIENT OCCUPATIONAL THERAPY NEURO TREATMENT/DISCHARGE   Patient Name: Jerome Cardenas MRN: [id number] DOB:1951/02/04, 72 y.o., male Today's Date: 11/29/2022  PCP: Dr. Michelle Nasuti in [place]  REFERRING PROVIDER: Butch Penny, NP  END OF SESSION:  OT End of Session - 11/29/22 0935     Visit Number 13    Number of Visits 21    Date for OT Re-Evaluation 12/20/22    Authorization Type Medicare    Progress Note Due on Visit 20    OT Start Time 0935    OT Stop Time 1015    OT Time Calculation (min) 40 min    Activity Tolerance Patient tolerated treatment well    Behavior During Therapy [facility] for tasks assessed/performed            Past Medical History:  Diagnosis Date   Cancer (HCC)    prostate   CKD (chronic kidney disease) stage 2, GFR 60-89 ml/min 07/28/2022   HLD (hyperlipidemia)    Hypertension    Past Surgical History:  Procedure Laterality Date   APPENDECTOMY     CHOLECYSTECTOMY     HERNIA REPAIR     PROSTATE SURGERY     prostate cancer 2014   Patient Active Problem List   Diagnosis Date Noted   Diabetes mellitus type 2 with neurological manifestations (HCC) 07/29/2022   Hypertension associated with diabetes (HCC) 07/29/2022   Hyperlipidemia associated with type 2 diabetes mellitus (HCC) 07/29/2022   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    ONSET DATE: 07/26/2022  REFERRING DIAG: N82.956 (ICD-10-CM) - Hemiplegia and hemiparesis following cerebral infarction affecting right dominant side   THERAPY DIAG:  Hemiplegia and hemiparesis following cerebral infarction affecting right dominant side (HCC)  Other lack of coordination  Muscle weakness (generalized)  Unsteadiness on feet  Rationale for Evaluation and Treatment: Rehabilitation  SUBJECTIVE:   SUBJECTIVE STATEMENT: I'm better today - had a better night's sleep. Also, the NP put an order in for gabapentin but haven't started yet.  Pt accompanied by: self  PERTINENT HISTORY: "Jerome Cardenas is a 72 y.o. male presenting with left thalamic stroke outside of TPA window. High risk due to current malignancy and HLD. PMH significant for CKD stage II, HTN, GERD, vitamin D deficiency, hyperlipidemia, BPH, metastatic prostate cancer s/p radiation therapy.    Patient reported symptoms of right arm and leg weakness with sensation changes starting 1/18.  Presented to the hospital 1/20 after he developed right facial tingling that morning.  Patient was outside of tPA window.  CT head showed only chronic small vessel ischemia, no bleeding.  MRI head with acute left thalamic capsular infarct.  CTA demonstrates short segment occlusion of left PCA P3 segment and chronic ischemic microangiopathy with no large vessel occlusion.  Echo with LVEF 60-65%. No RWA. Mild LVH. G1DD.    Neurology consulted, recommended routine at risk stratification.  Was found to have A1c of 6.6 and lipids significant for triglycerides 371, HDL 23, and VLDL 74.  TSH, CBC, and BMP unremarkable aside from elevated glucose to 200."  evaluation for RUE weakness and difficulty performing ADLs   PRECAUTIONS: Fall  WEIGHT BEARING RESTRICTIONS: No  PAIN:  Are you having pain? Yes: NPRS scale: 0/10 today Pain location: Rt side Pain description: aching and numbness Aggravating factors: movement Relieving factors: nothing  FALLS: Has patient fallen in last 6 months? No  LIVING ENVIRONMENT: Lives with: lives with their spouse Lives in: House/apartment Stairs: Yes: Internal: 7 +7 steps; on left going up Has  following equipment at home: Quad cane small base  PLOF: Independent; driving; Office manager, worked in Office manager  PATIENT GOALS: Improve writing; get into his truck easier like before  OBJECTIVE: (from evaluation unless otherwise noted)  HAND DOMINANCE: Right  ADLs: Overall ADLs: mod I IADLs: Community mobility: driving currently Handwriting: 25% legible; writing gets sloppier with time  MOBILITY STATUS:  Independent  ACTIVITY TOLERANCE: Activity tolerance: fair  FUNCTIONAL OUTCOME MEASURES: FOTO: Slight: 67  UPPER EXTREMITY ROM:     AROM Right (eval) Left (eval)  Shoulder flexion WNL WNL  Shoulder abduction WNL WNL  Elbow flexion WNL WNL  Elbow extension WNL WNL  Wrist flexion WNL WNL  Wrist extension WNL WNL  Wrist pronation WNL WNL  Wrist supination WNL WNL   Digit Composite Flexion WNL WNL  Digit Composite Extension WNL WNL  Digit Opposition WNL WNL  (Blank rows = not tested)  UPPER EXTREMITY MMT:     MMT Right (eval) Left (eval)  Shoulder flexion [facility] WNL  Shoulder abduction WFL WNL  Elbow flexion WFL WNL  Elbow extension WFL WNL  (Blank rows = not tested)  HAND FUNCTION: Grip strength: Right: 77.1 lbs; Left: 77.6 lbs  COORDINATION: 9 Hole Peg test: Right: 37 sec; Left: 24 sec  11/01/22 Right hand - 26.93 seconds  SENSATION: Reports paresthesias in RUE from shoulder distally  EDEMA: Reports mild to moderate swelling especially at night; no observed edema at eval.   MUSCLE TONE: WFL  COGNITION: Overall cognitive status: Within functional limits for tasks assessed  VISION: Subjective report: He went to the optometrist last month; reports no change; no double vision Baseline vision: Wears glasses all the time and Progressive lenses Visual history:  n/a  OBSERVATIONS: 11/01/22 - Pt ambulates without LOB with and without cane for short distances in the gym. Glasses donned.    TODAY'S TREATMENT:                                  BP = 151/80  Assessed remaining goal and progress to date. FOTO complete  Standing/functional reaching tasks to work on dynamic standing, wt shifts, trunk rotation, stepping outside BOS, UE coordination and pinch strength to place clothespins on antenna and to table - worked on bilaterally  Small rest break, then progressed to UBE x 5 mintues, level 3 for UE strength/endurance    PATIENT EDUCATION: FOTO scores  HOME  EXERCISE PROGRAM:  10/18/22: coordination and putty HEP 10/25/22: bed positioning, scapula HEP, Sh flex HEP 10/29/22: Reviewed bed positioning and UE ROM including with use of cane 11/01/22: Reviewed UE ROM with cane (will need handouts next visit) 11/07/22: Shoulder HEP  11/13/22: spinal stretch/twist 11/15/22: cape method techniques for donning/doffing jacket 11/21/22: theraband HEP   GOALS:  SHORT TERM GOALS: Target date: 11/08/2022    Patient will demonstrate independence with initial RUE HEP. Baseline: Goal status: MET  2.  Pt will report no difficulty with combing or brushing hair. Baseline: a little difficulty Goal status: MET  3.  Pt will report ability to place dish in overhead cabinet with no difficulty Baseline: a little difficulty Goal status: MET   LONG TERM GOALS: Target date: 12/20/2022    Patient will demonstrate updated RUE HEP with 25% verbal cues or less for proper execution. Baseline:  Goal status: MET  2.  Patient will complete nine-hole peg with use of R in 30 seconds or less. Baseline: 37 sec Goal  status: MET  3.  Pt will report handwriting is at least 75% of prior level. Baseline: 25% Goal status: MET in print (cues to slow down, aim big, formulate each letter)  4.  Pt will complete d/c FOTO Baseline: 67 Goal status: MET (77)  ASSESSMENT:  CLINICAL IMPRESSION: Pt has met all LTG's. Pt has improved with RUE function, limited by pain and difficulty sleeping at night.   PERFORMANCE DEFICITS: in functional skills including ADLs, IADLs, coordination, sensation, edema, strength, pain, Fine motor control, and UE functional use.   IMPAIRMENTS: are limiting patient from ADLs, IADLs, rest and sleep, leisure, and social participation.   CO-MORBIDITIES: may have co-morbidities  that affects occupational performance. Patient will benefit from skilled OT to address above impairments and improve overall function.  REHAB POTENTIAL: Excellent  PLAN:  OT  FREQUENCY: 2x/week  OT DURATION: up to 10 weeks as needed   PLANNED INTERVENTIONS: self care/ADL training, therapeutic exercise, therapeutic activity, neuromuscular re-education, ultrasound, paraffin, fluidotherapy, moist heat, contrast bath, patient/family education, visual/perceptual remediation/compensation, energy conservation, DME and/or AE instructions, and Re-evaluation  RECOMMENDED OTHER SERVICES: None at this time  CONSULTED AND AGREED WITH PLAN OF CARE: Patient  PLAN d/c O.T.   OCCUPATIONAL THERAPY DISCHARGE SUMMARY  Visits from Start of Care: 13  Current functional level related to goals / functional outcomes: Pt has met all goals - see above   Remaining deficits: Pain at night both arms (Rt > Lt) but denies pain during day/OT sessions Balance   Education / Equipment: See above HEP section for all ex's and education  Patient agrees to discharge. Patient goals were met. Patient is being discharged due to meeting the stated rehab goals.Sheran Lawless, OT 11/29/2022, 10:10 AM

## 2022-12-04 ENCOUNTER — Ambulatory Visit: Payer: Medicare Other | Admitting: Physical Therapy

## 2022-12-04 ENCOUNTER — Ambulatory Visit: Payer: Medicare Other | Admitting: Occupational Therapy

## 2022-12-04 VITALS — BP 162/78 | HR 66

## 2022-12-04 DIAGNOSIS — I69351 Hemiplegia and hemiparesis following cerebral infarction affecting right dominant side: Secondary | ICD-10-CM

## 2022-12-04 DIAGNOSIS — M6281 Muscle weakness (generalized): Secondary | ICD-10-CM

## 2022-12-04 DIAGNOSIS — R2689 Other abnormalities of gait and mobility: Secondary | ICD-10-CM

## 2022-12-04 DIAGNOSIS — R278 Other lack of coordination: Secondary | ICD-10-CM

## 2022-12-04 DIAGNOSIS — R2681 Unsteadiness on feet: Secondary | ICD-10-CM

## 2022-12-04 NOTE — Telephone Encounter (Signed)
Monday? That's fine

## 2022-12-04 NOTE — Telephone Encounter (Signed)
I am now only seeing virtual visits open on Friday August 16th. Are you saying you can do sooner?

## 2022-12-04 NOTE — Therapy (Signed)
OUTPATIENT PHYSICAL THERAPY NEURO TREATMENT   Patient Name: Jerome Cardenas MRN: [id number] DOB:[birth year], 72 y.o., male Today's Date: 12/04/2022  PCP: Dr. Michelle Nasuti in [place]  REFERRING PROVIDER: Butch Penny, NP     END OF SESSION:  PT End of Session - 12/04/22 0847     Visit Number 22    Number of Visits 27    Date for PT Re-Evaluation 12/06/22    Authorization Type Medicare    Progress Note Due on Visit 10    PT Start Time 0845    PT Stop Time 0927    PT Time Calculation (min) 42 min    Equipment Utilized During Treatment Gait belt    Activity Tolerance Patient tolerated treatment well    Behavior During Therapy WFL for tasks assessed/performed;Flat affect                    Past Medical History:  Diagnosis Date   Cancer (HCC)    prostate   CKD (chronic kidney disease) stage 2, GFR 60-89 ml/min 07/28/2022   HLD (hyperlipidemia)    Hypertension    Past Surgical History:  Procedure Laterality Date   APPENDECTOMY     CHOLECYSTECTOMY     HERNIA REPAIR     PROSTATE SURGERY     prostate cancer 2014   Patient Active Problem List   Diagnosis Date Noted   Diabetes mellitus type 2 with neurological manifestations (HCC) 07/29/2022   Hypertension associated with diabetes (HCC) 07/29/2022   Hyperlipidemia associated with type 2 diabetes mellitus (HCC) 07/29/2022   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    ONSET DATE: 08/29/2022 (referral)  REFERRING DIAG: W41.324,M01.0 (ICD-10-CM) - Abnormality of gait as late effect of stroke  THERAPY DIAG:  Hemiplegia and hemiparesis following cerebral infarction affecting right dominant side (HCC)  Muscle weakness (generalized)  Other lack of coordination  Unsteadiness on feet  Other abnormalities of gait and mobility  Rationale for Evaluation and Treatment: Rehabilitation  SUBJECTIVE:                                                                                                                                                                                              SUBJECTIVE STATEMENT: Pt reports no acute changes since last visit, no pain today.  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 No  VITALS Vitals:   12/04/22 0854  BP: (!) 162/78  Pulse: 66     PRECAUTIONS: Fall  WEIGHT BEARING RESTRICTIONS:  No  FALLS: Has patient fallen in last 6 months? No  LIVING ENVIRONMENT: Lives with: lives with their spouse Lives in: House/apartment Stairs: Yes: Internal: 7 +7 steps; on left going up Has following equipment at home: Quad cane small base  PLOF: Independent  PATIENT GOALS: "mainly getting the movement back in my hand like it was"  OBJECTIVE:   DIAGNOSTIC FINDINGS: MRI of brain on 07/28/22 IMPRESSION: Acute left thalamocapsular infarct.  CT of head/neck on 07/29/22 IMPRESSION: 1. Short segment occlusion of the left PCA P3 segment. 2. No proximal large vessel occlusion. 3. Chronic ischemic microangiopathy.  COGNITION: Overall cognitive status: Within functional limits for tasks assessed   SENSATION: Pt reports constant numbness/tingling in R hemibody   COORDINATION: Heel to shin test: WFL on LLE, dysmetric on RLE   EDEMA: Pt reports new edema in distal RLE and RUE   POSTURE: rounded shoulders, forward head, posterior pelvic tilt, and weight shift left  LOWER EXTREMITY MMT:  Tested in Seated position   MMT Right Eval Left Eval  Hip flexion 3+ 4  Hip extension    Hip abduction 4 4  Hip adduction 3+ 4-  Hip internal rotation    Hip external rotation    Knee flexion 2 4  Knee extension 3+ 4  Ankle dorsiflexion 4+ 4+  Ankle plantarflexion    Ankle inversion    Ankle eversion    (Blank rows = not tested)   TODAY'S TREATMENT:      Ther Act:  Reviewed HEP, provided new  handout and added to/adjusted HEP, see bolded below  In // bars to work on ankle strategy for balance recovery and prevention of LOB: Static stance on flat side of Bosu 3 x 30 sec each with no UE support and CGA Static Romberg stance on flat side of Bosu 3 x 30 sec each with no UE support and CGA Posterior and R lateral LOB during last repetition due to fatigue  With use of 4" beam and BLE 4# ankle weights to work on LE strengthening and improving step length: Alt L/R forwards step-overs 2 x 10 reps each Lateral L/R step-overs 2 x 10 reps each Pt requires min A for balance during task due to several almost LOB  PATIENT EDUCATION: Education details: Continue HEP and add to/adjusted HEP, discussed PT POC with plan to d/c next session Person educated: Patient Education method: Explanation and Handouts Education comprehension: verbalized understanding  HOME EXERCISE PROGRAM: Access Code: [email or web address] URL: [email or web address] Date: 09/14/2022 Prepared by: Alethia Berthold Plaster  Exercises - Feet together with eyes closed and head turns   - 1 x daily - 7 x weekly - 3-4 reps - 20-30 second hold - Tandem Stance in Corner  - 1 x daily - 7 x weekly - 3-4 reps - 15-30 second  hold - Single Leg Stance  - 1 x daily - 7 x weekly - 1 sets - 3-4 reps - 30 sec hold - Seated Upper Trapezius Stretch  - 1 x daily - 7 x weekly - 1 sets - 5 reps - 30 sec hold - Gentle Levator Scapulae Stretch  - 1 x daily - 7 x weekly - 1 sets - 5 reps - 30 sec hold  GOALS: Goals reviewed with patient? Yes   NEW LONG TERM GOALS FOR UPDATED POC: Target date: 12/06/2022 (updated to match cert date)   Pt will be independent with final HEP for improved strength, balance, transfers and gait. Baseline:  Goal status: IN PROGRESS  2.  Pt will improve 5 x STS to less than or equal to 13 seconds without UE support to demonstrate improved functional strength and transfer efficiency.   Baseline: 16.62s w/hands braced on  knees; 16.15s w/o UE support (4/11); 14.44s (5/21) Goal status: IN PROGRESS  3.  Pt will improve gait velocity to at least 2.8 ft/s w/LRAD for improved gait efficiency and reduced recurrent fall risk   Baseline: 1.27 ft/s without AD; 2.6 ft/s without AD (3/27); 2.74 ft/s no AD (4/11); 2.25 ft/s no AD (5/21) Goal status: IN PROGRESS  4.  Pt will improve Berg score to 52/56 for decreased fall risk  Baseline: 49/56; improved to 53/56 Goal status: MET  5.  Pt will improve FOTO to 61 for improved subjective functional ability   Baseline: 47; 60 (3/27); improved to 72 on 10/22/2022; 67 on 5/21   Goal Status: MET  6.  Patient will improve Mini-BEST score to 21/28 or greater to indicate a decreased risk of falls and improved static and dynamic stability to help increase patient's confidence in community/home environment.   Baseline: 17/28; 21/28 (5/23)  Goal Status: MET  ASSESSMENT:  CLINICAL IMPRESSION: Emphasis of skilled PT session on reviewing HEP and adding to/adjusting HEP for appropriate challenge level as well as continue to work on standing balance. Pt with good use of ankle strategy with stance on Bosu this date, with onset of fatigue does have LOB posteriorly and to the R with decreased ability to correct. Pt also continues to exhibit B hip flexor weakness leading to decreased step length and step height. Pt continues to benefit from skilled therapy services to work towards LTGs. Continue POC.     OBJECTIVE IMPAIRMENTS: Abnormal gait, decreased activity tolerance, decreased balance, decreased coordination, decreased endurance, difficulty walking, decreased strength, impaired sensation, and pain  ACTIVITY LIMITATIONS: carrying, lifting, bending, squatting, stairs, transfers, bed mobility, self feeding, hygiene/grooming, locomotion level, and caring for others  PARTICIPATION LIMITATIONS: meal prep, cleaning, medication management, interpersonal relationship, community activity, and  yard work  PERSONAL FACTORS: Age, Fitness, Past/current experiences, and 1-2 comorbidities: HTN and CVA  are also affecting patient's functional outcome.   REHAB POTENTIAL: Good  CLINICAL DECISION MAKING: Stable/uncomplicated  EVALUATION COMPLEXITY: Low  PLAN:  PT FREQUENCY: 1-2x/week  PT DURATION: 6 weeks  PLANNED INTERVENTIONS: Therapeutic exercises, Therapeutic activity, Neuromuscular re-education, Balance training, Gait training, Patient/Family education, Self Care, Joint mobilization, Stair training, DME instructions, Dry Needling, Manual therapy, and Re-evaluation  PLAN FOR NEXT SESSION: Did pt hear back from [facility]? Assess LTG and d/c   Peter Congo, PT, DPT, CSRS 12/04/2022, 9:28 AM

## 2022-12-05 NOTE — Telephone Encounter (Signed)
Spoke with patient. He is free the week of June 17th but he does prefer an in-office visit.

## 2022-12-06 ENCOUNTER — Ambulatory Visit: Payer: Medicare Other | Admitting: Occupational Therapy

## 2022-12-06 ENCOUNTER — Ambulatory Visit: Payer: Medicare Other | Admitting: Physical Therapy

## 2022-12-06 VITALS — BP 160/87 | HR 66

## 2022-12-06 DIAGNOSIS — M6281 Muscle weakness (generalized): Secondary | ICD-10-CM

## 2022-12-06 DIAGNOSIS — I69351 Hemiplegia and hemiparesis following cerebral infarction affecting right dominant side: Secondary | ICD-10-CM | POA: Diagnosis not present

## 2022-12-06 DIAGNOSIS — R2681 Unsteadiness on feet: Secondary | ICD-10-CM

## 2022-12-06 DIAGNOSIS — R2689 Other abnormalities of gait and mobility: Secondary | ICD-10-CM

## 2022-12-06 NOTE — Therapy (Signed)
OUTPATIENT PHYSICAL THERAPY NEURO TREATMENT-DISCHARGE NOTE   Patient Name: GARMON SHINA MRN: [id number] DOB:[birth year], 72 y.o., male Today's Date: 12/06/2022  PCP: Dr. Michelle Nasuti in [place]  REFERRING PROVIDER: Butch Penny, NP   PHYSICAL THERAPY DISCHARGE SUMMARY  Visits from Start of Care: 23  Current functional level related to goals / functional outcomes: Mod I with SBQC   Remaining deficits: Decreased gait speed   Education / Equipment: Handout for HEP   Patient agrees to discharge. Patient goals were partially met. Patient is being discharged due to maximized rehab potential.       END OF SESSION:  PT End of Session - 12/06/22 1149     Visit Number 23    Number of Visits 27    Date for PT Re-Evaluation 12/06/22    Authorization Type Medicare    Progress Note Due on Visit 10    PT Start Time 1148    PT Stop Time 1204   d/c   PT Time Calculation (min) 16 min    Equipment Utilized During Treatment Gait belt    Activity Tolerance Patient tolerated treatment well    Behavior During Therapy WFL for tasks assessed/performed;Flat affect                     Past Medical History:  Diagnosis Date   Cancer (HCC)    prostate   CKD (chronic kidney disease) stage 2, GFR 60-89 ml/min 07/28/2022   HLD (hyperlipidemia)    Hypertension    Past Surgical History:  Procedure Laterality Date   APPENDECTOMY     CHOLECYSTECTOMY     HERNIA REPAIR     PROSTATE SURGERY     prostate cancer 2014   Patient Active Problem List   Diagnosis Date Noted   Diabetes mellitus type 2 with neurological manifestations (HCC) 07/29/2022   Hypertension associated with diabetes (HCC) 07/29/2022   Hyperlipidemia associated with type 2 diabetes mellitus (HCC) 07/29/2022   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    ONSET DATE: 08/29/2022 (referral)  REFERRING DIAG: W09.811,B14.7 (ICD-10-CM) - Abnormality of gait as late effect of stroke  THERAPY DIAG:  Hemiplegia  and hemiparesis following cerebral infarction affecting right dominant side (HCC)  Muscle weakness (generalized)  Unsteadiness on feet  Other abnormalities of gait and mobility  Rationale for Evaluation and Treatment: Rehabilitation  SUBJECTIVE:                                                                                                                                                                                             SUBJECTIVE STATEMENT: Pt reports no acute changes since  last visit.  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 No  VITALS Vitals:   12/06/22 1151  BP: (!) 160/87  Pulse: 66      PRECAUTIONS: Fall  WEIGHT BEARING RESTRICTIONS: No  FALLS: Has patient fallen in last 6 months? No  LIVING ENVIRONMENT: Lives with: lives with their spouse Lives in: House/apartment Stairs: Yes: Internal: 7 +7 steps; on left going up Has following equipment at home: Quad cane small base  PLOF: Independent  PATIENT GOALS: "mainly getting the movement back in my hand like it was"  OBJECTIVE:   DIAGNOSTIC FINDINGS: MRI of brain on 07/28/22 IMPRESSION: Acute left thalamocapsular infarct.  CT of head/neck on 07/29/22 IMPRESSION: 1. Short segment occlusion of the left PCA P3 segment. 2. No proximal large vessel occlusion. 3. Chronic ischemic microangiopathy.  COGNITION: Overall cognitive status: Within functional limits for tasks assessed   SENSATION: Pt reports constant numbness/tingling in R hemibody   COORDINATION: Heel to shin test: WFL on LLE, dysmetric on RLE   EDEMA: Pt reports new edema in distal RLE and RUE   POSTURE: rounded shoulders, forward head, posterior pelvic tilt, and weight shift left  LOWER EXTREMITY MMT:  Tested in Seated position   MMT Right Eval Left Eval  Hip  flexion 3+ 4  Hip extension    Hip abduction 4 4  Hip adduction 3+ 4-  Hip internal rotation    Hip external rotation    Knee flexion 2 4  Knee extension 3+ 4  Ankle dorsiflexion 4+ 4+  Ankle plantarflexion    Ankle inversion    Ankle eversion    (Blank rows = not tested)   TODAY'S TREATMENT:      Ther Act:  For LTG assessment:  [facility] PT Assessment - 12/06/22 1153       Ambulation/Gait   Gait velocity 32.8 ft over 13.72 sec = 2.39 ft/sec      Standardized Balance Assessment   Standardized Balance Assessment Five Times Sit to Stand    Five times sit to stand comments  12.16 sec   hands on thighs             PATIENT EDUCATION: Education details: Continue HEP Person educated: Patient Education method: Explanation Education comprehension: verbalized understanding  HOME EXERCISE PROGRAM: Access Code: [email or web address] URL: [email or web address] Date: 09/14/2022 Prepared by: Alethia Berthold Plaster  Exercises - Feet together with eyes closed and head turns   - 1 x daily - 7 x weekly - 3-4 reps - 20-30 second hold - Tandem Stance in Corner  - 1 x daily - 7 x weekly - 3-4 reps - 15-30 second  hold - Single Leg Stance  - 1 x daily - 7 x weekly - 1 sets - 3-4 reps - 30 sec hold - Seated Upper Trapezius Stretch  - 1 x daily - 7 x weekly - 1 sets - 5 reps - 30 sec hold - Gentle Levator Scapulae Stretch  - 1 x daily - 7 x weekly - 1 sets - 5 reps - 30 sec hold  GOALS: Goals reviewed with patient? Yes   NEW LONG TERM GOALS FOR UPDATED POC: Target date: 12/06/2022 (updated to match cert date)   Pt will be independent with final HEP for improved strength, balance, transfers and gait. Baseline:  Goal status: MET  2.  Pt will improve 5  x STS to less than or equal to 13 seconds without UE support to demonstrate improved functional strength and transfer efficiency.   Baseline: 16.62s w/hands braced on knees; 16.15s w/o UE support (4/11); 14.44s (5/21), 12.16 sec (5/30) Goal  status: MET  3.  Pt will improve gait velocity to at least 2.8 ft/s w/LRAD for improved gait efficiency and reduced recurrent fall risk   Baseline: 1.27 ft/s without AD; 2.6 ft/s without AD (3/27); 2.74 ft/s no AD (4/11); 2.25 ft/s no AD (5/21), 2.39 ft/sec no AD (5/30) Goal status: NOT MET  4.  Pt will improve Berg score to 52/56 for decreased fall risk  Baseline: 49/56; improved to 53/56 Goal status: MET  5.  Pt will improve FOTO to 61 for improved subjective functional ability   Baseline: 47; 60 (3/27); improved to 72 on 10/22/2022; 67 on 5/21   Goal Status: MET  6.  Patient will improve Mini-BEST score to 21/28 or greater to indicate a decreased risk of falls and improved static and dynamic stability to help increase patient's confidence in community/home environment.   Baseline: 17/28; 21/28 (5/23)  Goal Status: MET  ASSESSMENT:  CLINICAL IMPRESSION: Emphasis of skilled PT session on reassessing LTG in preparation for d/c from PT services this date. Pt has met 5/6 LTG due to being independent with his HEP and improving his overall balance and strength and decreasing his fall risk. Pt did improve his gait speed from last assessment but did not improve enough to meet his LTG. Pt to continue with his HEP and return to PT in a few months if needed.    OBJECTIVE IMPAIRMENTS: Abnormal gait, decreased activity tolerance, decreased balance, decreased coordination, decreased endurance, difficulty walking, decreased strength, impaired sensation, and pain  ACTIVITY LIMITATIONS: carrying, lifting, bending, squatting, stairs, transfers, bed mobility, self feeding, hygiene/grooming, locomotion level, and caring for others  PARTICIPATION LIMITATIONS: meal prep, cleaning, medication management, interpersonal relationship, community activity, and yard work  PERSONAL FACTORS: Age, Fitness, Past/current experiences, and 1-2 comorbidities: HTN and CVA  are also affecting patient's functional outcome.    REHAB POTENTIAL: Good  CLINICAL DECISION MAKING: Stable/uncomplicated  EVALUATION COMPLEXITY: Low     Peter Congo, PT, DPT, CSRS 12/06/2022, 12:04 PM

## 2022-12-06 NOTE — Telephone Encounter (Signed)
I can't I have a meeting at that time. You can place on the wait list. I always have appointment open up if he wants an in office

## 2022-12-10 ENCOUNTER — Ambulatory Visit: Payer: Medicare Other | Admitting: Occupational Therapy

## 2022-12-10 NOTE — Telephone Encounter (Signed)
Patient added to wait list

## 2022-12-13 ENCOUNTER — Ambulatory Visit: Payer: Medicare Other | Admitting: Occupational Therapy

## 2022-12-17 ENCOUNTER — Encounter: Payer: Medicare Other | Admitting: Occupational Therapy

## 2022-12-20 ENCOUNTER — Encounter: Payer: Medicare Other | Admitting: Occupational Therapy

## 2022-12-21 ENCOUNTER — Encounter: Payer: Medicare Other | Admitting: Occupational Therapy

## 2023-01-30 ENCOUNTER — Encounter: Payer: Self-pay | Admitting: Neurology

## 2023-01-30 ENCOUNTER — Ambulatory Visit (INDEPENDENT_AMBULATORY_CARE_PROVIDER_SITE_OTHER): Payer: Medicare Other | Admitting: Neurology

## 2023-01-30 VITALS — BP 123/76 | HR 69 | Ht 70.0 in | Wt 223.0 lb

## 2023-01-30 DIAGNOSIS — R202 Paresthesia of skin: Secondary | ICD-10-CM

## 2023-01-30 DIAGNOSIS — I6381 Other cerebral infarction due to occlusion or stenosis of small artery: Secondary | ICD-10-CM

## 2023-01-30 MED ORDER — TOPIRAMATE 25 MG PO TABS: 25.0000 mg | ORAL_TABLET | Freq: Every day | ORAL | 2 refills | Status: DC

## 2023-01-30 NOTE — Progress Notes (Signed)
[facility] [address] [place]. [name] [address] [phone]       OFFICE FOLLOW-UP NOTE  Mr. Jerome Cardenas Date of Birth:  Aug 15, 1950 Medical Record Number:  [id number]   HPI: Mr. Kloster is a 72 year old Caucasian male seen today for office follow-up visit following last office visit with Everlene Other nurse practitioner on 08/29/2022.  He has past medical history of hypertension, hyperlipidemia, chronic kidney disease, prostate cancer.  He presented on 07/28/2022 with 2-day history of right facial droop and right-sided weakness and numbness and hemiataxia.  CT head showed no acute finding and MRI scan showed left lateral thalamic/posterior limb internal capsule infarct.  CT angiogram showed left P3 occlusion.  2D echo showed ejection fraction of 60 to 65%.  LDL cholesterol 67 mg percent.  Triglycerides were elevated at 371.  Hemoglobin A1c was 6.6.  He was initially started on dual antiplatelet therapy aspirin and Plavix for 3 months and then aspirin alone.  Patient states he has noticed improvement in right-sided strength but he still has significant right-sided paresthesias.  These have been bothersome and are not getting better.  He has not tried any medications for his paresthesias.  He also complains of occasional leg spasms at night.  He has finished outpatient physical and Occupational Therapy.  He is able to ambulate with a cane.  He does have a wheeled walker which she uses rarely.  He has had no falls or injuries.  Still has some diminished fine motor skills on the right side.  He has had no recurrent stroke or TIA symptoms.  He is tolerating aspirin well without bruising or bleeding.  He is tolerating Crestor well without muscle aches and pains.  He has an upcoming visit with primary care physician and will have follow-up lipid profile checked.  He is on Xtandi for his prostate cancer.  ROS:   14 system review of systems is positive for numbness, tingling,  diminished fine motor skills, difficulty walking, leg spasms and all other systems negative  PMH:  Past Medical History:  Diagnosis Date   Cancer (HCC)    prostate   CKD (chronic kidney disease) stage 2, GFR 60-89 ml/min 07/28/2022   HLD (hyperlipidemia)    Hypertension     Social History:  Social History   Socioeconomic History   Marital status: Married    Spouse name: Not on file   Number of children: Not on file   Years of education: Not on file   Highest education level: Not on file  Occupational History   Not on file  Tobacco Use   Smoking status: Never   Smokeless tobacco: Never  Substance and Sexual Activity   Alcohol use: No   Drug use: No   Sexual activity: Yes  Other Topics Concern   Not on file  Social History Narrative   Not on file   Social Determinants of Health   Financial Resource Strain: Low Risk  (05/02/2022)   [facility] to 09[facility]08[facility], [facility] visits prior to 09/08/2022., [facility]   Overall Financial Resource Strain (CARDIA)    Difficulty of Paying Living Expenses: Not hard at all  Food Insecurity: No Food Insecurity (07/29/2022)   Hunger Vital Sign    Worried About Running Out of Food in the Last Year: Never true    Ran Out of Food in the Last Year: Never true  Transportation Needs: No Transportation Needs (07/29/2022)  PRAPARE - Administrator, Civil Service (Medical): No    Lack of Transportation (Non-Medical): No  Physical Activity: Unknown (05/02/2022)   [facility] prior to 09/08/2022[facility], [facility] visits prior to 09/08/2022., [facility]   Exercise Vital Sign    Days of Exercise per Week: Patient declined    Minutes of Exercise per Session: 0 min  Stress: Stress Concern Present (05/02/2022)   [facility], [facility]  visits prior to 09/08/2022., [facility] visits prior to 09/08/2022., [facility]   [employer] of Occupational Health - Occupational Stress Questionnaire    Feeling of Stress : Very much  Social Connections: Unknown (05/02/2022)   [facility], [facility] visits prior to 09/08/2022., [name] visits prior to 09/08/2022., [facility]   Social Connection and Isolation Panel [NHANES]    Frequency of Communication with Friends and Family: Once a week    Frequency of Social Gatherings with Friends and Family: Once a week    Attends Religious Services: Patient declined    Active Member of Clubs or Organizations: Yes    Attends Banker Meetings: Never    Marital Status: Married  Catering manager Violence: Not At Risk (05/02/2022)   [facility] visits prior to 09/08/2022., [facility] visits prior to 09/08/2022.   Humiliation, Afraid, Rape, and Kick questionnaire    Fear of Current or Ex-Partner: No    Emotionally Abused: No    Physically Abused: No    Sexually Abused: No    Medications:   Current Outpatient Medications on File Prior to Visit  Medication Sig Dispense Refill   amLODipine (NORVASC) 10 MG tablet Take 10 mg by mouth daily.     APPLE CIDER VINEGAR PO Take 1 tablet by mouth daily.     aspirin EC 81 MG tablet Take 1 tablet (81 mg total) by mouth daily. Swallow whole. 30 tablet 12   baclofen (LIORESAL) 10 MG tablet Take 1 tablet (10 mg total) by mouth at bedtime. 30 each 1   calcium carbonate (SUPER CALCIUM) 1500 (600 Ca) MG TABS tablet Take 1 tablet by mouth daily.     Cholecalciferol (VITAMIN D-3 PO) Take 1 capsule by mouth daily.     Cyanocobalamin (VITAMIN B-12 PO) Take 1 tablet by mouth daily.     ELDERBERRY PO Take 1 capsule by mouth daily.     enzalutamide (XTANDI) 40 MG capsule Take 160 mg by mouth every evening.      fenofibrate (TRICOR) 48 MG tablet Take 48 mg by mouth daily.     finasteride (PROSCAR) 5 MG tablet Take 5 mg by mouth daily.     icosapent Ethyl (VASCEPA) 1 g capsule Take 1 g by mouth 2 (two) times daily.     Magnesium 400 MG TABS Take 400 mg by mouth daily.     metFORMIN (GLUCOPHAGE) 500 MG tablet Take 500 mg by mouth daily with breakfast.     metoprolol succinate (TOPROL-XL) 50 MG 24 hr tablet Take 75 mg by mouth daily. Take with or immediately following a meal.     olmesartan (BENICAR) 40 MG tablet Take 40 mg by mouth daily.     omeprazole (PRILOSEC) 20 MG capsule Take 20 mg by mouth daily.     rosuvastatin (CRESTOR) 20 MG tablet  Take 1 tablet (20 mg total) by mouth daily. 30 tablet 1   spironolactone (ALDACTONE) 25 MG tablet Take 25 mg by mouth daily.     tamsulosin (FLOMAX) 0.4 MG CAPS capsule Take 0.4 mg by mouth every evening.     No current facility-administered medications on file prior to visit.    Allergies:   Allergies  Allergen Reactions   Pravachol [Pravastatin] Other (See Comments)    Myalgias     Physical Exam General: well developed, well nourished, pleasant elderly Caucasian male seated, in no evident distress Head: head normocephalic and atraumatic.  Neck: supple with no carotid or supraclavicular bruits Cardiovascular: regular rate and rhythm, no murmurs Musculoskeletal: no deformity Skin:  no rash/petichiae Vascular:  Normal pulses all extremities Vitals:   01/30/23 1005  BP: 123/76  Pulse: 69   Neurologic Exam Mental Status: Awake and fully alert. Oriented to place and time. Recent and remote memory intact. Attention span, concentration and fund of knowledge appropriate. Mood and affect appropriate.  Cranial Nerves: Fundoscopic exam reveals sharp disc margins. Pupils equal, briskly reactive to light. Extraocular movements full without nystagmus. Visual fields full to confrontation. Hearing intact. Facial sensation intact. Face, tongue, palate moves normally  and symmetrically.  Motor: Normal bulk and tone. Normal strength in all tested extremity muscles.  Mild weakness of right grip.  Diminished fine finger movements on the right.  Orbits left or right upper extremity. Sensory.: i diminished sensation to touch ,pinprick .position and vibratory sensation on the right side.  Coordination: Rapid alternating movements normal in all extremities. Finger-to-nose and heel-to-shin performed accurately bilaterally. Gait and Station: Arises from chair without difficulty. Stance is normal. Gait demonstrates normal stride length and balance mild dragging of the right leg.. Able to heel, toe and tandem walk with moderate difficulty.  Reflexes: 1+ and symmetric. Toes downgoing.   NIHSS  2 Modified Rankin  3   ASSESSMENT: 72 year old Caucasian male with left thalamic infarct and January 2024 with significant vessel poststroke paresthesias.  Vascular risk factors of hypertension and hyperlipidemia     PLAN:I had a long d/w patient about his recent thalamic stroke, post stroke paresthesias,risk for recurrent stroke/TIAs, personally independently reviewed imaging studies and stroke evaluation results and answered questions.Continue aspirin 81 mg daily  for secondary stroke prevention and maintain strict control of hypertension with blood pressure goal below 130/90, diabetes with hemoglobin A1c goal below 6.5% and lipids with LDL cholesterol goal below 70 mg/dL. I also advised the patient to eat a healthy diet with plenty of whole grains, cereals, fruits and vegetables, exercise regularly and maintain ideal body weight .trial of Topamax 25 mg twice daily for poststroke paresthesias if unable to tolerate or not effective may consider switching to gabapentin.  He was encouraged to use his cane or walker at all times while walking outdoors.  Followup in the future with Everlene Other, nurse practitioner in 6 months or call earlier if necessary Greater than 50% of time during  this 35 minute visit was spent on counseling,explanation of diagnosis of thalamic stroke and poststroke paresthesias, planning of further management, discussion with patient and family and coordination of care Delia Heady, MD Note: This document was prepared with digital dictation and possible smart phrase technology. Any transcriptional errors that result from this process are unintentional

## 2023-01-30 NOTE — Patient Instructions (Signed)
I had a long d/w patient about his recent stroke, risk for recurrent stroke/TIAs, personally independently reviewed imaging studies and stroke evaluation results and answered questions.Continue aspirin 81 mg daily  for secondary stroke prevention and maintain strict control of hypertension with blood pressure goal below 130/90, diabetes with hemoglobin A1c goal below 6.5% and lipids with LDL cholesterol goal below 70 mg/dL. I also advised the patient to eat a healthy diet with plenty of whole grains, cereals, fruits and vegetables, exercise regularly and maintain ideal body weight .trial of Topamax 25 mg twice daily for poststroke paresthesias if unable to tolerate or not effective may consider switching to gabapentin.  He was encouraged to use his cane or walker at all times while walking outdoors.  Followup in the future with Everlene Other, nurse practitioner in 6 months or call earlier if necessary

## 2023-02-22 ENCOUNTER — Encounter (INDEPENDENT_AMBULATORY_CARE_PROVIDER_SITE_OTHER): Payer: Medicare Other | Admitting: Ophthalmology

## 2023-02-22 DIAGNOSIS — H43813 Vitreous degeneration, bilateral: Secondary | ICD-10-CM

## 2023-02-22 DIAGNOSIS — H348322 Tributary (branch) retinal vein occlusion, left eye, stable: Secondary | ICD-10-CM | POA: Diagnosis not present

## 2023-02-22 DIAGNOSIS — I10 Essential (primary) hypertension: Secondary | ICD-10-CM

## 2023-02-22 DIAGNOSIS — H35033 Hypertensive retinopathy, bilateral: Secondary | ICD-10-CM

## 2023-02-22 DIAGNOSIS — Z7984 Long term (current) use of oral hypoglycemic drugs: Secondary | ICD-10-CM | POA: Diagnosis not present

## 2023-02-22 DIAGNOSIS — E113293 Type 2 diabetes mellitus with mild nonproliferative diabetic retinopathy without macular edema, bilateral: Secondary | ICD-10-CM | POA: Diagnosis not present

## 2023-02-22 DIAGNOSIS — D3132 Benign neoplasm of left choroid: Secondary | ICD-10-CM | POA: Diagnosis not present

## 2023-03-26 ENCOUNTER — Encounter: Payer: Self-pay | Admitting: Adult Health

## 2023-03-26 ENCOUNTER — Ambulatory Visit (INDEPENDENT_AMBULATORY_CARE_PROVIDER_SITE_OTHER): Payer: Medicare Other | Admitting: Adult Health

## 2023-03-26 VITALS — BP 150/76 | HR 65 | Ht 70.0 in | Wt 221.0 lb

## 2023-03-26 DIAGNOSIS — I6381 Other cerebral infarction due to occlusion or stenosis of small artery: Secondary | ICD-10-CM

## 2023-03-26 DIAGNOSIS — R269 Unspecified abnormalities of gait and mobility: Secondary | ICD-10-CM

## 2023-03-26 DIAGNOSIS — R202 Paresthesia of skin: Secondary | ICD-10-CM

## 2023-03-26 DIAGNOSIS — I69398 Other sequelae of cerebral infarction: Secondary | ICD-10-CM | POA: Diagnosis not present

## 2023-03-26 MED ORDER — TOPIRAMATE 25 MG PO TABS: 25.0000 mg | ORAL_TABLET | Freq: Two times a day (BID) | ORAL | 11 refills | Status: AC

## 2023-03-26 NOTE — Progress Notes (Signed)
PATIENT: Jerome Cardenas DOB: [birth year]  REASON FOR VISIT: follow up HISTORY FROM: patient PRIMARY NEUROLOGIST: Dr. Pearlean Brownie  Chief Complaint  Patient presents with   Follow-up    Rm 18. Alone. Patient says he is not doing well. He c/o spasms and weakness in the right arm and right leg. He would like to be prescribed different medication for spasms.     HISTORY OF PRESENT ILLNESS: Today 03/26/23:  Jerome Cardenas is a 72 y.o. male with a history of left Lateral thalamic infarct. Returns today for follow-up.  At the last visit with Dr. Pearlean Brownie he was started on Topamax for paresthesias on the right side.  Reports that he continues to have pins and needle sensation on the right side of the body in the arm and the leg.  He also reports muscle spasms on the right side.  He was on baclofen 10 mg at bedtime but did not find it beneficial so it was not refilled.  He remains on aspirin without significant bruising.  Blood pressure is slightly elevated today.  PCP is checking lipid panel.  Denies any stroke like symptoms.     01/30/23: Jerome Cardenas is a 72 year old Caucasian male seen today for office follow-up visit following last office visit with Everlene Other nurse practitioner on 08/29/2022.  He has past medical history of hypertension, hyperlipidemia, chronic kidney disease, prostate cancer.  He presented on 07/28/2022 with 2-day history of right facial droop and right-sided weakness and numbness and hemiataxia.  CT head showed no acute finding and MRI scan showed left lateral thalamic/posterior limb internal capsule infarct.  CT angiogram showed left P3 occlusion.  2D echo showed ejection fraction of 60 to 65%.  LDL cholesterol 67 mg percent.  Triglycerides were elevated at 371.  Hemoglobin A1c was 6.6.  He was initially started on dual antiplatelet therapy aspirin and Plavix for 3 months and then aspirin alone.  Patient states he has noticed improvement in right-sided strength but he still has  significant right-sided paresthesias.  These have been bothersome and are not getting better.  He has not tried any medications for his paresthesias.  He also complains of occasional leg spasms at night.  He has finished outpatient physical and Occupational Therapy.  He is able to ambulate with a cane.  He does have a wheeled walker which she uses rarely.  He has had no falls or injuries.  Still has some diminished fine motor skills on the right side.  He has had no recurrent stroke or TIA symptoms.  He is tolerating aspirin well without bruising or bleeding.  He is tolerating Crestor well without muscle aches and pains.  He has an upcoming visit with primary care physician and will have follow-up lipid profile checked.  He is on Xtandi for his prostate cancer.   08/29/22: Jerome Cardenas is a 72 y.o. 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 Returns today for follow-up.  Patient reports that he continues to have trouble with his gait.  Uses a cane when ambulating.  No falls. No PT or OT. Reports that he has spasms in the right arm and leg usually daily. Only last a few minutes. Typically happens at night. Only on ASA.  Took Plavix for 1 month.  HISTORY Jerome Cardenas is a 72 y.o. male with history of HTN, HLD, borderline DM admitted for right sided weakness and numbness with right facial droop. No  tPA given due to outside window.     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 Patient counseled to be compliant with his antithrombotic medications Ongoing aggressive  stroke risk factor management Therapy recommendations:  HH PT Disposition:  pending    REVIEW OF SYSTEMS: Out of a complete 14 system review of symptoms, the patient complains only of the following symptoms, and all other reviewed systems are negative.  ALLERGIES: That red cell and they are now reaching a point okay you should hear something within the next week about simple therapy okay I will put it in today for care   HOME MEDICATIONS: Outpatient Medications Prior to Visit  Medication Sig Dispense Refill   amLODipine (NORVASC) 10 MG tablet Take 10 mg by mouth daily.     APPLE CIDER VINEGAR PO Take 1 tablet by mouth daily.     aspirin EC 81 MG tablet Take 1 tablet (81 mg total) by mouth daily. Swallow whole. 30 tablet 12   calcium carbonate (SUPER CALCIUM) 1500 (600 Ca) MG TABS tablet Take 1 tablet by mouth daily.     Cholecalciferol (VITAMIN D-3 PO) Take 1 capsule by mouth daily.     Cyanocobalamin (VITAMIN B-12 PO) Take 1 tablet by mouth daily.     ELDERBERRY PO Take 1 capsule by mouth daily.     enzalutamide (XTANDI) 40 MG capsule Take 160 mg by mouth every evening.     fenofibrate (TRICOR) 48 MG tablet Take 48 mg by mouth daily.     finasteride (PROSCAR) 5 MG tablet Take 5 mg by mouth daily.     icosapent Ethyl (VASCEPA) 1 g capsule Take 1 g by mouth 2 (two) times daily.     Magnesium 400 MG TABS Take 400 mg by mouth daily.     metFORMIN (GLUCOPHAGE) 500 MG tablet Take 500 mg by mouth 2 (two) times daily with a meal.     metoprolol succinate (TOPROL-XL) 50 MG 24 hr tablet Take 75 mg by mouth daily. Take with or immediately following a meal.     olmesartan (BENICAR) 40 MG tablet Take 40 mg by mouth daily.     omeprazole (PRILOSEC) 20 MG capsule Take 20 mg by mouth daily.     rosuvastatin (CRESTOR) 20 MG tablet Take 1 tablet (20 mg total) by mouth daily. 30 tablet 1   spironolactone (ALDACTONE) 50 MG tablet Take 25 mg by mouth daily.     tamsulosin (FLOMAX) 0.4 MG CAPS capsule  Take 0.4 mg by mouth every evening.     baclofen (LIORESAL) 10 MG tablet Take 1 tablet (10 mg total) by mouth at bedtime. (Patient not taking: Reported on 03/26/2023) 30 each 1   topiramate (TOPAMAX) 25 MG tablet Take 1 tablet (25 mg total) by mouth at bedtime. (Patient not taking: Reported on 03/26/2023) 30 tablet 2   No facility-administered medications prior to visit.    PAST MEDICAL HISTORY: Past Medical History:  Diagnosis Date   Cancer [facility])    prostate   CKD (chronic kidney disease) stage 2, GFR 60-89 ml/min 07/28/2022   HLD (  hyperlipidemia)    Hypertension     PAST SURGICAL HISTORY: Past Surgical History:  Procedure Laterality Date   APPENDECTOMY     CHOLECYSTECTOMY     HERNIA REPAIR     PROSTATE SURGERY     prostate cancer 2014    FAMILY HISTORY: Family History  Problem Relation Age of Onset   Stroke Neg Hx     SOCIAL HISTORY: Social History   Socioeconomic History   Marital status: Married    Spouse name: Not on file   Number of children: Not on file   Years of education: Not on file   Highest education level: Not on file  Occupational History   Not on file  Tobacco Use   Smoking status: Never   Smokeless tobacco: Never  Substance and Sexual Activity   Alcohol use: No   Drug use: No   Sexual activity: Yes  Other Topics Concern   Not on file  Social History Narrative   Not on file   Social Determinants of Health   Financial Resource Strain: Low Risk  (05/02/2022)   [facility]08[facility] visits prior to 09/08/2022[facility]   Overall Financial Resource Strain (CARDIA)    Difficulty of Paying Living Expenses: Not hard at all  Food Insecurity: No Food Insecurity (07/29/2022)   Hunger Vital Sign    Worried About Running Out of Food in the Last Year: Never true    Ran Out of Food in the Last Year: Never true  Transportation Needs: No  Transportation Needs (07/29/2022)   PRAPARE - Administrator, Civil Service (Medical): No    Lack of Transportation (Non-Medical): No  Physical Activity: Unknown (05/02/2022)   [facility] to 09/08/2022[facility] visits prior to 09/08/2022., [facility]   Exercise Vital Sign    Days of Exercise per Week: Patient declined    Minutes of Exercise per Session: 0 min  Stress: Stress Concern Present (05/02/2022)   [facility] prior to 09/08/2022., [facility] visits prior to 09/08/2022., [facility]   [employer] of Occupational Health - Occupational Stress Questionnaire    Feeling of Stress : Very much  Social Connections: Unknown (05/02/2022)   [facility] visits prior to 09/08/2022., [facility] visits prior to 09/08/2022., [facility]   Social Connection and Isolation Panel [NHANES]    Frequency of Communication with Friends and Family: Once a week    Frequency of Social Gatherings with Friends and Family: Once a week    Attends Religious Services: Patient declined    Active Member of Clubs or Organizations: Yes    Attends Banker Meetings: Never    Marital Status: Married  Catering manager Violence: Not At Risk (05/02/2022)   [facility] prior to 09/08/2022., [facility] visits prior to 09/08/2022.   Humiliation, Afraid, Rape, and Kick questionnaire    Fear of Current or Ex-Partner: No    Emotionally Abused: No    Physically Abused: No    Sexually Abused: No      PHYSICAL EXAM  Vitals:   03/26/23 1053  BP: (!) 150/76  Pulse: 65  Weight: 221 lb (100.2 kg)  Height: 5\' 10"  (1.778 m)   Body mass index is 31.71 kg/m.  Generalized: Well developed, in no  acute distress   Neurological examination  Mentation: Alert oriented to time, place, history taking. Follows all commands speech and language fluent Cranial nerve II-XII: Pupils were equal round reactive to light. Extraocular movements were full, visual field were full on confrontational test. Facial sensation and strength were normal. Uvula tongue midline. Head turning and shoulder shrug  were normal and symmetric. Motor: The motor testing reveals 5 over 5 strength in the left upper and lower extremities.  4/5 in the right upper and lower extremity Sensory: Sensory testing is intact to soft touch on all 4 extremities. No evidence of extinction is noted.  Coordination: Cerebellar testing reveals good finger-nose-finger and heel-to-shin bilaterally.  Gait and station: Patient requires assistance with standing.  Uses a cane when ambulating.  4 steps with turns.  Slight limp on the right side.   DIAGNOSTIC DATA (LABS, IMAGING, TESTING) - I reviewed patient records, labs, notes, testing and imaging myself where available.  Lab Results  Component Value Date   WBC 7.3 07/29/2022   HGB 14.9 07/29/2022   HCT 40.8 07/29/2022   MCV 84.8 07/29/2022   PLT 161 07/29/2022      Component Value Date/Time   NA 134 (L) 07/29/2022 0235   K 3.7 07/29/2022 0235   CL 99 07/29/2022 0235   CO2 24 07/29/2022 0235   GLUCOSE 200 (H) 07/29/2022 0235   BUN 15 07/29/2022 0235   CREATININE 1.04 07/29/2022 0235   CALCIUM 9.3 07/29/2022 0235   PROT 6.8 07/28/2022 1237   ALBUMIN 4.1 07/28/2022 1237   AST 21 07/28/2022 1237   ALT 15 07/28/2022 1237   ALKPHOS 86 07/28/2022 1237   BILITOT 0.3 07/28/2022 1237   GFRNONAA >60 07/29/2022 0235   Lab Results  Component Value Date   CHOL 139 07/29/2022   HDL 23 (L) 07/29/2022   LDLCALC NOT CALCULATED 07/29/2022   LDLDIRECT 67 07/29/2022   TRIG 371 (H) 07/29/2022   CHOLHDL 6.0 07/29/2022   Lab Results  Component Value Date   HGBA1C 6.6 (H) 07/29/2022   No  results found for: "VITAMINB12" Lab Results  Component Value Date   TSH 3.821 07/29/2022      ASSESSMENT AND PLAN 72 y.o. year old male  has a past medical history of Cancer (HCC), CKD (chronic kidney disease) stage 2, GFR 60-89 ml/min (07/28/2022), HLD (hyperlipidemia), and Hypertension. here with :  Stroke:  left lateral thalamic infarct likely secondary to large vessel disease source  2.   Abnormality of gait and balance 3.    Paresthesias   Continue aspirin 81 mg  for secondary stroke prevention.  Discussed secondary stroke prevention measures and importance of close PCP follow up for aggressive stroke risk factor management. I have gone over the pathophysiology of stroke, warning signs and symptoms, risk factors and their management in some detail with instructions to go to the closest emergency room for symptoms of concern. HTN: BP goal <130/90.  HLD: LDL goal <70. Recent LDL 67. TG 317 DMII: A1c goal<7.0. Recent A1c 6.6.  Encouraged patient to monitor diet and encouraged exercise Order placed for physical therapy Increase Topamax to 25 mg twice a day.advised if this is not beneficial he should let us know.  May need to consider a different muscle relaxer in the future if he continues to have muscle spasms.   FU with our office in 6 months or sooner  if needed      Butch Penny, MSN, NP-C 03/26/2023, 10:59 AM [facility] [address], [address] [place], [name] [address] [phone]

## 2023-03-26 NOTE — Patient Instructions (Signed)
Your Plan:  Continue ASA  Blood pressure goal <130/90 Cholesterol LDL goal <70 Diabetes goal A1c <7 Monitor diet and try to exercise  Increase topamax 50 mg at bedtime  If this is not helpful call in 2-3 weeks we will consider adjusting or switching to another medication   Thank you for coming to see [place] at [facility]. I hope we have been able to provide you high quality care today.  You may receive a patient satisfaction survey over the next few weeks. We would appreciate your feedback and comments so that we may continue to improve ourselves and the health of our patients.

## 2023-04-01 ENCOUNTER — Encounter (HOSPITAL_COMMUNITY): Payer: Self-pay

## 2023-04-01 ENCOUNTER — Emergency Department (HOSPITAL_COMMUNITY)
Admission: EM | Admit: 2023-04-01 | Discharge: 2023-04-01 | Disposition: A | Discharge status: Home / Self Care | Payer: Medicare Other | Attending: Emergency Medicine | Admitting: Emergency Medicine

## 2023-04-01 ENCOUNTER — Other Ambulatory Visit: Payer: Self-pay

## 2023-04-01 DIAGNOSIS — R55 Syncope and collapse: Secondary | ICD-10-CM | POA: Diagnosis present

## 2023-04-01 DIAGNOSIS — I951 Orthostatic hypotension: Secondary | ICD-10-CM

## 2023-04-01 DIAGNOSIS — R531 Weakness: Secondary | ICD-10-CM | POA: Insufficient documentation

## 2023-04-01 LAB — BASIC METABOLIC PANEL
Anion gap: 11 (ref 5–15)
BUN: 16 mg/dL (ref 8–23)
CO2: 23 mmol/L (ref 22–32)
Calcium: 9 mg/dL (ref 8.9–10.3)
Chloride: 102 mmol/L (ref 98–111)
Creatinine, Ser: 1.12 mg/dL (ref 0.61–1.24)
GFR, Estimated: >60 mL/min (ref >60)
Glucose, Bld: 150 mg/dL — ABNORMAL HIGH (ref 70–99)
Potassium: 3.8 mmol/L (ref 3.5–5.1)
Sodium: 136 mmol/L (ref 135–145)

## 2023-04-01 LAB — CBC
HCT: 42 % (ref 39.0–52.0)
Hemoglobin: 14.9 g/dL (ref 13.0–17.0)
MCH: 30.5 pg (ref 26.0–34.0)
MCHC: 35.5 g/dL (ref 30.0–36.0)
MCV: 85.9 fL (ref 80.0–100.0)
Platelets: 202 10*3/uL (ref 150–400)
RBC: 4.89 MIL/uL (ref 4.22–5.81)
RDW: 12.7 % (ref 11.5–15.5)
WBC: 7.6 10*3/uL (ref 4.0–10.5)
nRBC: 0 % (ref 0.0–0.2)

## 2023-04-01 LAB — CBG MONITORING, ED: Glucose-Capillary: 144 mg/dL — ABNORMAL HIGH (ref 70–99)

## 2023-04-01 NOTE — ED Triage Notes (Signed)
Patient had parked in parking deck and walked to registers of deed and then back and usually walks with a walker and didn't have it or a bench to rest and pushed too hard and had syncopal episode hitting back of head.  No blood thinners.  Denies all complaints.

## 2023-04-01 NOTE — Discharge Instructions (Addendum)
You have been referred to cardiology. Please call PCP should you not hear from them for further evaluation and referral.

## 2023-04-01 NOTE — ED Provider Notes (Signed)
[place] EMERGENCY DEPARTMENT AT [facility] Provider Note   CSN: [id number] Arrival date & time: 04/01/23  1242     History Chief Complaint  Patient presents with   Loss of Consciousness    HPI Jerome Cardenas is a 72 y.o. male presenting for syncope after he was walking from the parking deck to the deeds office and back. He usually uses a walker but did not have it with him this time. He was grabbing along the wall, felt weak, and fell to the ground. Did not have LOC. He denies hitting his head although he said that bystanders did say he did. He has a hx of a stroke with residual right sided weakness. Denies having any other neurological deficits at this time. Denies having a history of any cardiac disease. CP, Chest palpitations, SOB. Had not had much to eat that morning and had not had anything to drink since the evening prior.   Patient's recorded medical, surgical, social, medication list and allergies were reviewed in the Snapshot window as part of the initial history.   Review of Systems   Review of Systems  Respiratory:  Negative for cough, chest tightness and shortness of breath.   Cardiovascular:  Negative for chest pain, palpitations and leg swelling.  Gastrointestinal:  Negative for abdominal pain, blood in stool, diarrhea, nausea and vomiting.  Endocrine: Positive for polydipsia and polyuria. Negative for polyphagia.  Musculoskeletal:  Positive for gait problem.  Neurological:  Positive for syncope, weakness and light-headedness. Negative for tremors and headaches.    Physical Exam Updated Vital Signs BP (!) 150/80 (BP Location: Right Arm)   Pulse 67   Temp 98.5 F (36.9 C) (Oral)   Resp 16   Ht 5\' 10"  (1.778 m)   Wt 100.2 kg   SpO2 98%   BMI 31.71 kg/m  Physical Exam Constitutional:      General: He is not in acute distress.    Appearance: He is not ill-appearing or toxic-appearing.  Cardiovascular:     Rate and Rhythm: Normal rate and regular  rhythm.     Heart sounds: No murmur heard.    No friction rub. No gallop.  Pulmonary:     Effort: No respiratory distress.     Breath sounds: No wheezing, rhonchi or rales.  Abdominal:     General: Bowel sounds are normal. There is no distension.     Palpations: Abdomen is soft.     Tenderness: There is no abdominal tenderness. There is no guarding or rebound.  Musculoskeletal:     Right lower leg: No edema.     Left lower leg: No edema.  Neurological:     Mental Status: He is alert and oriented to person, place, and time.     Cranial Nerves: Cranial nerves 2-12 are intact.     Motor: Weakness present.     Comments: 5/5 motor strength on the left  4/5 motor strength on the right Upper and lower extremities  No signs of trauma on the head.  No focal neurological deficits.  Sensation intact    ED Course/ Medical Decision Making/ A&P Clinical Course as of 04/02/23 0039  Mon Apr 01, 2023  1625 Stable (Resident)  Hx of DM, CVA with baseline weakness here with a chief complaint of syncope while walking to his car. (Usually uses a walker but forgot it) Witnesses saw fall and head contact.   [CC]    Clinical Course User Index [CC] Glyn Ade, MD  Medical Decision Making:    Jerome Cardenas is a 72 y.o. male who presented to the ED today for further evaluation after a fall.    Reviewed and confirmed nursing documentation for past medical history, family history, social history.    Initial Assessment:   With the patient's presentation the most likely diagnosis is an orthostatic fall. Other diagnoses were considered including (but not limited to) a cardiac etiology to his fall. These are considered less likely due to history of present illness and physical exam findings.     Initial Plan:  Screening labs including CBC and Metabolic panel to evaluate for infectious or metabolic etiology of disease.  EKG to evaluate for cardiac pathology. Objective evaluation as below  reviewed with plan for close reassessment  Initial Study Results:   Laboratory  All laboratory results reviewed without evidence of clinically relevant pathology.    Glucose 154   EKG EKG was reviewed independently. Rate, rhythm, axis, intervals all examined and without medically relevant abnormality. ST segments without concerns for elevations.    Reassessment and Plan:    Jerome Cardenas is a 72 y.o. who has a PMH of a stroke with residual right motor deficits. He describes feeling weak and feeling like he was going to fall after he had walked to the deeds office and back to the parking lot. He had nothing to drink since the night prior. He denies hitting his head. Has no focal deficits on exam. EKG is NSR. Instructed to follow up with Cardiology if symptoms persist, to eat healthy snacks and drink water.   Clinical Impression:  1. Orthostatic syncope      Discharge   Final Clinical Impression(s) / ED Diagnoses Final diagnoses:  Orthostatic syncope    Rx / DC Orders ED Discharge Orders          Ordered    Ambulatory referral to Cardiology       Comments: If you have not heard from the Cardiology office within the next 72 hours please call [phone].   04/01/23 1644              [place], [place], MD 04/02/23 6073    Glyn Ade, MD 04/02/23 1601

## 2023-04-03 ENCOUNTER — Telehealth: Payer: Self-pay | Admitting: Physical Therapy

## 2023-04-03 ENCOUNTER — Ambulatory Visit: Payer: Medicare Other | Attending: Adult Health | Admitting: Physical Therapy

## 2023-04-03 VITALS — BP 145/74 | HR 82

## 2023-04-03 DIAGNOSIS — R2681 Unsteadiness on feet: Secondary | ICD-10-CM | POA: Insufficient documentation

## 2023-04-03 DIAGNOSIS — R269 Unspecified abnormalities of gait and mobility: Secondary | ICD-10-CM | POA: Diagnosis not present

## 2023-04-03 DIAGNOSIS — M6281 Muscle weakness (generalized): Secondary | ICD-10-CM | POA: Diagnosis present

## 2023-04-03 DIAGNOSIS — I6381 Other cerebral infarction due to occlusion or stenosis of small artery: Secondary | ICD-10-CM | POA: Insufficient documentation

## 2023-04-03 DIAGNOSIS — R2689 Other abnormalities of gait and mobility: Secondary | ICD-10-CM | POA: Diagnosis present

## 2023-04-03 DIAGNOSIS — I69398 Other sequelae of cerebral infarction: Secondary | ICD-10-CM | POA: Insufficient documentation

## 2023-04-03 NOTE — Telephone Encounter (Signed)
Butch Penny NP,  Mr. Mican Coby was evaluated by Physical Therapy on 04/03/23.  The patient would benefit from an Occupational Therapy evaluation for R hand and RUE weakness with decreased ability to complete his ADLs independently.    If you agree, please place an order in [facility] workque in [facility] or fax the order to [phone]. Thank you, Peter Congo, PT, DPT, [facility] [address] [place], [name]  [address] Phone:  [phone] Fax:  [phone]

## 2023-04-03 NOTE — Therapy (Signed)
OUTPATIENT PHYSICAL THERAPY NEURO EVALUATION   Patient Name: Jerome Cardenas MRN: [id number] DOB:09-26-1950, 72 y.o., male Today's Date: 04/03/2023   PCP: Kerin Salen, PA-C REFERRING PROVIDER: Butch Penny, NP  END OF SESSION:  PT End of Session - 04/03/23 1452     Visit Number 1    Number of Visits 13   with eval   Date for PT Re-Evaluation 05/29/23   to allow for scheduling delays   Authorization Type Medicare    PT Start Time 1447    PT Stop Time 1525    PT Time Calculation (min) 38 min    Activity Tolerance Patient tolerated treatment well;Patient limited by fatigue    Behavior During Therapy [facility] for tasks assessed/performed             Past Medical History:  Diagnosis Date   Cancer (HCC)    prostate   CKD (chronic kidney disease) stage 2, GFR 60-89 ml/min 07/28/2022   HLD (hyperlipidemia)    Hypertension    Past Surgical History:  Procedure Laterality Date   APPENDECTOMY     CHOLECYSTECTOMY     HERNIA REPAIR     PROSTATE SURGERY     prostate cancer 2014   Patient Active Problem List   Diagnosis Date Noted   Diabetes mellitus type 2 with neurological manifestations (HCC) 07/29/2022   Hypertension associated with diabetes (HCC) 07/29/2022   Hyperlipidemia associated with type 2 diabetes mellitus (HCC) 07/29/2022   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    ONSET DATE: 03/26/2023  REFERRING DIAG:  Diagnosis  I63.81 (ICD-10-CM) - Left thalamic infarction (HCC)  I69.398,R26.9 (ICD-10-CM) - Abnormality of gait as late effect of stroke    THERAPY DIAG:  Muscle weakness (generalized)  Unsteadiness on feet  Other abnormalities of gait and mobility  Rationale for Evaluation and Treatment: Rehabilitation  SUBJECTIVE:                                                                                                                                                                                             SUBJECTIVE STATEMENT: Pt had a fall a  few days ago, states "I fell out" in the parking garage coming back from 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 Pt reports he has been feeling extremely tired, no energy at all, been feeling that way since before he fell. Pt reports he has been feeling this way for months, started after d/c from OPPT last time.  Pt reports he has been working on HEP from last POC but it hasn't seemed to help. Pt states, "My R arm and my R leg have gotten worse". Pt also  demonstrates that the fingers in his R hand have tightened up and he has daily spasms in his R hemibody.  Pt states that he is having the most difficulty with brushing his teeth, shaving, and walking.  Pt accompanied by: self  PERTINENT HISTORY: PMH: hypertension, hyperlipidemia, chronic kidney disease, prostate cancer  PAIN:  Are you having pain? No  No pain currently but does get pain in his R arm at times.  PRECAUTIONS: Fall  RED FLAGS: None   WEIGHT BEARING RESTRICTIONS: No  FALLS: Has patient fallen in last 6 months? Yes. 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  PLOF: Independent with gait, Independent with transfers, and Requires assistive device for independence  PATIENT GOALS: "hopefully get more feeling back into my leg and my arm"  OBJECTIVE:   DIAGNOSTIC FINDINGS: None relevant to this POC (no new imaging since CVA in Jan 2024)  COGNITION: Overall cognitive status: Within functional limits for tasks assessed   SENSATION: Pt reports ongoing N/T in R hemibody Light touch appears intact  POSTURE: rounded shoulders, forward head, and posterior pelvic tilt  LOWER EXTREMITY ROM:     Active  Right Eval Left Eval  Hip flexion    Hip extension    Hip abduction    Hip adduction    Hip  internal rotation    Hip external rotation    Knee flexion    Knee extension Tight HS   Ankle dorsiflexion    Ankle plantarflexion    Ankle inversion    Ankle eversion     (Blank rows = not tested)  LOWER EXTREMITY MMT:    MMT Right Eval Left Eval  Hip flexion 3 4+  Hip extension    Hip abduction    Hip adduction    Hip internal rotation    Hip external rotation    Knee flexion 2 4+  Knee extension 3 4+  Ankle dorsiflexion 5 5  Ankle plantarflexion    Ankle inversion    Ankle eversion    (Blank rows = not tested)  BED MOBILITY:  Mod I per patient report  TRANSFERS: Assistive device utilized: None  Sit to stand: Modified independence Stand to sit: Modified independence Chair to chair: Modified independence Floor:  not assessed at eval   GAIT: Gait pattern: decreased step length- Right, decreased hip/knee flexion- Right, decreased ankle dorsiflexion- Right, and poor foot clearance- Right Distance walked: various clinic distances Assistive device utilized: Quad cane small base Level of assistance: progression from SBA to CGA Comments: increase in RLE impairments as gait progresses, catches R toes on the floor and needs increased guarding due to impaired balance   FUNCTIONAL TESTS:    [facility] PT Assessment - 04/03/23 1507       Ambulation/Gait   Gait velocity 32.8 ft over 25.72 sec = 1.28 ft/sec      6 minute walk test results    Aerobic Endurance Distance Walked 581    Endurance additional comments RPE 6/10      Standardized Balance Assessment   Standardized Balance Assessment Timed Up and Go Test;Five Times Sit to Stand    Five times sit to stand comments  28.19 sec   no UE     Timed Up and Go Test  TUG Normal TUG    Normal TUG (seconds) 17.88   with SBQC              TODAY'S TREATMENT:                                                                                                                              PT Evaluation  Vitals:    04/03/23 1453  BP: (!) 145/74  Pulse: 82      PATIENT EDUCATION: Education details: Eval findings, results of OM and functional implications, PT POC Person educated: Patient Education method: Explanation Education comprehension: verbalized understanding and needs further education  HOME EXERCISE PROGRAM: To be reviewed from previous POC and/or initiated a new one  GOALS: Goals reviewed with patient? Yes  SHORT TERM GOALS: Target date: 04/24/2023   Pt will be independent with initial HEP for improved strength, balance, transfers and gait. Baseline: Goal status: INITIAL  2.  Pt will improve 5 x STS to less than or equal to 23 seconds to demonstrate improved functional strength and transfer efficiency.  Baseline: 28.19 sec no UE (9/25) Goal status: INITIAL  3.  Pt will improve gait velocity to at least 1.5 ft/sec for improved gait efficiency and performance at SBA level  Baseline: 1.28 ft/sec with [facility] and SBA (9/25) Goal status: INITIAL  4.  Pt will ambulate greater than or equal to 750 feet on with LRAD and SBA for improved cardiovascular endurance and BLE strength.  Baseline: 581 ft with [facility] and CGA (9/25) Goal status: INITIAL  5.  FGA to be assessed and STG set Baseline:  Goal status: INITIAL   LONG TERM GOALS: Target date: 05/23/2023   Pt will be independent with final HEP for improved strength, balance, transfers and gait. Baseline:  Goal status: INITIAL  2.  Pt will improve 5 x STS to less than or equal to 18 seconds to demonstrate improved functional strength and transfer efficiency.  Baseline: 28.19 sec no UE (9/25) Goal status: INITIAL  3.  Pt will improve gait velocity to at least 1.75 ft/sec for improved gait efficiency and performance at SBA level  Baseline: 1.28 ft/sec with [facility] and SBA (9/25) Goal status: INITIAL  4.  Pt will improve normal TUG to less than or equal to 15 seconds for improved functional mobility and decreased fall  risk. Baseline: 17.88 sec with SBQC (9/25) Goal status: INITIAL  5.  Pt will ambulate greater than or equal to 1000 feet on with LRAD and SBA for improved cardiovascular endurance and BLE strength.  Baseline: 581 ft with [facility] and CGA (9/25)  6.  FGA to be assessed and LTG set Baseline:  Goal status: INITIAL  ASSESSMENT:  CLINICAL IMPRESSION: Patient is a 72 year old male referred to Neuro OPPT for R hemibody weakness due to CVA in Jan 2024.   Pt's PMH is significant for: hypertension, hyperlipidemia, chronic kidney disease, prostate cancer. The following deficits were  present during the exam: decreased RLE strength and ROM, impaired balance, and impaired endurance. Based on his 5xSTS score, gait speed, and TUG score, pt is an increased risk for falls. Pt would benefit from skilled PT to address these impairments and functional limitations to maximize functional mobility independence.   OBJECTIVE IMPAIRMENTS: Abnormal gait, cardiopulmonary status limiting activity, decreased activity tolerance, decreased balance, decreased endurance, decreased knowledge of use of DME, difficulty walking, decreased ROM, decreased strength, impaired perceived functional ability, increased muscle spasms, impaired UE functional use, and pain.   ACTIVITY LIMITATIONS: carrying, lifting, bending, standing, squatting, stairs, and transfers  PARTICIPATION LIMITATIONS: driving and community activity  PERSONAL FACTORS: Age, Time since onset of injury/illness/exacerbation, and 3+ comorbidities:    hypertension, hyperlipidemia, chronic kidney disease, prostate cancerare also affecting patient's functional outcome.   REHAB POTENTIAL: Good  CLINICAL DECISION MAKING: Stable/uncomplicated  EVALUATION COMPLEXITY: Low  PLAN:  PT FREQUENCY: 2x/week  PT DURATION: 6 weeks  PLANNED INTERVENTIONS: Therapeutic exercises, Therapeutic activity, Neuromuscular re-education, Balance training, Gait training, Patient/Family  education, Self Care, Joint mobilization, Stair training, Vestibular training, Canalith repositioning, Visual/preceptual remediation/compensation, Orthotic/Fit training, DME instructions, Aquatic Therapy, Dry Needling, Electrical stimulation, Cryotherapy, Moist heat, Taping, Manual therapy, and Re-evaluation  PLAN FOR NEXT SESSION: assess FGA and set STG/LTG, review prior HEP or create new HEP to work on RLE [employer], strengthening, endurance; did we get OT referral?    Peter Congo, PT, DPT, CSRS  04/03/2023, 3:26 PM

## 2023-04-10 ENCOUNTER — Ambulatory Visit: Payer: Medicare Other | Attending: Adult Health | Admitting: Physical Therapy

## 2023-04-10 VITALS — BP 140/79 | HR 69

## 2023-04-10 DIAGNOSIS — I69351 Hemiplegia and hemiparesis following cerebral infarction affecting right dominant side: Secondary | ICD-10-CM | POA: Diagnosis present

## 2023-04-10 DIAGNOSIS — R208 Other disturbances of skin sensation: Secondary | ICD-10-CM | POA: Insufficient documentation

## 2023-04-10 DIAGNOSIS — R278 Other lack of coordination: Secondary | ICD-10-CM | POA: Insufficient documentation

## 2023-04-10 DIAGNOSIS — R2689 Other abnormalities of gait and mobility: Secondary | ICD-10-CM | POA: Diagnosis present

## 2023-04-10 DIAGNOSIS — R2681 Unsteadiness on feet: Secondary | ICD-10-CM | POA: Diagnosis present

## 2023-04-10 DIAGNOSIS — M6281 Muscle weakness (generalized): Secondary | ICD-10-CM | POA: Diagnosis present

## 2023-04-10 NOTE — Therapy (Signed)
OUTPATIENT PHYSICAL THERAPY NEURO TREATMENT   Patient Name: Jerome Cardenas MRN: [id number] DOB:07/18/1950, 72 y.o., male Today's Date: 04/10/2023   PCP: Kerin Salen, PA-C REFERRING PROVIDER: Butch Penny, NP  END OF SESSION:  PT End of Session - 04/10/23 1032     Visit Number 2    Number of Visits 13   with eval   Date for PT Re-Evaluation 05/29/23   to allow for scheduling delays   Authorization Type Medicare    PT Start Time 1030    PT Stop Time 1116    PT Time Calculation (min) 46 min    Equipment Utilized During Treatment Gait belt    Activity Tolerance Patient tolerated treatment well    Behavior During Therapy WFL for tasks assessed/performed              Past Medical History:  Diagnosis Date   Cancer (HCC)    prostate   CKD (chronic kidney disease) stage 2, GFR 60-89 ml/min 07/28/2022   HLD (hyperlipidemia)    Hypertension    Past Surgical History:  Procedure Laterality Date   APPENDECTOMY     CHOLECYSTECTOMY     HERNIA REPAIR     PROSTATE SURGERY     prostate cancer 2014   Patient Active Problem List   Diagnosis Date Noted   Diabetes mellitus type 2 with neurological manifestations (HCC) 07/29/2022   Hypertension associated with diabetes (HCC) 07/29/2022   Hyperlipidemia associated with type 2 diabetes mellitus (HCC) 07/29/2022   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    ONSET DATE: 03/26/2023  REFERRING DIAG:  Diagnosis  I63.81 (ICD-10-CM) - Left thalamic infarction (HCC)  I69.398,R26.9 (ICD-10-CM) - Abnormality of gait as late effect of stroke    THERAPY DIAG:  Muscle weakness (generalized)  Unsteadiness on feet  Other abnormalities of gait and mobility  Rationale for Evaluation and Treatment: Rehabilitation  SUBJECTIVE:                                                                                                                                                                                             SUBJECTIVE  STATEMENT: Pt presents to clinic w/SBQC. Rating fatigue as a 10/10. Denies pain. Started noticing that his right arm and leg were getting weaker about a month ago. Denies pain in the arm or leg, just feels numb.   Pt accompanied by: self  PERTINENT HISTORY: PMH: hypertension, hyperlipidemia, chronic kidney disease, prostate cancer  PAIN:  Are you having pain? No  No pain currently but does get pain in his R arm at times.  PRECAUTIONS: Fall  RED FLAGS: None  WEIGHT BEARING RESTRICTIONS: No  FALLS: Has patient fallen in last 6 months? Yes. 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  PLOF: Independent with gait, Independent with transfers, and Requires assistive device for independence  PATIENT GOALS: "hopefully get more feeling back into my leg and my arm"  OBJECTIVE:   DIAGNOSTIC FINDINGS: None relevant to this POC (no new imaging since CVA in Jan 2024)  COGNITION: Overall cognitive status: Within functional limits for tasks assessed   SENSATION: Pt reports ongoing N/T in R hemibody Light touch appears intact  POSTURE: rounded shoulders, forward head, and posterior pelvic tilt  LOWER EXTREMITY ROM:     Active  Right Eval Left Eval  Hip flexion    Hip extension    Hip abduction    Hip adduction    Hip internal rotation    Hip external rotation    Knee flexion    Knee extension Tight HS   Ankle dorsiflexion    Ankle plantarflexion    Ankle inversion    Ankle eversion     (Blank rows = not tested)  LOWER EXTREMITY MMT:    MMT Right Eval Left Eval  Hip flexion 3 4+  Hip extension    Hip abduction    Hip adduction    Hip internal rotation    Hip external rotation    Knee flexion 2 4+  Knee extension 3 4+  Ankle  dorsiflexion 5 5  Ankle plantarflexion    Ankle inversion    Ankle eversion    (Blank rows = not tested)  VITALS  Vitals:   04/10/23 1041  BP: (!) 140/79  Pulse: 69     TRANSFERS: Assistive device utilized: None  Sit to stand: Modified independence Stand to sit: Modified independence Chair to chair: Modified independence   GAIT: Gait pattern: decreased arm swing- Right, decreased step length- Right, decreased hip/knee flexion- Right, decreased ankle dorsiflexion- Right, shuffling, decreased trunk rotation, trunk flexed, and poor foot clearance- Right Distance walked: various clinic distances Assistive device utilized: Quad cane small base Level of assistance: SBA Comments: No catching of R foot noted this date but pt does exhibit shuffling of gait w/decreased clearance of R foot > L. Also noted posterior lean preference throughout session. Pt maintains R hand in fist w/gait   TODAY'S TREATMENT:   Ther Act   [facility] PT Assessment - 04/10/23 1048       Functional Gait  Assessment   Gait assessed  Yes    Gait Level Surface Walks 20 ft, slow speed, abnormal gait pattern, evidence for imbalance or deviates 10-15 in outside of the 12 in walkway width. Requires more than 7 sec to ambulate 20 ft.   10.63s   Change in Gait Speed Makes only minor adjustments to walking speed, or accomplishes a change in speed with significant gait deviations, deviates 10-15 in outside the 12 in walkway width, or changes speed but loses balance but is able to recover and continue walking.    Gait with Horizontal Head Turns Performs head turns smoothly with slight change in gait velocity (eg, minor disruption to smooth gait path), deviates 6-10 in outside 12 in walkway width, or uses an assistive device.    Gait with Vertical Head Turns Performs  head turns with no change in gait. Deviates no more than 6 in outside 12 in walkway width.    Gait and Pivot Turn Pivot turns safely within 3 sec and stops quickly  with no loss of balance.    Step Over Obstacle Is able to step over one shoe box (4.5 in total height) without changing gait speed. No evidence of imbalance.    Gait with Narrow Base of Support Ambulates less than 4 steps heel to toe or cannot perform without assistance.    Gait with Eyes Closed Cannot walk 20 ft without assistance, severe gait deviations or imbalance, deviates greater than 15 in outside 12 in walkway width or will not attempt task.   Deviation to R side   Ambulating Backwards Walks 20 ft, slow speed, abnormal gait pattern, evidence for imbalance, deviates 10-15 in outside 12 in walkway width.   17.85s   Steps Alternating feet, must use rail.    Total Score 15    FGA comment: High fall risk             Ther Ex  Reviewed HEP from previous POC and updated as appropriate. Only reviewed sit to stands this date and pt able to perform 5 reps w/good form and no instability, much faster than on eval.     PATIENT EDUCATION: Education details: FGA outcome, review of HEP Person educated: Patient Education method: Explanation, Demonstration, and Handouts Education comprehension: verbalized understanding, returned demonstration, and needs further education  HOME EXERCISE PROGRAM: From Previous POC: Access Code: [email or web address] URL: [email or web address] Date: 04/10/2023 Prepared by: Alethia Berthold Aneri Slagel  Exercises - Feet together with eyes closed and head turns   - 1 x daily - 7 x weekly - 3-4 reps - 20-30 second hold - Tandem Stance in Corner  - 1 x daily - 7 x weekly - 3-4 reps - 15-30 second  hold - Sit to Stand Without Arm Support  - 1 x daily - 7 x weekly - 3 sets - 10 reps  GOALS: Goals reviewed with patient? Yes  SHORT TERM GOALS: Target date: 04/24/2023   Pt will be independent with initial HEP for improved strength, balance, transfers and gait. Baseline: Goal status: INITIAL  2.  Pt will improve 5 x STS to less than or equal to 23 seconds to demonstrate  improved functional strength and transfer efficiency.  Baseline: 28.19 sec no UE (9/25) Goal status: INITIAL  3.  Pt will improve gait velocity to at least 1.5 ft/sec for improved gait efficiency and performance at SBA level  Baseline: 1.28 ft/sec with [facility] and SBA (9/25) Goal status: INITIAL  4.  Pt will ambulate greater than or equal to 750 feet on with LRAD and SBA for improved cardiovascular endurance and BLE strength.  Baseline: 581 ft with [facility] and CGA (9/25) Goal status: INITIAL  5.  Pt will improve FGA to 19/30 for decreased fall risk   Baseline: 15/30 Goal status: REVISED    LONG TERM GOALS: Target date: 05/23/2023   Pt will be independent with final HEP for improved strength, balance, transfers and gait. Baseline:  Goal status: INITIAL  2.  Pt will improve 5 x STS to less than or equal to 18 seconds to demonstrate improved functional strength and transfer efficiency.  Baseline: 28.19 sec no UE (9/25) Goal status: INITIAL  3.  Pt will improve gait velocity to at least 1.75 ft/sec for improved gait efficiency and performance at SBA level  Baseline: 1.28 ft/sec with [facility]  and SBA (9/25) Goal status: INITIAL  4.  Pt will improve normal TUG to less than or equal to 15 seconds for improved functional mobility and decreased fall risk. Baseline: 17.88 sec with SBQC (9/25) Goal status: INITIAL  5.  Pt will ambulate greater than or equal to 1000 feet on with LRAD and SBA for improved cardiovascular endurance and BLE strength.  Baseline: 581 ft with [facility] and CGA (9/25)  6.  Pt will improve FGA to 24/30 for decreased fall risk   Baseline: 15/30 Goal status: REVISED   ASSESSMENT:  CLINICAL IMPRESSION: Emphasis of skilled PT session on assessing dynamic balance via FGA and reviewing HEP from previous POC. Pt scored a 15/30 on FGA, indicative of high fall risk. Pt demonstrates most significant decline w/variable gait speed, stepping over obstacles and gait w/EC  from previous POC. Pt reports he does not have difficulty w/HEP despite having challenging balance tasks, so will need to review next session. Added sit to stands to HEP due to reports of leg weakness and difficulty w/task on eval, but pt demonstrated activity w/no issues today. Continue POC.    OBJECTIVE IMPAIRMENTS: Abnormal gait, cardiopulmonary status limiting activity, decreased activity tolerance, decreased balance, decreased endurance, decreased knowledge of use of DME, difficulty walking, decreased ROM, decreased strength, impaired perceived functional ability, increased muscle spasms, impaired UE functional use, and pain.   ACTIVITY LIMITATIONS: carrying, lifting, bending, standing, squatting, stairs, and transfers  PARTICIPATION LIMITATIONS: driving and community activity  PERSONAL FACTORS: Age, Time since onset of injury/illness/exacerbation, and 3+ comorbidities:    hypertension, hyperlipidemia, chronic kidney disease, prostate cancerare also affecting patient's functional outcome.   REHAB POTENTIAL: Good  CLINICAL DECISION MAKING: Stable/uncomplicated  EVALUATION COMPLEXITY: Low  PLAN:  PT FREQUENCY: 2x/week  PT DURATION: 6 weeks  PLANNED INTERVENTIONS: Therapeutic exercises, Therapeutic activity, Neuromuscular re-education, Balance training, Gait training, Patient/Family education, Self Care, Joint mobilization, Stair training, Vestibular training, Canalith repositioning, Visual/preceptual remediation/compensation, Orthotic/Fit training, DME instructions, Aquatic Therapy, Dry Needling, Electrical stimulation, Cryotherapy, Moist heat, Taping, Manual therapy, and Re-evaluation  PLAN FOR NEXT SESSION: review prior HEP or create new HEP to work on RLE NMR, strengthening, endurance. Postural control     Josephine Igo, PT, DPT [facility] [address] [place], [name]  [address] Phone:  [phone] Fax:  [phone]  04/10/2023, 11:23  AM

## 2023-04-12 ENCOUNTER — Ambulatory Visit: Payer: Medicare Other | Admitting: Physical Therapy

## 2023-04-12 VITALS — BP 129/80 | HR 74

## 2023-04-12 DIAGNOSIS — M6281 Muscle weakness (generalized): Secondary | ICD-10-CM

## 2023-04-12 DIAGNOSIS — R2689 Other abnormalities of gait and mobility: Secondary | ICD-10-CM

## 2023-04-12 DIAGNOSIS — R2681 Unsteadiness on feet: Secondary | ICD-10-CM

## 2023-04-12 NOTE — Therapy (Signed)
OUTPATIENT PHYSICAL THERAPY NEURO TREATMENT   Patient Name: Jerome Cardenas MRN: [id number] DOB:1950/09/16, 72 y.o., male Today's Date: 04/12/2023   PCP: Kerin Salen, PA-C REFERRING PROVIDER: Butch Penny, NP  END OF SESSION:  PT End of Session - 04/12/23 1022     Visit Number 3    Number of Visits 13   with eval   Date for PT Re-Evaluation 05/29/23   to allow for scheduling delays   Authorization Type Medicare    PT Start Time 1020   Pt arrived late   PT Stop Time 1059    PT Time Calculation (min) 39 min    Equipment Utilized During Treatment Gait belt    Activity Tolerance Patient tolerated treatment well    Behavior During Therapy WFL for tasks assessed/performed              Past Medical History:  Diagnosis Date   Cancer (HCC)    prostate   CKD (chronic kidney disease) stage 2, GFR 60-89 ml/min 07/28/2022   HLD (hyperlipidemia)    Hypertension    Past Surgical History:  Procedure Laterality Date   APPENDECTOMY     CHOLECYSTECTOMY     HERNIA REPAIR     PROSTATE SURGERY     prostate cancer 2014   Patient Active Problem List   Diagnosis Date Noted   Diabetes mellitus type 2 with neurological manifestations (HCC) 07/29/2022   Hypertension associated with diabetes (HCC) 07/29/2022   Hyperlipidemia associated with type 2 diabetes mellitus (HCC) 07/29/2022   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    ONSET DATE: 03/26/2023  REFERRING DIAG:  Diagnosis  I63.81 (ICD-10-CM) - Left thalamic infarction (HCC)  I69.398,R26.9 (ICD-10-CM) - Abnormality of gait as late effect of stroke    THERAPY DIAG:  Muscle weakness (generalized)  Unsteadiness on feet  Other abnormalities of gait and mobility  Rationale for Evaluation and Treatment: Rehabilitation  SUBJECTIVE:                                                                                                                                                                                              SUBJECTIVE STATEMENT: Pt presents to clinic w/SBQC. Denies acute changes or pain.   Pt accompanied by: self  PERTINENT HISTORY: PMH: hypertension, hyperlipidemia, chronic kidney disease, prostate cancer  PAIN:  Are you having pain? No  No pain currently but does get pain in his R arm at times.  PRECAUTIONS: Fall  RED FLAGS: None   WEIGHT BEARING RESTRICTIONS: No  FALLS: Has patient fallen in last 6 months? Yes. Number of falls 1 (a few days ago [place])  LIVING ENVIRONMENT: Lives with: lives with their family (wife) Lives in: House/apartment Stairs: Yes: Internal: 12 steps; on right going up Has following equipment at home: Retail banker - 2 wheeled  PLOF: Independent with gait, Independent with transfers, and Requires assistive device for independence  PATIENT GOALS: "hopefully get more feeling back into my leg and my arm"  OBJECTIVE:   DIAGNOSTIC FINDINGS: None relevant to this POC (no new imaging since CVA in Jan 2024)  COGNITION: Overall cognitive status: Within functional limits for tasks assessed   SENSATION: Pt reports ongoing N/T in R hemibody Light touch appears intact  POSTURE: rounded shoulders, forward head, and posterior pelvic tilt  LOWER EXTREMITY ROM:     Active  Right Eval Left Eval  Hip flexion    Hip extension    Hip abduction    Hip adduction    Hip internal rotation    Hip external rotation    Knee flexion    Knee extension Tight HS   Ankle dorsiflexion    Ankle plantarflexion    Ankle inversion    Ankle eversion     (Blank rows = not tested)  LOWER EXTREMITY MMT:    MMT Right Eval Left Eval  Hip flexion 3 4+  Hip extension    Hip abduction    Hip adduction    Hip internal rotation    Hip external rotation    Knee flexion 2 4+  Knee extension 3 4+  Ankle dorsiflexion 5 5  Ankle plantarflexion    Ankle inversion    Ankle eversion    (Blank rows = not tested)  VITALS  Vitals:    04/12/23 1024  BP: 129/80  Pulse: 74      TRANSFERS: Assistive device utilized: None  Sit to stand: Modified independence Stand to sit: Modified independence Chair to chair: Modified independence   GAIT: Gait pattern: decreased arm swing- Right, decreased step length- Right, decreased hip/knee flexion- Right, decreased ankle dorsiflexion- Right, shuffling, decreased trunk rotation, trunk flexed, and poor foot clearance- Right Distance walked: various clinic distances Assistive device utilized: Quad cane small base and None Level of assistance: Modified independence Comments: No catching of R foot noted this date. Pt ambulated throughout session w/o AD mod I   TODAY'S TREATMENT:   Ther Act  Assessed vitals (see above) and WNL for therapy  Pt reports his feet have been hurting him more. Encouraged him to go to [employer] and do foot scan and obtain more supportive shoes, especially w/[place] trip coming up. Pt verbalized understanding.    Ther Ex  Reviewed HEP from previous POC in corner:  Romberg stance w/EC and horizontal/vertical head turns, x1 minute each direction. Min L/R postural sway noted but no LOB Tandem stance, x1 minute each side. Pt required extra time to find balanced position but was able to hold for a full minute w/o LOB  On rockerboard in A/P direction (w/back to corner and sturdy chair in front of pt) for improved postural control, reaching out of BOS and reactive balance strategies. Pt required CGA-min A throughout:  Standing w/EO x2 minutes, minor A/P sway noted but no LOB Standing w/EC x2 minutes, pt relying heavily on hip strategy and lost balance anteriorly> posteriorly  [employer] on board w/Jerome Cardenas, x4 minutes. Pt required min A throughout due to LOB in both anterior and posterior direction. Pt required min cues to regain balance prior to catching ball, as pt only  focused on ball  Pt rated pain in feet as 10/10 following session   PATIENT  EDUCATION: Education details: Continue HEP, go to [employer] to get new shoes  Person educated: Patient Education method: Explanation, Demonstration, and Handouts Education comprehension: verbalized understanding, returned demonstration, and needs further education  HOME EXERCISE PROGRAM: From Previous POC: Access Code: [email or web address] URL: [email or web address] Date: 04/10/2023 Prepared by: Alethia Berthold Hanh Kertesz  Exercises - Feet together with eyes closed and head turns   - 1 x daily - 7 x weekly - 3-4 reps - 20-30 second hold - Tandem Stance in Corner  - 1 x daily - 7 x weekly - 3-4 reps - 15-30 second  hold - Sit to Stand Without Arm Support  - 1 x daily - 7 x weekly - 3 sets - 10 reps  GOALS: Goals reviewed with patient? Yes  SHORT TERM GOALS: Target date: 04/24/2023   Pt will be independent with initial HEP for improved strength, balance, transfers and gait. Baseline: Goal status: INITIAL  2.  Pt will improve 5 x STS to less than or equal to 23 seconds to demonstrate improved functional strength and transfer efficiency.  Baseline: 28.19 sec no UE (9/25) Goal status: INITIAL  3.  Pt will improve gait velocity to at least 1.5 ft/sec for improved gait efficiency and performance at SBA level  Baseline: 1.28 ft/sec with [facility] and SBA (9/25) Goal status: INITIAL  4.  Pt will ambulate greater than or equal to 750 feet on with LRAD and SBA for improved cardiovascular endurance and BLE strength.  Baseline: 581 ft with [employer] and CGA (9/25) Goal status: INITIAL  5.  Pt will improve FGA to 19/30 for decreased fall risk   Baseline: 15/30 Goal status: REVISED    LONG TERM GOALS: Target date: 05/23/2023   Pt will be independent with final HEP for improved strength, balance, transfers and gait. Baseline:  Goal status: INITIAL  2.  Pt will improve 5 x STS to less than or equal to 18 seconds to demonstrate improved functional strength and transfer efficiency.  Baseline:  28.19 sec no UE (9/25) Goal status: INITIAL  3.  Pt will improve gait velocity to at least 1.75 ft/sec for improved gait efficiency and performance at SBA level  Baseline: 1.28 ft/sec with [facility] and SBA (9/25) Goal status: INITIAL  4.  Pt will improve normal TUG to less than or equal to 15 seconds for improved functional mobility and decreased fall risk. Baseline: 17.88 sec with SBQC (9/25) Goal status: INITIAL  5.  Pt will ambulate greater than or equal to 1000 feet on with LRAD and SBA for improved cardiovascular endurance and BLE strength.  Baseline: 581 ft with [facility] and CGA (9/25)  6.  Pt will improve FGA to 24/30 for decreased fall risk   Baseline: 15/30 Goal status: REVISED   ASSESSMENT:  CLINICAL IMPRESSION: Emphasis of skilled PT session on reactive balance strategies, postural control and global strength. Pt limited by bilateral foot pain during session, which pt describes as more of a lack of support in his shoes rather than nerve pain. Pt ambulating well today, w/no dragging of RLE or imbalance. Pt reports he felt good today and is unsure why he has fluctuations in balance. Continue POC.    OBJECTIVE IMPAIRMENTS: Abnormal gait, cardiopulmonary status limiting activity, decreased activity tolerance, decreased balance, decreased endurance, decreased knowledge of use of DME, difficulty walking, decreased ROM, decreased strength, impaired perceived functional ability, increased muscle spasms, impaired UE  functional use, and pain.   ACTIVITY LIMITATIONS: carrying, lifting, bending, standing, squatting, stairs, and transfers  PARTICIPATION LIMITATIONS: driving and community activity  PERSONAL FACTORS: Age, Time since onset of injury/illness/exacerbation, and 3+ comorbidities:    hypertension, hyperlipidemia, chronic kidney disease, prostate cancerare also affecting patient's functional outcome.   REHAB POTENTIAL: Good  CLINICAL DECISION MAKING:  Stable/uncomplicated  EVALUATION COMPLEXITY: Low  PLAN:  PT FREQUENCY: 2x/week  PT DURATION: 6 weeks  PLANNED INTERVENTIONS: Therapeutic exercises, Therapeutic activity, Neuromuscular re-education, Balance training, Gait training, Patient/Family education, Self Care, Joint mobilization, Stair training, Vestibular training, Canalith repositioning, Visual/preceptual remediation/compensation, Orthotic/Fit training, DME instructions, Aquatic Therapy, Dry Needling, Electrical stimulation, Cryotherapy, Moist heat, Taping, Manual therapy, and Re-evaluation  PLAN FOR NEXT SESSION: review prior HEP or create new HEP to work on RLE NMR, strengthening, endurance. Postural control     Josephine Igo, PT, DPT [facility] [address] [place], [name]  [address] Phone:  [phone] Fax:  [phone]  04/12/2023, 11:47 AM

## 2023-04-16 ENCOUNTER — Ambulatory Visit: Payer: Medicare Other | Admitting: Physical Therapy

## 2023-04-16 VITALS — BP 153/82 | HR 76

## 2023-04-16 DIAGNOSIS — R2689 Other abnormalities of gait and mobility: Secondary | ICD-10-CM

## 2023-04-16 DIAGNOSIS — M6281 Muscle weakness (generalized): Secondary | ICD-10-CM | POA: Diagnosis not present

## 2023-04-16 DIAGNOSIS — R2681 Unsteadiness on feet: Secondary | ICD-10-CM

## 2023-04-16 NOTE — Therapy (Addendum)
OUTPATIENT PHYSICAL THERAPY NEURO TREATMENT   Patient Name: Jerome Cardenas MRN: [id number] DOB:[birth year], 72 y.o., male Today's Date: 04/16/2023   PCP: Kerin Salen, PA-C REFERRING PROVIDER: Butch Penny, NP  END OF SESSION:  PT End of Session - 04/16/23 1048     Visit Number 4    Number of Visits 13   with eval   Date for PT Re-Evaluation 05/29/23   to allow for scheduling delays   Authorization Type Medicare    PT Start Time 1045    PT Stop Time 1130    PT Time Calculation (min) 45 min    Equipment Utilized During Treatment Gait belt    Activity Tolerance Patient tolerated treatment well    Behavior During Therapy [facility] for tasks assessed/performed               Past Medical History:  Diagnosis Date   Cancer (HCC)    prostate   CKD (chronic kidney disease) stage 2, GFR 60-89 ml/min 07/28/2022   HLD (hyperlipidemia)    Hypertension    Past Surgical History:  Procedure Laterality Date   APPENDECTOMY     CHOLECYSTECTOMY     HERNIA REPAIR     PROSTATE SURGERY     prostate cancer 2014   Patient Active Problem List   Diagnosis Date Noted   Diabetes mellitus type 2 with neurological manifestations (HCC) 07/29/2022   Hypertension associated with diabetes (HCC) 07/29/2022   Hyperlipidemia associated with type 2 diabetes mellitus (HCC) 07/29/2022   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    ONSET DATE: 03/26/2023  REFERRING DIAG:  Diagnosis  I63.81 (ICD-10-CM) - Left thalamic infarction (HCC)  I69.398,R26.9 (ICD-10-CM) - Abnormality of gait as late effect of stroke    THERAPY DIAG:  Muscle weakness (generalized)  Unsteadiness on feet  Other abnormalities of gait and mobility  Rationale for Evaluation and Treatment: Rehabilitation  SUBJECTIVE:                                                                                                                                                                                             SUBJECTIVE  STATEMENT: Pt presents to clinic w/SBQC and new tennis shoes. Denies pain or falls. Saw the doctor yesterday and was given a new medication that may assist with sleep.   Pt accompanied by: self  PERTINENT HISTORY: PMH: hypertension, hyperlipidemia, chronic kidney disease, prostate cancer  PAIN:  Are you having pain? No  No pain currently but does get pain in his R arm at times.  PRECAUTIONS: Fall  RED FLAGS: None   WEIGHT BEARING RESTRICTIONS: No  FALLS: Has patient  fallen in last 6 months? Yes. 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  PLOF: Independent with gait, Independent with transfers, and Requires assistive device for independence  PATIENT GOALS: "hopefully get more feeling back into my leg and my arm"  OBJECTIVE:   DIAGNOSTIC FINDINGS: None relevant to this POC (no new imaging since CVA in Jan 2024)  COGNITION: Overall cognitive status: Within functional limits for tasks assessed   SENSATION: Pt reports ongoing N/T in R hemibody Light touch appears intact  POSTURE: rounded shoulders, forward head, and posterior pelvic tilt  LOWER EXTREMITY ROM:     Active  Right Eval Left Eval  Hip flexion    Hip extension    Hip abduction    Hip adduction    Hip internal rotation    Hip external rotation    Knee flexion    Knee extension Tight HS   Ankle dorsiflexion    Ankle plantarflexion    Ankle inversion    Ankle eversion     (Blank rows = not tested)  LOWER EXTREMITY MMT:    MMT Right Eval Left Eval  Hip flexion 3 4+  Hip extension    Hip abduction    Hip adduction    Hip internal rotation    Hip external rotation    Knee flexion 2 4+  Knee extension 3 4+  Ankle dorsiflexion 5 5  Ankle plantarflexion     Ankle inversion    Ankle eversion    (Blank rows = not tested)  VITALS  Vitals:   04/16/23 1057  BP: (!) 153/82  Pulse: 76       TRANSFERS: Assistive device utilized: None  Sit to stand: Modified independence Stand to sit: Modified independence Chair to chair: Modified independence   GAIT: Gait pattern: decreased arm swing- Right, decreased step length- Right, decreased hip/knee flexion- Right, decreased ankle dorsiflexion- Right, shuffling, decreased trunk rotation, trunk flexed, and poor foot clearance- Right Distance walked: various clinic distances Assistive device utilized: Quad cane small base and None Level of assistance: Modified independence Comments: No catching of R foot noted this date. Pt ambulated throughout session w/o AD mod I and carried cane out of clinic despite reporting his RLE was "wobbly"   TODAY'S TREATMENT:   Ther Act  Assessed vitals (see above) and WNL for therapy  Discussed changes in function that this therapist and others have noticed (hypophonia, tremors in RLE, retropulsion and bradykinesia). Pt is not a great historian and is difficult to obtain answers from regarding his functional mobility and pain. Pt approved having therapist message Butch Penny to discuss concerns.    Ther Ex  Staggered squats (4" step under single LE) x10 reps per side for improved BLE strength. Increased difficulty performing last 2-3 reps due to BLE weakness  Goblet squats (12# KB) w/single leg on green dynadisc, x8 reps per side. Pt very challenged by this due to quad weakness but no instability noted. Increased difficulty w/LLE elevated on dynadisc. CGA throughout    PATIENT EDUCATION: Education details: Continue HEP  Person educated: Patient Education method: Programmer, multimedia, Facilities manager, and Handouts Education comprehension: verbalized understanding, returned demonstration, and needs further education  HOME EXERCISE PROGRAM: From Previous POC: Access  Code: [email or web address] URL: [email or web address] Date: 04/10/2023 Prepared  by: Alethia Berthold Lashell Moffitt  Exercises - Feet together with eyes closed and head turns   - 1 x daily - 7 x weekly - 3-4 reps - 20-30 second hold - Tandem Stance in Corner  - 1 x daily - 7 x weekly - 3-4 reps - 15-30 second  hold - Sit to Stand Without Arm Support  - 1 x daily - 7 x weekly - 3 sets - 10 reps  GOALS: Goals reviewed with patient? Yes  SHORT TERM GOALS: Target date: 04/24/2023   Pt will be independent with initial HEP for improved strength, balance, transfers and gait. Baseline: Goal status: INITIAL  2.  Pt will improve 5 x STS to less than or equal to 23 seconds to demonstrate improved functional strength and transfer efficiency.  Baseline: 28.19 sec no UE (9/25) Goal status: INITIAL  3.  Pt will improve gait velocity to at least 1.5 ft/sec for improved gait efficiency and performance at SBA level  Baseline: 1.28 ft/sec with [facility] and SBA (9/25) Goal status: INITIAL  4.  Pt will ambulate greater than or equal to 750 feet on with LRAD and SBA for improved cardiovascular endurance and BLE strength.  Baseline: 581 ft with [facility] and CGA (9/25) Goal status: INITIAL  5.  Pt will improve FGA to 19/30 for decreased fall risk   Baseline: 15/30 Goal status: REVISED    LONG TERM GOALS: Target date: 05/23/2023   Pt will be independent with final HEP for improved strength, balance, transfers and gait. Baseline:  Goal status: INITIAL  2.  Pt will improve 5 x STS to less than or equal to 18 seconds to demonstrate improved functional strength and transfer efficiency.  Baseline: 28.19 sec no UE (9/25) Goal status: INITIAL  3.  Pt will improve gait velocity to at least 1.75 ft/sec for improved gait efficiency and performance at SBA level  Baseline: 1.28 ft/sec with [facility] and SBA (9/25) Goal status: INITIAL  4.  Pt will improve normal TUG to less than or equal to 15 seconds for improved  functional mobility and decreased fall risk. Baseline: 17.88 sec with SBQC (9/25) Goal status: INITIAL  5.  Pt will ambulate greater than or equal to 1000 feet on with LRAD and SBA for improved cardiovascular endurance and BLE strength.  Baseline: 581 ft with [facility] and CGA (9/25)  6.  Pt will improve FGA to 24/30 for decreased fall risk   Baseline: 15/30 Goal status: REVISED   ASSESSMENT:  CLINICAL IMPRESSION: Emphasis of skilled PT session on single leg stability, pt education and global strength. Pt continues to report conflicting tremors/pain in R hemibody that fluctuates between spasms, restless leg syndrome and nerve pain. Pt reports Topomax has not been helpful but he has not reached out to [facility] about this. Pt presents w/hypophonia and flat affect this date, which pt reports his wife has noticed as well. Several therapists in agreement that pt is exhibiting Parkinsonian features, but difficult to assess due to history of CVA. Therapist to message neurologist today. Continue POC.    OBJECTIVE IMPAIRMENTS: Abnormal gait, cardiopulmonary status limiting activity, decreased activity tolerance, decreased balance, decreased endurance, decreased knowledge of use of DME, difficulty walking, decreased ROM, decreased strength, impaired perceived functional ability, increased muscle spasms, impaired UE functional use, and pain.   ACTIVITY LIMITATIONS: carrying, lifting, bending, standing, squatting, stairs, and transfers  PARTICIPATION LIMITATIONS: driving and community activity  PERSONAL FACTORS: Age, Time since onset of injury/illness/exacerbation, and 3+ comorbidities:  hypertension, hyperlipidemia, chronic kidney disease, prostate cancerare also affecting patient's functional outcome.   REHAB POTENTIAL: Good  CLINICAL DECISION MAKING: Stable/uncomplicated  EVALUATION COMPLEXITY: Low  PLAN:  PT FREQUENCY: 2x/week  PT DURATION: 6 weeks  PLANNED INTERVENTIONS: Therapeutic  exercises, Therapeutic activity, Neuromuscular re-education, Balance training, Gait training, Patient/Family education, Self Care, Joint mobilization, Stair training, Vestibular training, Canalith repositioning, Visual/preceptual remediation/compensation, Orthotic/Fit training, DME instructions, Aquatic Therapy, Dry Needling, Electrical stimulation, Cryotherapy, Moist heat, Taping, Manual therapy, and Re-evaluation  PLAN FOR NEXT SESSION: review prior HEP or create new HEP to work on RLE NMR, strengthening, endurance. Postural control     Josephine Igo, PT, DPT [facility] [address] [place], [name]  [address] Phone:  [phone] Fax:  [phone]  04/16/2023, 11:38 AM

## 2023-04-18 ENCOUNTER — Ambulatory Visit: Payer: Medicare Other | Admitting: Physical Therapy

## 2023-04-18 ENCOUNTER — Encounter: Payer: Self-pay | Admitting: Physical Therapy

## 2023-04-18 VITALS — BP 126/78 | HR 69

## 2023-04-18 DIAGNOSIS — R2681 Unsteadiness on feet: Secondary | ICD-10-CM

## 2023-04-18 DIAGNOSIS — M6281 Muscle weakness (generalized): Secondary | ICD-10-CM | POA: Diagnosis not present

## 2023-04-18 DIAGNOSIS — R2689 Other abnormalities of gait and mobility: Secondary | ICD-10-CM

## 2023-04-18 NOTE — Therapy (Signed)
OUTPATIENT PHYSICAL THERAPY NEURO TREATMENT   Patient Name: Jerome Cardenas MRN: [id number] DOB:1951/03/02, 72 y.o., male Today's Date: 04/18/2023   PCP: Kerin Salen, PA-C REFERRING PROVIDER: Butch Penny, NP  END OF SESSION:  PT End of Session - 04/18/23 1447     Visit Number 5    Number of Visits 13    Date for PT Re-Evaluation 05/29/23    Authorization Type Medicare    PT Start Time 1448    PT Stop Time 1530    PT Time Calculation (min) 42 min    Equipment Utilized During Treatment Gait belt    Activity Tolerance Patient tolerated treatment well    Behavior During Therapy WFL for tasks assessed/performed               Past Medical History:  Diagnosis Date   Cancer (HCC)    prostate   CKD (chronic kidney disease) stage 2, GFR 60-89 ml/min 07/28/2022   HLD (hyperlipidemia)    Hypertension    Past Surgical History:  Procedure Laterality Date   APPENDECTOMY     CHOLECYSTECTOMY     HERNIA REPAIR     PROSTATE SURGERY     prostate cancer 2014   Patient Active Problem List   Diagnosis Date Noted   Diabetes mellitus type 2 with neurological manifestations (HCC) 07/29/2022   Hypertension associated with diabetes (HCC) 07/29/2022   Hyperlipidemia associated with type 2 diabetes mellitus (HCC) 07/29/2022   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    ONSET DATE: 03/26/2023  REFERRING DIAG:  Diagnosis  I63.81 (ICD-10-CM) - Left thalamic infarction (HCC)  I69.398,R26.9 (ICD-10-CM) - Abnormality of gait as late effect of stroke    THERAPY DIAG:  Muscle weakness (generalized)  Unsteadiness on feet  Other abnormalities of gait and mobility  Rationale for Evaluation and Treatment: Rehabilitation  SUBJECTIVE:                                                                                                                                                                                             SUBJECTIVE STATEMENT: Patient arrives to session with  [facility]. Patient reports that the new medication makes him feel almost numb. States, "I am doing terrible." Patient reports that both arms and legs are having challenges. Patient denies nighttime tremors. Patient reports that he has not called his doctor about his medication.   Pt accompanied by: self  PERTINENT HISTORY: PMH: hypertension, hyperlipidemia, chronic kidney disease, prostate cancer  PAIN:  Are you having pain? No  No pain currently but does get pain in his R arm at times.  PRECAUTIONS: Fall  RED FLAGS:  None   WEIGHT BEARING RESTRICTIONS: No  FALLS: Has patient fallen in last 6 months? Yes. 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  PLOF: Independent with gait, Independent with transfers, and Requires assistive device for independence  PATIENT GOALS: "hopefully get more feeling back into my leg and my arm"  OBJECTIVE:   DIAGNOSTIC FINDINGS: None relevant to this POC (no new imaging since CVA in Jan 2024)  COGNITION: Overall cognitive status: Within functional limits for tasks assessed   SENSATION: Pt reports ongoing N/T in R hemibody Light touch appears intact  POSTURE: rounded shoulders, forward head, and posterior pelvic tilt  LOWER EXTREMITY ROM:     Active  Right Eval Left Eval  Hip flexion    Hip extension    Hip abduction    Hip adduction    Hip internal rotation    Hip external rotation    Knee flexion    Knee extension Tight HS   Ankle dorsiflexion    Ankle plantarflexion    Ankle inversion    Ankle eversion     (Blank rows = not tested)  LOWER EXTREMITY MMT:    MMT Right Eval Left Eval  Hip flexion 3 4+  Hip extension    Hip abduction    Hip adduction    Hip internal rotation    Hip external  rotation    Knee flexion 2 4+  Knee extension 3 4+  Ankle dorsiflexion 5 5  Ankle plantarflexion    Ankle inversion    Ankle eversion    (Blank rows = not tested)  VITALS   Vitals:   04/18/23 1452  BP: 126/78  Pulse: 69    TRANSFERS: Assistive device utilized: None  Sit to stand: Modified independence Stand to sit: Modified independence Chair to chair: Modified independence   GAIT: Gait pattern: decreased arm swing- Right, decreased step length- Right, decreased hip/knee flexion- Right, decreased ankle dorsiflexion- Right, shuffling, decreased trunk rotation, trunk flexed, and poor foot clearance- Right Distance walked: various clinic distances Assistive device utilized: Quad cane small base and None Level of assistance: Modified independence Comments: No catching of R foot noted this date. Pt ambulated throughout session w/o AD mod I and carried cane out of clinic despite reporting his RLE was "wobbly"   TODAY'S TREATMENT:   Ther Act  Assessed vitals (see above) and WNL for therapy Discussed again extensively with patient concern with presentation from when this therapist last saw patient; notable changes in cognition and difficulty sequencing and motor planning    Gait: Walking into session patient significantly reduced stride, little to no arm swing, reduced foot clearance and catching toes; required close contact to PT but [facility] clearly unsafe for level of instability noted in today's session  Trialed rollator as patient has this at home; utilized 1 x 115' with SBA; improved balance notably and patient demonstrated appropriate use of breaks without prompting; recommend using at this time due extent of balance deficits noted  NMR: Trialed forward step with contralateral arm toss through available ROM on opposite arm  Patient maximal difficulty sequencing with two scarves, could not recall sequencing even when broken into part tasks and tried single arm at time,  frequently swithced back to stepping with same side as  toss without recollection of instructions before 1 x 6 tosses single side, 2 x 6 tosses trying alternating with minimal success even with mirroring Large amplitude stepping to dots on ground for visual targets (forward and lateral) therapist calling out various patterns 3 x 10 (CGA)    PATIENT EDUCATION: Education details: Continue HEP + AD safety Person educated: Patient Education method: Explanation, Demonstration, and Handouts Education comprehension: verbalized understanding, returned demonstration, and needs further education  HOME EXERCISE PROGRAM: From Previous POC: Access Code: [email or web address] URL: [email or web address] Date: 04/10/2023 Prepared by: Alethia Berthold Plaster  Exercises - Feet together with eyes closed and head turns   - 1 x daily - 7 x weekly - 3-4 reps - 20-30 second hold - Tandem Stance in Corner  - 1 x daily - 7 x weekly - 3-4 reps - 15-30 second  hold - Sit to Stand Without Arm Support  - 1 x daily - 7 x weekly - 3 sets - 10 reps  GOALS: Goals reviewed with patient? Yes  SHORT TERM GOALS: Target date: 04/24/2023   Pt will be independent with initial HEP for improved strength, balance, transfers and gait. Baseline: Goal status: INITIAL  2.  Pt will improve 5 x STS to less than or equal to 23 seconds to demonstrate improved functional strength and transfer efficiency.  Baseline: 28.19 sec no UE (9/25) Goal status: INITIAL  3.  Pt will improve gait velocity to at least 1.5 ft/sec for improved gait efficiency and performance at SBA level  Baseline: 1.28 ft/sec with [facility] and SBA (9/25) Goal status: INITIAL  4.  Pt will ambulate greater than or equal to 750 feet on with LRAD and SBA for improved cardiovascular endurance and BLE strength.  Baseline: 581 ft with [facility] and CGA (9/25) Goal status: INITIAL  5.  Pt will improve FGA to 19/30 for decreased fall risk   Baseline: 15/30 Goal status:  REVISED    LONG TERM GOALS: Target date: 05/23/2023   Pt will be independent with final HEP for improved strength, balance, transfers and gait. Baseline:  Goal status: INITIAL  2.  Pt will improve 5 x STS to less than or equal to 18 seconds to demonstrate improved functional strength and transfer efficiency.  Baseline: 28.19 sec no UE (9/25) Goal status: INITIAL  3.  Pt will improve gait velocity to at least 1.75 ft/sec for improved gait efficiency and performance at SBA level  Baseline: 1.28 ft/sec with [facility] and SBA (9/25) Goal status: INITIAL  4.  Pt will improve normal TUG to less than or equal to 15 seconds for improved functional mobility and decreased fall risk. Baseline: 17.88 sec with [facility] (9/25) Goal status: INITIAL  5.  Pt will ambulate greater than or equal to 1000 feet on with LRAD and SBA for improved cardiovascular endurance and BLE strength.  Baseline: 581 ft with [facility] and CGA (9/25)  6.  Pt will improve FGA to 24/30 for decreased fall risk   Baseline: 15/30 Goal status: REVISED   ASSESSMENT:  CLINICAL IMPRESSION: Emphasis of skilled PT session on safety with AD as patient extremely unsafe ambulating into clinic with [facility]. Patient has rollator at home and more appropriate with use of this device during session; recommend use of rollator at this time for safety. Patient continues to demonstrate significant limitations with cognition in today's session with difficulty sequencing movements and only demonstrates ability to complete basic tasks with external visual targets. Patient would benefit from further workup to explain change.  Continue POC.   OBJECTIVE IMPAIRMENTS: Abnormal gait, cardiopulmonary status limiting activity, decreased activity tolerance, decreased balance, decreased endurance, decreased knowledge of use of DME, difficulty walking, decreased ROM, decreased strength, impaired perceived functional ability, increased muscle spasms, impaired UE  functional use, and pain.   ACTIVITY LIMITATIONS: carrying, lifting, bending, standing, squatting, stairs, and transfers  PARTICIPATION LIMITATIONS: driving and community activity  PERSONAL FACTORS: Age, Time since onset of injury/illness/exacerbation, and 3+ comorbidities:    hypertension, hyperlipidemia, chronic kidney disease, prostate cancerare also affecting patient's functional outcome.   REHAB POTENTIAL: Good  CLINICAL DECISION MAKING: Stable/uncomplicated  EVALUATION COMPLEXITY: Low  PLAN:  PT FREQUENCY: 2x/week  PT DURATION: 6 weeks  PLANNED INTERVENTIONS: Therapeutic exercises, Therapeutic activity, Neuromuscular re-education, Balance training, Gait training, Patient/Family education, Self Care, Joint mobilization, Stair training, Vestibular training, Canalith repositioning, Visual/preceptual remediation/compensation, Orthotic/Fit training, DME instructions, Aquatic Therapy, Dry Needling, Electrical stimulation, Cryotherapy, Moist heat, Taping, Manual therapy, and Re-evaluation  PLAN FOR NEXT SESSION: review prior HEP or create new HEP to work on RLE NMR, strengthening, endurance. Postural control, stepping reaction for balance strategies     Maryruth Eve, PT, DPT  [facility] [address] [place], [name]  [address] Phone:  [phone] Fax:  [phone]  04/18/2023, 3:38 PM

## 2023-04-23 ENCOUNTER — Ambulatory Visit: Payer: Medicare Other | Admitting: Physical Therapy

## 2023-04-23 VITALS — BP 141/79 | HR 77

## 2023-04-23 DIAGNOSIS — R2689 Other abnormalities of gait and mobility: Secondary | ICD-10-CM

## 2023-04-23 DIAGNOSIS — R2681 Unsteadiness on feet: Secondary | ICD-10-CM

## 2023-04-23 DIAGNOSIS — M6281 Muscle weakness (generalized): Secondary | ICD-10-CM | POA: Diagnosis not present

## 2023-04-23 DIAGNOSIS — I69351 Hemiplegia and hemiparesis following cerebral infarction affecting right dominant side: Secondary | ICD-10-CM

## 2023-04-23 NOTE — Therapy (Signed)
OUTPATIENT PHYSICAL THERAPY NEURO TREATMENT   Patient Name: Jerome Cardenas MRN: [id number] DOB:[birth year], 72 y.o., male Today's Date: 04/23/2023   PCP: Kerin Salen, PA-C REFERRING PROVIDER: Butch Penny, NP  END OF SESSION:  PT End of Session - 04/23/23 0946     Visit Number 6    Number of Visits 13    Date for PT Re-Evaluation 05/29/23    Authorization Type Medicare    PT Start Time 0947    PT Stop Time 1030    PT Time Calculation (min) 43 min    Equipment Utilized During Treatment Gait belt    Activity Tolerance Patient tolerated treatment well    Behavior During Therapy WFL for tasks assessed/performed                Past Medical History:  Diagnosis Date   Cancer (HCC)    prostate   CKD (chronic kidney disease) stage 2, GFR 60-89 ml/min 07/28/2022   HLD (hyperlipidemia)    Hypertension    Past Surgical History:  Procedure Laterality Date   APPENDECTOMY     CHOLECYSTECTOMY     HERNIA REPAIR     PROSTATE SURGERY     prostate cancer 2014   Patient Active Problem List   Diagnosis Date Noted   Diabetes mellitus type 2 with neurological manifestations (HCC) 07/29/2022   Hypertension associated with diabetes (HCC) 07/29/2022   Hyperlipidemia associated with type 2 diabetes mellitus (HCC) 07/29/2022   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    ONSET DATE: 03/26/2023  REFERRING DIAG:  Diagnosis  I63.81 (ICD-10-CM) - Left thalamic infarction (HCC)  I69.398,R26.9 (ICD-10-CM) - Abnormality of gait as late effect of stroke    THERAPY DIAG:  Muscle weakness (generalized)  Unsteadiness on feet  Other abnormalities of gait and mobility  Hemiplegia and hemiparesis following cerebral infarction affecting right dominant side (HCC)  Rationale for Evaluation and Treatment: Rehabilitation  SUBJECTIVE:                                                                                                                                                                                              SUBJECTIVE STATEMENT:  Patient arrives to session with rollator. Reports no falls, changes in medications, or acute illnesses. Has been using rollator out in community in [facility] in home.   HEP: "I try to do them every day", still feels like the current exercises are a good challenge  Pt accompanied by: self  PERTINENT HISTORY: PMH: hypertension, hyperlipidemia, chronic kidney disease, prostate cancer  PAIN:  Are you having pain? No  No pain currently but does get pain in  his R arm at times.  PRECAUTIONS: Fall  RED FLAGS: None   WEIGHT BEARING RESTRICTIONS: No  FALLS: Has patient fallen in last 6 months? Yes. 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  PLOF: Independent with gait, Independent with transfers, and Requires assistive device for independence  PATIENT GOALS: "hopefully get more feeling back into my leg and my arm"  OBJECTIVE:   DIAGNOSTIC FINDINGS: None relevant to this POC (no new imaging since CVA in Jan 2024)  COGNITION: Overall cognitive status: Within functional limits for tasks assessed   SENSATION: Pt reports ongoing N/T in R hemibody Light touch appears intact  POSTURE: rounded shoulders, forward head, and posterior pelvic tilt  LOWER EXTREMITY ROM:     Active  Right Eval Left Eval  Hip flexion    Hip extension    Hip abduction    Hip adduction    Hip internal rotation    Hip external rotation    Knee flexion    Knee extension Tight HS   Ankle dorsiflexion    Ankle plantarflexion    Ankle inversion    Ankle eversion     (Blank rows = not tested)  LOWER EXTREMITY MMT:    MMT Right Eval Left Eval  Hip flexion 3 4+  Hip extension    Hip abduction    Hip  adduction    Hip internal rotation    Hip external rotation    Knee flexion 2 4+  Knee extension 3 4+  Ankle dorsiflexion 5 5  Ankle plantarflexion    Ankle inversion    Ankle eversion    (Blank rows = not tested)  VITALS   Vitals:   04/23/23 0951  BP: (!) 141/79  Pulse: 77     TRANSFERS: Assistive device utilized: None  Sit to stand: Modified independence Stand to sit: Modified independence Chair to chair: Modified independence   GAIT: Gait pattern: decreased arm swing- Right, decreased step length- Right, decreased hip/knee flexion- Right, decreased ankle dorsiflexion- Right, shuffling, decreased trunk rotation, trunk flexed, and poor foot clearance- Right Distance walked: various clinic distances Assistive device utilized: Quad cane small base and None Level of assistance: Modified independence Comments: No catching of R foot noted this date. Pt ambulated throughout session w/o AD mod I and carried cane out of clinic despite reporting his RLE was "wobbly"    TODAY'S TREATMENT:  TherAct for vitals assessment and STG assessment Vitals:   04/23/23 0951  BP: (!) 141/79  Pulse: 77      [facility] PT Assessment - 04/23/23 0956       Ambulation/Gait   Gait velocity 32.8 ft over 18.17 sec= 1.81 ft/sec   rollator     6 minute walk test results    Aerobic Endurance Distance Walked 755   10/10 RPE at finish     Standardized Balance Assessment   Standardized Balance Assessment Five Times Sit to Stand    Five times sit to stand comments  14.75 sec   no UE use     Functional Gait  Assessment   Gait assessed  Yes    Gait Level Surface Walks 20 ft, slow speed, abnormal gait pattern, evidence for imbalance or deviates 10-15 in outside of the 12 in walkway  width. Requires more than 7 sec to ambulate 20 ft.    Change in Gait Speed Able to change speed, demonstrates mild gait deviations, deviates 6-10 in outside of the 12 in walkway width, or no gait deviations, unable to achieve  a major change in velocity, or uses a change in velocity, or uses an assistive device.    Gait with Horizontal Head Turns Performs head turns smoothly with slight change in gait velocity (eg, minor disruption to smooth gait path), deviates 6-10 in outside 12 in walkway width, or uses an assistive device.    Gait with Vertical Head Turns Performs task with slight change in gait velocity (eg, minor disruption to smooth gait path), deviates 6 - 10 in outside 12 in walkway width or uses assistive device    Gait and Pivot Turn Pivot turns safely in greater than 3 sec and stops with no loss of balance, or pivot turns safely within 3 sec and stops with mild imbalance, requires small steps to catch balance.   3.25 seconds   Step Over Obstacle Is able to step over one shoe box (4.5 in total height) without changing gait speed. No evidence of imbalance.    Gait with Narrow Base of Support Ambulates less than 4 steps heel to toe or cannot perform without assistance.    Gait with Eyes Closed Cannot walk 20 ft without assistance, severe gait deviations or imbalance, deviates greater than 15 in outside 12 in walkway width or will not attempt task.   deviates greater than 15 inches to right   Ambulating Backwards Walks 20 ft, uses assistive device, slower speed, mild gait deviations, deviates 6-10 in outside 12 in walkway width.    Steps Alternating feet, must use rail.    Total Score 15    FGA comment: high fall risk              PATIENT EDUCATION:  Education details: Continue HEP, continue monitoring BP, outcome measures results, safety for mobility during upcoming vacation Person educated: Patient Education method: Explanation and Demonstration Education comprehension: verbalized understanding, returned demonstration, and needs further education  HOME EXERCISE PROGRAM: From Previous POC: Access Code: [email or web address] URL: [email or web address] Date: 04/10/2023 Prepared by: Alethia Berthold  Plaster  Exercises - Feet together with eyes closed and head turns   - 1 x daily - 7 x weekly - 3-4 reps - 20-30 second hold - Tandem Stance in Corner  - 1 x daily - 7 x weekly - 3-4 reps - 15-30 second  hold - Sit to Stand Without Arm Support  - 1 x daily - 7 x weekly - 3 sets - 10 reps  GOALS: Goals reviewed with patient? Yes  SHORT TERM GOALS: Target date: 04/24/2023   Pt will be independent with initial HEP for improved strength, balance, transfers and gait. Baseline: no HEP Goal status: MET  2.  Pt will improve 5 x STS to less than or equal to 23 seconds to demonstrate improved functional strength and transfer efficiency.  Baseline: 28.19 sec no UE (9/25), 14.75 sec no UE (10/15) Goal status: MET  3.  Pt will improve gait velocity to at least 1.5 ft/sec for improved gait efficiency and performance at SBA level  Baseline: 1.28 ft/sec with [facility] and SBA (9/25), 1.81 ft/sec w/ rollator and SBA (10/15) Goal status: MET  4.  Pt will ambulate greater than or equal to 750 feet on with LRAD and SBA for improved cardiovascular endurance and BLE strength.  Baseline: 581  ft with [facility] and CGA (9/25), 755 feet w/ rollator and SBA (10/15) Goal status: MET  5.  Pt will improve FGA to 19/30 for decreased fall risk   Baseline: 15/30, 15/30 (10/15) Goal status: NOT MET    LONG TERM GOALS: Target date: 05/23/2023   Pt will be independent with final HEP for improved strength, balance, transfers and gait. Baseline:  Goal status: INITIAL  2.  Pt will improve 5 x STS to less than or equal to 12 seconds to demonstrate improved functional strength and transfer efficiency.  Baseline: 28.19 sec no UE (9/25), 14.75 sec no UE (10/15) Goal status: REVISED  3.  Pt will improve gait velocity to at least 2.00 ft/sec for improved gait efficiency and performance at SBA level  Baseline: 1.28 ft/sec with [facility] and SBA (9/25), 1.81 ft/sec w/ rollator and SBA (10/15) Goal status: REVISED  4.  Pt  will improve normal TUG to less than or equal to 15 seconds for improved functional mobility and decreased fall risk. Baseline: 17.88 sec with [facility] (9/25) Goal status: INITIAL  5.  Pt will ambulate greater than or equal to 1000 feet on with LRAD and SBA for improved cardiovascular endurance and BLE strength.  Baseline: 581 ft with [facility] and CGA (9/25), 755 feet w/ rollator and SBA (10/15)  6.  Pt will improve FGA to 24/30 for decreased fall risk   Baseline: 15/30 Goal status: REVISED   ASSESSMENT:  CLINICAL IMPRESSION: Emphasis of skilled PT session on assessment of short term goals. Pt has met 4/5 short term goals and not met 1/5 short term goals assessed this date. Pt has greatly improved 5x STS time but is still categorized as a fall risk (>12 seconds). Pt demonstrates improvements in ambulation speed and distance completed during the today with rollator compared to evaluation with [facility]. Pt's score remains the same on the FGA, demonstrating no decline in dynamic balance but no notable improvements. Pt has demonstrated decreasing fall risk and increasing strength, mobility, and endurance since evaluation. Pt continues to have deficits in dynamic balance including backwards ambulation, ambulation with eyes closed, stairs navigation, and gait speed. Continue POC.   OBJECTIVE IMPAIRMENTS: Abnormal gait, cardiopulmonary status limiting activity, decreased activity tolerance, decreased balance, decreased endurance, decreased knowledge of use of DME, difficulty walking, decreased ROM, decreased strength, impaired perceived functional ability, increased muscle spasms, impaired UE functional use, and pain.   ACTIVITY LIMITATIONS: carrying, lifting, bending, standing, squatting, stairs, and transfers  PARTICIPATION LIMITATIONS: driving and community activity  PERSONAL FACTORS: Age, Time since onset of injury/illness/exacerbation, and 3+ comorbidities:    hypertension, hyperlipidemia, chronic  kidney disease, prostate cancerare also affecting patient's functional outcome.   REHAB POTENTIAL: Good  CLINICAL DECISION MAKING: Stable/uncomplicated  EVALUATION COMPLEXITY: Low  PLAN:  PT FREQUENCY: 2x/week  PT DURATION: 6 weeks  PLANNED INTERVENTIONS: Therapeutic exercises, Therapeutic activity, Neuromuscular re-education, Balance training, Gait training, Patient/Family education, Self Care, Joint mobilization, Stair training, Vestibular training, Canalith repositioning, Visual/preceptual remediation/compensation, Orthotic/Fit training, DME instructions, Aquatic Therapy, Dry Needling, Electrical stimulation, Cryotherapy, Moist heat, Taping, Manual therapy, and Re-evaluation  PLAN FOR NEXT SESSION: review prior HEP or create new HEP to work on RLE NMR, strengthening, endurance. Postural control, stepping reaction for balance strategies, activities for increasing gait speed and endurance- treadmill training?, narrow BOS, eyes closed, backwards ambulation     Beverely Low, [facility] [address] [place], [name]  [address] Phone:  [phone] Fax:  [phone]  04/23/2023, 10:54  AM

## 2023-04-25 ENCOUNTER — Ambulatory Visit: Payer: Medicare Other | Admitting: Physical Therapy

## 2023-04-25 VITALS — BP 137/69 | HR 79

## 2023-04-25 DIAGNOSIS — R2689 Other abnormalities of gait and mobility: Secondary | ICD-10-CM

## 2023-04-25 DIAGNOSIS — M6281 Muscle weakness (generalized): Secondary | ICD-10-CM | POA: Diagnosis not present

## 2023-04-25 DIAGNOSIS — R2681 Unsteadiness on feet: Secondary | ICD-10-CM

## 2023-04-25 NOTE — Therapy (Signed)
OUTPATIENT PHYSICAL THERAPY NEURO TREATMENT   Patient Name: Jerome Cardenas MRN: [id number] DOB:[birth year], 72 y.o., male Today's Date: 04/25/2023   PCP: Kerin Salen, PA-C REFERRING PROVIDER: Butch Penny, NP  END OF SESSION:  PT End of Session - 04/25/23 1022     Visit Number 7    Number of Visits 13    Date for PT Re-Evaluation 05/29/23    Authorization Type Medicare    PT Start Time 1020    PT Stop Time 1058    PT Time Calculation (min) 38 min    Equipment Utilized During Treatment --    Activity Tolerance Patient tolerated treatment well    Behavior During Therapy WFL for tasks assessed/performed                Past Medical History:  Diagnosis Date   Cancer (HCC)    prostate   CKD (chronic kidney disease) stage 2, GFR 60-89 ml/min 07/28/2022   HLD (hyperlipidemia)    Hypertension    Past Surgical History:  Procedure Laterality Date   APPENDECTOMY     CHOLECYSTECTOMY     HERNIA REPAIR     PROSTATE SURGERY     prostate cancer 2014   Patient Active Problem List   Diagnosis Date Noted   Diabetes mellitus type 2 with neurological manifestations (HCC) 07/29/2022   Hypertension associated with diabetes (HCC) 07/29/2022   Hyperlipidemia associated with type 2 diabetes mellitus (HCC) 07/29/2022   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    ONSET DATE: 03/26/2023  REFERRING DIAG:  Diagnosis  I63.81 (ICD-10-CM) - Left thalamic infarction (HCC)  I69.398,R26.9 (ICD-10-CM) - Abnormality of gait as late effect of stroke    THERAPY DIAG:  Muscle weakness (generalized)  Unsteadiness on feet  Other abnormalities of gait and mobility  Rationale for Evaluation and Treatment: Rehabilitation  SUBJECTIVE:                                                                                                                                                                                             SUBJECTIVE STATEMENT:  Patient arrives to session with  rollator. Denies falls or acute changes. Showing pt picture of peddle bike, has not used it yet. States HEP is still challenging   Pt accompanied by: self  PERTINENT HISTORY: PMH: hypertension, hyperlipidemia, chronic kidney disease, prostate cancer  PAIN:  Are you having pain? No  No pain currently but does get pain in his R arm at times.  PRECAUTIONS: Fall  RED FLAGS: None   WEIGHT BEARING RESTRICTIONS: No  FALLS: Has patient fallen in last 6 months? Yes. Number of falls  1 (a few days ago in parking a lot downtown)  LIVING ENVIRONMENT: Lives with: lives with their family (wife) Lives in: House/apartment Stairs: Yes: Internal: 12 steps; on right going up Has following equipment at home: Retail banker - 2 wheeled  PLOF: Independent with gait, Independent with transfers, and Requires assistive device for independence  PATIENT GOALS: "hopefully get more feeling back into my leg and my arm"  OBJECTIVE:   DIAGNOSTIC FINDINGS: None relevant to this POC (no new imaging since CVA in Jan 2024)  COGNITION: Overall cognitive status: Within functional limits for tasks assessed   SENSATION: Pt reports ongoing N/T in R hemibody Light touch appears intact  POSTURE: rounded shoulders, forward head, and posterior pelvic tilt  LOWER EXTREMITY ROM:     Active  Right Eval Left Eval  Hip flexion    Hip extension    Hip abduction    Hip adduction    Hip internal rotation    Hip external rotation    Knee flexion    Knee extension Tight HS   Ankle dorsiflexion    Ankle plantarflexion    Ankle inversion    Ankle eversion     (Blank rows = not tested)  LOWER EXTREMITY MMT:    MMT Right Eval Left Eval  Hip flexion 3 4+  Hip extension    Hip abduction    Hip adduction    Hip internal rotation    Hip external rotation    Knee flexion 2 4+  Knee extension 3 4+  Ankle dorsiflexion 5 5  Ankle plantarflexion    Ankle inversion    Ankle eversion     (Blank rows = not tested)  VITALS  Vitals:   04/25/23 1026  BP: 137/69  Pulse: 79      TRANSFERS: Assistive device utilized: None  Sit to stand: Modified independence Stand to sit: Modified independence Chair to chair: Modified independence   GAIT: Gait pattern: decreased arm swing- Right, decreased step length- Right, decreased hip/knee flexion- Right, decreased ankle dorsiflexion- Right, shuffling, decreased trunk rotation, trunk flexed, and poor foot clearance- Right Distance walked: various clinic distances Assistive device utilized: Quad cane small base and None Level of assistance: Modified independence Comments: No catching of R foot noted this date. Pt ambulated throughout session w/o AD mod I and carried cane out of clinic despite reporting his RLE was "wobbly"    TODAY'S TREATMENT:  Ther Act  Assessed BP (see above) and WNL for therapy  Entirety of session spent discussing recent changes noted by therapists (difficulty w/motor planning, impaired cognition, flat affect, worsening balance) and importance of pt obtaining a thorough neurology work-up to rule out new CVA, PD, etc. Pt in agreement to obtain more thorough work-up but will pursue once he returns from [place] vacation. Pt reports he tried the Trazadone and he slept for 14 hours, so he is no longer taking it. Pt reports he did feel better rested and his leg did not bother him w/the medication, but he does not want to sleep for 14 hours. Informed pt that he must advocate for himself and inform the doctor is a medication does not work. Encouraged pt to reach out to Dr. Ottis Stain and inform him of the effects of the medication so that it can be modified for the pt. Pt verbalized understanding.    PATIENT EDUCATION:  Education details: See above Person educated: Patient Education method: Medical illustrator Education comprehension: verbalized understanding, returned demonstration, and  needs further  education  HOME EXERCISE PROGRAM: From Previous POC: Access Code: [email or web address] URL: [email or web address] Date: 04/10/2023 Prepared by: Alethia Berthold Jannice Beitzel  Exercises - Feet together with eyes closed and head turns   - 1 x daily - 7 x weekly - 3-4 reps - 20-30 second hold - Tandem Stance in Corner  - 1 x daily - 7 x weekly - 3-4 reps - 15-30 second  hold - Sit to Stand Without Arm Support  - 1 x daily - 7 x weekly - 3 sets - 10 reps  GOALS: Goals reviewed with patient? Yes  SHORT TERM GOALS: Target date: 04/24/2023   Pt will be independent with initial HEP for improved strength, balance, transfers and gait. Baseline: no HEP Goal status: MET  2.  Pt will improve 5 x STS to less than or equal to 23 seconds to demonstrate improved functional strength and transfer efficiency.  Baseline: 28.19 sec no UE (9/25), 14.75 sec no UE (10/15) Goal status: MET  3.  Pt will improve gait velocity to at least 1.5 ft/sec for improved gait efficiency and performance at SBA level  Baseline: 1.28 ft/sec with [facility] and SBA (9/25), 1.81 ft/sec w/ rollator and SBA (10/15) Goal status: MET  4.  Pt will ambulate greater than or equal to 750 feet on with LRAD and SBA for improved cardiovascular endurance and BLE strength.  Baseline: 581 ft with [facility] and CGA (9/25), 755 feet w/ rollator and SBA (10/15) Goal status: MET  5.  Pt will improve FGA to 19/30 for decreased fall risk   Baseline: 15/30, 15/30 (10/15) Goal status: NOT MET    LONG TERM GOALS: Target date: 05/23/2023   Pt will be independent with final HEP for improved strength, balance, transfers and gait. Baseline:  Goal status: INITIAL  2.  Pt will improve 5 x STS to less than or equal to 12 seconds to demonstrate improved functional strength and transfer efficiency.  Baseline: 28.19 sec no UE (9/25), 14.75 sec no UE (10/15) Goal status: REVISED  3.  Pt will improve gait velocity to at least 2.00 ft/sec for improved gait  efficiency and performance at SBA level  Baseline: 1.28 ft/sec with [facility] and SBA (9/25), 1.81 ft/sec w/ rollator and SBA (10/15) Goal status: REVISED  4.  Pt will improve normal TUG to less than or equal to 15 seconds for improved functional mobility and decreased fall risk. Baseline: 17.88 sec with [facility] (9/25) Goal status: INITIAL  5.  Pt will ambulate greater than or equal to 1000 feet on with LRAD and SBA for improved cardiovascular endurance and BLE strength.  Baseline: 581 ft with [facility] and CGA (9/25), 755 feet w/ rollator and SBA (10/15)  6.  Pt will improve FGA to 24/30 for decreased fall risk   Baseline: 15/30 Goal status: REVISED   ASSESSMENT:  CLINICAL IMPRESSION: Emphasis of skilled PT session on pt education. Pt reports he is still not sleeping, tried the Trazadone and it made him sleep for 14 hours so he stopped taking it. Encouraged pt to reach out to MD and inform him of this, as MD cannot help if pt does not communicate. Pt in agreement to obtain more thorough neurology work-up due to significant decline in cognition, balance and balance in 4 months. Will reassess once pt returns from [place] vacation. Continue POC.   OBJECTIVE IMPAIRMENTS: Abnormal gait, cardiopulmonary status limiting activity, decreased activity tolerance, decreased balance, decreased endurance, decreased knowledge of use of DME, difficulty walking,  decreased ROM, decreased strength, impaired perceived functional ability, increased muscle spasms, impaired UE functional use, and pain.   ACTIVITY LIMITATIONS: carrying, lifting, bending, standing, squatting, stairs, and transfers  PARTICIPATION LIMITATIONS: driving and community activity  PERSONAL FACTORS: Age, Time since onset of injury/illness/exacerbation, and 3+ comorbidities:    hypertension, hyperlipidemia, chronic kidney disease, prostate cancerare also affecting patient's functional outcome.   REHAB POTENTIAL: Good  CLINICAL DECISION MAKING:  Stable/uncomplicated  EVALUATION COMPLEXITY: Low  PLAN:  PT FREQUENCY: 2x/week  PT DURATION: 6 weeks  PLANNED INTERVENTIONS: Therapeutic exercises, Therapeutic activity, Neuromuscular re-education, Balance training, Gait training, Patient/Family education, Self Care, Joint mobilization, Stair training, Vestibular training, Canalith repositioning, Visual/preceptual remediation/compensation, Orthotic/Fit training, DME instructions, Aquatic Therapy, Dry Needling, Electrical stimulation, Cryotherapy, Moist heat, Taping, Manual therapy, and Re-evaluation  PLAN FOR NEXT SESSION: review prior HEP or create new HEP to work on RLE NMR, strengthening, endurance. Postural control, stepping reaction for balance strategies, activities for increasing gait speed and endurance- treadmill training?, narrow BOS, eyes closed, backwards ambulation     Jill Alexanders Zethan Alfieri, PT, DPT [facility] [address] [place], [name]  [address] Phone:  [phone] Fax:  [phone]   04/25/2023, 10:59 AM

## 2023-04-30 ENCOUNTER — Encounter: Payer: Medicare Other | Admitting: Occupational Therapy

## 2023-05-06 NOTE — Therapy (Signed)
OUTPATIENT OCCUPATIONAL THERAPY NEURO EVALUATION  Patient Name: Jerome Cardenas MRN: [id number] DOB:31-Oct-1950, 72 y.o., male Today's Date: 05/07/2023  PCP: Kerin Salen, PA-C REFERRING PROVIDER: Butch Penny, NP  END OF SESSION:  OT End of Session - 05/07/23 1253     Visit Number 1    Number of Visits 8    Date for OT Re-Evaluation 06/23/23    Authorization Type MCR, [employer]    Progress Note Due on Visit 10    OT Start Time 1105    OT Stop Time 1145    OT Time Calculation (min) 40 min    Activity Tolerance Patient tolerated treatment well    Behavior During Therapy WFL for tasks assessed/performed             Past Medical History:  Diagnosis Date   Cancer (HCC)    prostate   CKD (chronic kidney disease) stage 2, GFR 60-89 ml/min 07/28/2022   HLD (hyperlipidemia)    Hypertension    Past Surgical History:  Procedure Laterality Date   APPENDECTOMY     CHOLECYSTECTOMY     HERNIA REPAIR     PROSTATE SURGERY     prostate cancer 2014   Patient Active Problem List   Diagnosis Date Noted   Diabetes mellitus type 2 with neurological manifestations (HCC) 07/29/2022   Hypertension associated with diabetes (HCC) 07/29/2022   Hyperlipidemia associated with type 2 diabetes mellitus (HCC) 07/29/2022   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    ONSET DATE: 04/08/2023 (referral date)   REFERRING DIAG: M62.81 (ICD-10-CM) - Muscle weakness of right upper extremity  Note:  Right upper extremity weakness hand weakness  THERAPY DIAG:  Muscle weakness (generalized)  Other lack of coordination  Unsteadiness on feet  Other disturbances of skin sensation  Hemiplegia and hemiparesis following cerebral infarction affecting right dominant side (HCC)  Rationale for Evaluation and Treatment: Rehabilitation  SUBJECTIVE:   SUBJECTIVE STATEMENT: I want to work on my Rt side Pt accompanied by: self  PERTINENT HISTORY: 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 PMH: Lt thalamic stroke in Jan 2024, hypertension, hyperlipidemia, chronic kidney disease, prostate cancer   PRECAUTIONS: Fall, active CA  WEIGHT BEARING RESTRICTIONS: No  PAIN:  Are you having pain? No, but inconsistent reports at last admission re: pain RUE  FALLS: Has patient fallen in last 6 months? Yes. Number of falls 1  LIVING ENVIRONMENT: Lives with: lives with their spouse Lives in:2 STORY HOUSE, bedroom on 2nd floor, 14 steps total to bedroom (7 to platform, 7 more to 2nd floor) Has following equipment at home: Quad cane small base, shower chair, and rollator  PLOF: Independent and retired  PATIENT GOALS: get my Rt hand better  OBJECTIVE:  Note: Objective measures were completed at Evaluation unless otherwise noted.  HAND DOMINANCE: Right  ADLs: Eating: independent  Grooming: independent UB Dressing: independent  LB Dressing: independent Toileting: independent Bathing: independent Tub Shower transfers: independent  Equipment: Grab bars  IADLs: Shopping: wife performs Light housekeeping: wife performs Meal Prep: wife performs Community mobility: pt driving Medication management: independent per pt report Landscape architect: wife performs Handwriting: 90% legible in print for name and short sentence, pt reports he has always written smaller but at last admission he said his handwriting had gotten smaller  MOBILITY STATUS:  use rollator in community, uses quad cane in the house     UPPER EXTREMITY ROM:  LUE AROM WNL's but still required cues for full sh flexion. RUE slightly  limited shoulder flexion and elbow extension against gravity from stroke. Elbow distally WFL's   UPPER EXTREMITY MMT:   Rt shoulder 3/5, Lt shoulder 4/5    HAND FUNCTION: Grip strength: Right: 34.6 lbs (declined from 77 lbs at last admission); Left: 74.2 lbs  COORDINATION: 9 Hole Peg test: Right: 30 sec; Left: 24 sec  SENSATION: Light touch:  WFL Pt reports RUE feels cold in the morning compared to LUE  EDEMA: none   COGNITION: Overall cognitive status:  pt inconsistent with reports of function/ability, pain and poor health advocate for himself  VISION: Subjective report: vision is ok Baseline vision: Bifocals and Wears glasses all the time Visual history:  N/A   PERCEPTION: Not tested  PRAXIS: Not tested  OBSERVATIONS: Pt with significant decline in grip strength Rt hand since last admission. Pt is the same with coordination Rt hand. Pt with fluctuating balance.    TODAY'S TREATMENT:                                                                                                                              DATE: n/a today   PATIENT EDUCATION: Education details: OT POC - Explained this would be a shorter duration since he had extensive occupational therapy already this year and only significant change is Rt grip strength Person educated: Patient Education method: Explanation Education comprehension: verbalized understanding  HOME EXERCISE PROGRAM: N/A today (eval only) - however will more than likely re-issue HEP's from previous admission   GOALS: Goals reviewed with patient? Yes   LONG TERM GOALS: Target date: 06/23/23 (extended date d/t scheduling conflicts)   Independent with HEP for RUE shoulder strength and grip strength Baseline:  Goal status: INITIAL  2.  Rt grip strength to improve by 10 lbs or greater Baseline: 34 lbs Goal status: INITIAL  3.  Pt to be issued strategies for writing bigger Baseline:  Goal status: INITIAL     ASSESSMENT:  CLINICAL IMPRESSION: Patient is a 72 y.o. male familiar to this clinic (last seen April/May 2024) who was seen today for occupational therapy evaluation for RUE muscle weakness. Pt had Lt thalamic stroke January 2024. Pt also with active CA. Pt fluctuates in his history and his current pain level and abilities. However, pt is performing similarly to last  O.T. admission with decline in Rt grip strength and decreased balance which is not medically explained by stroke in Jan 2024. Pt will benefit from short duration of O.T. to improve RUE strength and review/update HEP's prn.   PERFORMANCE DEFICITS: in functional skills including IADLs, coordination, ROM, strength, pain, Fine motor control, mobility, balance, and UE functional use, cognitive skills including memory and safety awareness.   IMPAIRMENTS: are limiting patient from IADLs, leisure, and social participation.   CO-MORBIDITIES: has co-morbidities such as cancer  that affects occupational performance. Patient will benefit from skilled OT to address above impairments and improve overall function.  MODIFICATION OR ASSISTANCE TO COMPLETE EVALUATION: No  modification of tasks or assist necessary to complete an evaluation.  OT OCCUPATIONAL PROFILE AND HISTORY: Problem focused assessment: Including review of records relating to presenting problem.  CLINICAL DECISION MAKING: Moderate - several treatment options, min-mod task modification necessary  REHAB POTENTIAL: Good  EVALUATION COMPLEXITY: Low    PLAN:  OT FREQUENCY: 2x/week  OT DURATION: 4 weeks  PLANNED INTERVENTIONS: 97535 self care/ADL training, 09811 therapeutic exercise, 97530 therapeutic activity, 97112 neuromuscular re-education, 97140 manual therapy, passive range of motion, functional mobility training, energy conservation, patient/family education, and DME and/or AE instructions  RECOMMENDED OTHER SERVICES: none at this time  CONSULTED AND AGREED WITH PLAN OF CARE: Patient  PLAN FOR NEXT SESSION: look at previously issued HEP's from earlier admission and review and/or update prn   Sheran Lawless, OT 05/07/2023, 12:54 PM

## 2023-05-07 ENCOUNTER — Ambulatory Visit: Payer: Medicare Other | Admitting: Physical Therapy

## 2023-05-07 ENCOUNTER — Ambulatory Visit: Payer: Medicare Other | Admitting: Occupational Therapy

## 2023-05-07 VITALS — BP 135/68 | HR 75

## 2023-05-07 DIAGNOSIS — M6281 Muscle weakness (generalized): Secondary | ICD-10-CM | POA: Diagnosis not present

## 2023-05-07 DIAGNOSIS — R2689 Other abnormalities of gait and mobility: Secondary | ICD-10-CM

## 2023-05-07 DIAGNOSIS — R208 Other disturbances of skin sensation: Secondary | ICD-10-CM

## 2023-05-07 DIAGNOSIS — R2681 Unsteadiness on feet: Secondary | ICD-10-CM

## 2023-05-07 DIAGNOSIS — I69351 Hemiplegia and hemiparesis following cerebral infarction affecting right dominant side: Secondary | ICD-10-CM

## 2023-05-07 DIAGNOSIS — R278 Other lack of coordination: Secondary | ICD-10-CM

## 2023-05-07 NOTE — Therapy (Cosign Needed)
OUTPATIENT PHYSICAL THERAPY NEURO TREATMENT- RECERTIFICATION   Patient Name: Jerome Cardenas MRN: [id number] DOB:1951/04/15, 72 y.o., male Today's Date: 05/07/2023   PCP: Kerin Salen, PA-C REFERRING PROVIDER: Butch Penny, NP  END OF SESSION:  PT End of Session - 05/07/23 1019     Visit Number 8    Number of Visits 13    Date for PT Re-Evaluation 05/29/23    Authorization Type Medicare    PT Start Time 1018    PT Stop Time 1100    PT Time Calculation (min) 42 min    Equipment Utilized During Treatment Gait belt    Activity Tolerance Patient tolerated treatment well    Behavior During Therapy WFL for tasks assessed/performed                 Past Medical History:  Diagnosis Date   Cancer (HCC)    prostate   CKD (chronic kidney disease) stage 2, GFR 60-89 ml/min 07/28/2022   HLD (hyperlipidemia)    Hypertension    Past Surgical History:  Procedure Laterality Date   APPENDECTOMY     CHOLECYSTECTOMY     HERNIA REPAIR     PROSTATE SURGERY     prostate cancer 2014   Patient Active Problem List   Diagnosis Date Noted   Diabetes mellitus type 2 with neurological manifestations (HCC) 07/29/2022   Hypertension associated with diabetes (HCC) 07/29/2022   Hyperlipidemia associated with type 2 diabetes mellitus (HCC) 07/29/2022   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    ONSET DATE: 03/26/2023  REFERRING DIAG:  Diagnosis  I63.81 (ICD-10-CM) - Left thalamic infarction (HCC)  I69.398,R26.9 (ICD-10-CM) - Abnormality of gait as late effect of stroke    THERAPY DIAG:  Muscle weakness (generalized)  Unsteadiness on feet  Other abnormalities of gait and mobility  Hemiplegia and hemiparesis following cerebral infarction affecting right dominant side (HCC)  Rationale for Evaluation and Treatment: Rehabilitation  SUBJECTIVE:                                                                                                                                                                                              SUBJECTIVE STATEMENT:  Pt reports no falls during vacation, returned on Saturday. He has not called MD about sleep medications or to move up appointment (currently in May). Pt reports feeling no better and no worse physically. Pt obtained mattress topper, feels his bed is more comfortable and has had better sleep since.  Pt accompanied by: self  PERTINENT HISTORY: PMH: hypertension, hyperlipidemia, chronic kidney disease, prostate cancer  PAIN:  Are you having pain? No  No pain  currently but does get pain in his R arm at times.  PRECAUTIONS: Fall  RED FLAGS: None   WEIGHT BEARING RESTRICTIONS: No  FALLS: Has patient fallen in last 6 months? Yes. 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  PLOF: Independent with gait, Independent with transfers, and Requires assistive device for independence  PATIENT GOALS: "hopefully get more feeling back into my leg and my arm"  OBJECTIVE:   DIAGNOSTIC FINDINGS: None relevant to this POC (no new imaging since CVA in Jan 2024)  COGNITION: Overall cognitive status: Within functional limits for tasks assessed   SENSATION: Pt reports ongoing N/T in R hemibody Light touch appears intact  POSTURE: rounded shoulders, forward head, and posterior pelvic tilt  LOWER EXTREMITY ROM:     Active  Right Eval Left Eval  Hip flexion    Hip extension    Hip abduction    Hip adduction    Hip internal rotation    Hip external rotation    Knee flexion    Knee extension Tight HS   Ankle dorsiflexion    Ankle plantarflexion    Ankle inversion    Ankle eversion     (Blank rows = not tested)  LOWER EXTREMITY MMT:    MMT Right Eval Left Eval  Hip  flexion 3 4+  Hip extension    Hip abduction    Hip adduction    Hip internal rotation    Hip external rotation    Knee flexion 2 4+  Knee extension 3 4+  Ankle dorsiflexion 5 5  Ankle plantarflexion    Ankle inversion    Ankle eversion    (Blank rows = not tested)  VITALS  Vitals:   05/07/23 1022  BP: 135/68  Pulse: 75       TRANSFERS: Assistive device utilized: None  Sit to stand: Modified independence Stand to sit: Modified independence Chair to chair: Modified independence   GAIT: Gait pattern: decreased arm swing- Right, decreased step length- Right, decreased hip/knee flexion- Right, decreased ankle dorsiflexion- Right, shuffling, decreased trunk rotation, trunk flexed, and poor foot clearance- Right Distance walked: various clinic distances Assistive device utilized: Quad cane small base and None Level of assistance: Modified independence Comments: No catching of R foot noted this date. Pt ambulated throughout session w/o AD mod I and carried cane out of clinic despite reporting his RLE was "wobbly"    TODAY'S TREATMENT:  Ther Act  Assessed vitals, WNL for therapy Discussed follow up appointment w/ neurologist or PCP to address sleep medication and recent changes in balance, speech, and memory. Instructed pt to call soon to see if appointment can be moved up from May.  Vitals:   05/07/23 1022  BP: 135/68  Pulse: 75   TherEx STS w/o UE support x10 repetitions from standard chair pt rates as medium difficulty Squat w/ chair behind for tactile cue for depth pt rates as hard pt not fully standing after each squat added to HEP at 6-8 reps    In corner w/ chair in front for safety Feet together head turns x30 seconds pt initially turns whole body, verbal and visual cues to only turn head Pt rates as easy Feet together eyes closed head turns Pt rates as medium Tandem  stance x30 sec hold Pt rates as Medium reports RLE in front is harder than LLE  Pt  is independent w/ increased time and effort for sit <>supine and L<>R rolling bed mobility  Supine w/ 2 pillows under head Posterior pelvic tilts, 2 holds x 10 seconds Verbal and visual cues to "tuck tail like a dog", flatten back into table + glute bridges x6 repetitions + supine marches x10 repetitions bilaterally  Sidelying Clamshells Bilaterally 2 sets x 10 repetitions Verbal and tactile cues to use heel of hand to palpate glute med contraction and keep hip forwards   PATIENT EDUCATION:  Education details: HEP, talk to provider about sleep medication and earlier screening Person educated: Patient Education method: Explanation, Demonstration, Tactile cues, Verbal cues, and Handouts Education comprehension: verbalized understanding, returned demonstration, verbal cues required, tactile cues required, and needs further education  HOME EXERCISE PROGRAM: From Previous POC: Access Code: [email or web address] URL: [email or web address] Date: 04/10/2023 Prepared by: Alethia Berthold Plaster  Exercises - Feet together with eyes closed and head turns   - 1 x daily - 7 x weekly - 3-4 reps - 20-30 second hold - Tandem Stance in Corner  - 1 x daily - 7 x weekly - 3-4 reps - 30 second  hold - Squat with Chair Touch  - 1 x daily - 7 x weekly - 3 sets - 6-8 reps - Supine Posterior Pelvic Tilt  - 1 x daily - 7 x weekly - 1 sets - 3 reps - 10 second hold - Supine Bridge  - 1 x daily - 7 x weekly - 1 sets - 8 reps - 5 seconds hold - Supine March  - 1 x daily - 7 x weekly - 2 sets - 20 reps - 1-2 second hold - Clamshell  - 1 x daily - 7 x weekly - 3 sets - 10 reps  GOALS: Goals reviewed with patient? Yes  SHORT TERM GOALS: Target date: 04/24/2023   Pt will be independent with initial HEP for improved strength, balance, transfers and gait. Baseline: no HEP Goal status: MET  2.  Pt will improve 5 x STS to less than or equal to 23 seconds to demonstrate improved functional strength and transfer  efficiency.  Baseline: 28.19 sec no UE (9/25), 14.75 sec no UE (10/15) Goal status: MET  3.  Pt will improve gait velocity to at least 1.5 ft/sec for improved gait efficiency and performance at SBA level  Baseline: 1.28 ft/sec with [facility] and SBA (9/25), 1.81 ft/sec w/ rollator and SBA (10/15) Goal status: MET  4.  Pt will ambulate greater than or equal to 750 feet on with LRAD and SBA for improved cardiovascular endurance and BLE strength.  Baseline: 581 ft with [facility] and CGA (9/25), 755 feet w/ rollator and SBA (10/15) Goal status: MET  5.  Pt will improve FGA to 19/30 for decreased fall risk   Baseline: 15/30, 15/30 (10/15) Goal status: NOT MET    LONG TERM GOALS: Target date: 05/23/2023   Pt will be independent with final HEP for improved strength, balance, transfers and gait. Baseline:  Goal status: INITIAL  2.  Pt will improve 5 x STS to less than or equal to 12 seconds to demonstrate improved functional strength and transfer efficiency.  Baseline: 28.19 sec no UE (9/25), 14.75 sec no UE (10/15) Goal status: REVISED  3.  Pt will improve gait velocity to at least 2.00 ft/sec for improved gait efficiency and performance at SBA level  Baseline:  1.28 ft/sec with SBQC and SBA (9/25), 1.81 ft/sec w/ rollator and SBA (10/15) Goal status: REVISED  4.  Pt will improve normal TUG to less than or equal to 15 seconds for improved functional mobility and decreased fall risk. Baseline: 17.88 sec with SBQC (9/25) Goal status: INITIAL  5.  Pt will ambulate greater than or equal to 1000 feet on with LRAD and SBA for improved cardiovascular endurance and BLE strength.  Baseline: 581 ft with [facility] and CGA (9/25), 755 feet w/ rollator and SBA (10/15)  6.  Pt will improve FGA to 24/30 for decreased fall risk   Baseline: 15/30 Goal status: REVISED   ASSESSMENT:  CLINICAL IMPRESSION:  Emphasis of skilled PT session on continued patient education and further development of a  global strengthening HEP. Pt agreeable to contacting provider about his sleep medication and moving up his appointment date to be re-evaluated for new changes in balance and memory. Pt tolerated strengthening exercises well today, added to HEP. Continue POC.   OBJECTIVE IMPAIRMENTS: Abnormal gait, cardiopulmonary status limiting activity, decreased activity tolerance, decreased balance, decreased endurance, decreased knowledge of use of DME, difficulty walking, decreased ROM, decreased strength, impaired perceived functional ability, increased muscle spasms, impaired UE functional use, and pain.   ACTIVITY LIMITATIONS: carrying, lifting, bending, standing, squatting, stairs, and transfers  PARTICIPATION LIMITATIONS: driving and community activity  PERSONAL FACTORS: Age, Time since onset of injury/illness/exacerbation, and 3+ comorbidities:    hypertension, hyperlipidemia, chronic kidney disease, prostate cancerare also affecting patient's functional outcome.   REHAB POTENTIAL: Good  CLINICAL DECISION MAKING: Stable/uncomplicated  EVALUATION COMPLEXITY: Low  PLAN:  PT FREQUENCY: 2x/week  PT DURATION: 6 weeks  PLANNED INTERVENTIONS: Therapeutic exercises, Therapeutic activity, Neuromuscular re-education, Balance training, Gait training, Patient/Family education, Self Care, Joint mobilization, Stair training, Vestibular training, Canalith repositioning, Visual/preceptual remediation/compensation, Orthotic/Fit training, DME instructions, Aquatic Therapy, Dry Needling, Electrical stimulation, Cryotherapy, Moist heat, Taping, Manual therapy, and Re-evaluation  PLAN FOR NEXT SESSION: add to HEP as appropriate to work on RLE NMR, strengthening, endurance. Postural control, stepping reaction for balance strategies, activities for increasing gait speed and endurance- treadmill training?, narrow BOS, eyes closed, backwards ambulation     Beverely Low, [facility] [address] [place], [name]  [address] Phone:  [phone] Fax:  [phone]   05/07/2023, 12:39 PM

## 2023-05-09 ENCOUNTER — Ambulatory Visit: Payer: Medicare Other | Admitting: Physical Therapy

## 2023-05-09 ENCOUNTER — Ambulatory Visit: Payer: Medicare Other | Admitting: Occupational Therapy

## 2023-05-09 VITALS — BP 132/57 | HR 66

## 2023-05-09 DIAGNOSIS — R278 Other lack of coordination: Secondary | ICD-10-CM

## 2023-05-09 DIAGNOSIS — R208 Other disturbances of skin sensation: Secondary | ICD-10-CM

## 2023-05-09 DIAGNOSIS — I69351 Hemiplegia and hemiparesis following cerebral infarction affecting right dominant side: Secondary | ICD-10-CM

## 2023-05-09 DIAGNOSIS — R2681 Unsteadiness on feet: Secondary | ICD-10-CM

## 2023-05-09 DIAGNOSIS — M6281 Muscle weakness (generalized): Secondary | ICD-10-CM

## 2023-05-09 DIAGNOSIS — R2689 Other abnormalities of gait and mobility: Secondary | ICD-10-CM

## 2023-05-09 NOTE — Therapy (Signed)
OUTPATIENT PHYSICAL THERAPY NEURO TREATMENT   Patient Name: Jerome Cardenas MRN: [id number] DOB:1951-03-02, 72 y.o., male Today's Date: 05/09/2023   PCP: Kerin Salen, PA-C REFERRING PROVIDER: Butch Penny, NP  END OF SESSION:  PT End of Session - 05/09/23 1016     Visit Number 9    Number of Visits 13    Date for PT Re-Evaluation 05/29/23    Authorization Type Medicare    PT Start Time 1020    PT Stop Time 1058    PT Time Calculation (min) 38 min    Equipment Utilized During Treatment Gait belt    Activity Tolerance Patient tolerated treatment well;Patient limited by fatigue    Behavior During Therapy WFL for tasks assessed/performed                  Past Medical History:  Diagnosis Date   Cancer (HCC)    prostate   CKD (chronic kidney disease) stage 2, GFR 60-89 ml/min 07/28/2022   HLD (hyperlipidemia)    Hypertension    Past Surgical History:  Procedure Laterality Date   APPENDECTOMY     CHOLECYSTECTOMY     HERNIA REPAIR     PROSTATE SURGERY     prostate cancer 2014   Patient Active Problem List   Diagnosis Date Noted   Diabetes mellitus type 2 with neurological manifestations (HCC) 07/29/2022   Hypertension associated with diabetes (HCC) 07/29/2022   Hyperlipidemia associated with type 2 diabetes mellitus (HCC) 07/29/2022   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    ONSET DATE: 03/26/2023  REFERRING DIAG:  Diagnosis  I63.81 (ICD-10-CM) - Left thalamic infarction (HCC)  I69.398,R26.9 (ICD-10-CM) - Abnormality of gait as late effect of stroke    THERAPY DIAG:  Muscle weakness (generalized)  Unsteadiness on feet  Other lack of coordination  Other disturbances of skin sensation  Hemiplegia and hemiparesis following cerebral infarction affecting right dominant side (HCC)  Other abnormalities of gait and mobility  Rationale for Evaluation and Treatment: Rehabilitation  SUBJECTIVE:                                                                                                                                                                                              SUBJECTIVE STATEMENT:  Pt denies falls or acute changes. Pt reports feeling "Terrible, like I have a fever." Has had chills and sweats "all the time", unusual tiredness, muscle aches, appetite has been "so-so". Pt did eat before appointment. Pt reports that he does feel like this often.  Has not tried additions to HEP yet. Has not contacted doctors office about sleep medication or moving  up appointment. R arm and leg have been numb, worse than normal last night. He has a cream that he puts on but that did not help. Somewhat numb right now, especially in the bottom of both of his feet.   Pt accompanied by: self  PERTINENT HISTORY: PMH: hypertension, hyperlipidemia, chronic kidney disease, prostate cancer  PAIN:  Are you having pain? No  No pain currently but does get pain in his R arm at times.  PRECAUTIONS: Fall  RED FLAGS: None   WEIGHT BEARING RESTRICTIONS: No  FALLS: Has patient fallen in last 6 months? Yes. 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  PLOF: Independent with gait, Independent with transfers, and Requires assistive device for independence  PATIENT GOALS: "hopefully get more feeling back into my leg and my arm"  OBJECTIVE:   DIAGNOSTIC FINDINGS: None relevant to this POC (no new imaging since CVA in Jan 2024)  COGNITION: Overall cognitive status: Within functional limits for tasks assessed   SENSATION: Pt reports ongoing N/T in R hemibody Light touch appears intact  POSTURE: rounded shoulders, forward head, and posterior pelvic tilt  LOWER EXTREMITY ROM:     Active  Right Eval  Left Eval  Hip flexion    Hip extension    Hip abduction    Hip adduction    Hip internal rotation    Hip external rotation    Knee flexion    Knee extension Tight HS   Ankle dorsiflexion    Ankle plantarflexion    Ankle inversion    Ankle eversion     (Blank rows = not tested)  LOWER EXTREMITY MMT:    MMT Right Eval Left Eval  Hip flexion 3 4+  Hip extension    Hip abduction    Hip adduction    Hip internal rotation    Hip external rotation    Knee flexion 2 4+  Knee extension 3 4+  Ankle dorsiflexion 5 5  Ankle plantarflexion    Ankle inversion    Ankle eversion    (Blank rows = not tested)  TRANSFERS: Assistive device utilized: None  Sit to stand: Modified independence Stand to sit: Modified independence Chair to chair: Modified independence  GAIT: Gait pattern: decreased arm swing- Right, decreased step length- Right, decreased hip/knee flexion- Right, decreased ankle dorsiflexion- Right, shuffling, decreased trunk rotation, trunk flexed, and poor foot clearance- Right Distance walked: various clinic distances Assistive device utilized: Quad cane small base and None Level of assistance: Modified independence Comments: No catching of R foot noted this date. Pt ambulated throughout session w/o AD mod I and carried cane out of clinic despite reporting his RLE was "wobbly"    TODAY'S TREATMENT:  Ther Act  Assessed vitals, WNL for therapy Vitals:   05/09/23 1028  BP: (!) 132/57  Pulse: 66  Temp: 98.3* F  W/ blue band resistance at hips and CGA: Resisted gait w/ rollator x115 ft Resisted gait w/ no AD x115 ft Resisted gait w/ no AD and perturbations x115 ft Resisted gait w/ no AD and perturbations at faster speed x115 ft Pt required seated rest break at completion of this activity, rates fatigue 5/10  TherEx STS w/ LLE elevated on 4" step, 2 sets x 10 repetitions Pt  reports "feeling it" in L knee, fatigues around 8th repetition   PATIENT EDUCATION:   Education details: continue HEP, talk to provider about sleep medication and earlier screening Person educated: Patient Education method: Explanation Education comprehension: verbalized understanding and needs further education  HOME EXERCISE PROGRAM: From Previous POC: Access Code: [email or web address] URL: [email or web address] Date: 04/10/2023 Prepared by: Alethia Berthold Plaster  Exercises - Feet together with eyes closed and head turns   - 1 x daily - 7 x weekly - 3-4 reps - 20-30 second hold - Tandem Stance in Corner  - 1 x daily - 7 x weekly - 3-4 reps - 30 second  hold - Squat with Chair Touch  - 1 x daily - 7 x weekly - 3 sets - 6-8 reps - Supine Posterior Pelvic Tilt  - 1 x daily - 7 x weekly - 1 sets - 3 reps - 10 second hold - Supine Bridge  - 1 x daily - 7 x weekly - 1 sets - 8 reps - 5 seconds hold - Supine March  - 1 x daily - 7 x weekly - 2 sets - 20 reps - 1-2 second hold - Clamshell  - 1 x daily - 7 x weekly - 3 sets - 10 reps  GOALS: Goals reviewed with patient? Yes  SHORT TERM GOALS: Target date: 04/24/2023   Pt will be independent with initial HEP for improved strength, balance, transfers and gait. Baseline: no HEP Goal status: MET  2.  Pt will improve 5 x STS to less than or equal to 23 seconds to demonstrate improved functional strength and transfer efficiency.  Baseline: 28.19 sec no UE (9/25), 14.75 sec no UE (10/15) Goal status: MET  3.  Pt will improve gait velocity to at least 1.5 ft/sec for improved gait efficiency and performance at SBA level  Baseline: 1.28 ft/sec with [facility] and SBA (9/25), 1.81 ft/sec w/ rollator and SBA (10/15) Goal status: MET  4.  Pt will ambulate greater than or equal to 750 feet on with LRAD and SBA for improved cardiovascular endurance and BLE strength.  Baseline: 581 ft with [facility] and CGA (9/25), 755 feet w/ rollator and SBA (10/15) Goal status: MET  5.  Pt will improve FGA to 19/30 for decreased fall risk   Baseline:  15/30, 15/30 (10/15) Goal status: NOT MET    LONG TERM GOALS: Target date: 05/23/2023   Pt will be independent with final HEP for improved strength, balance, transfers and gait. Baseline:  Goal status: INITIAL  2.  Pt will improve 5 x STS to less than or equal to 12 seconds to demonstrate improved functional strength and transfer efficiency.  Baseline: 28.19 sec no UE (9/25), 14.75 sec no UE (10/15) Goal status: REVISED  3.  Pt will improve gait velocity to at least 2.00 ft/sec for improved gait efficiency and performance at SBA level  Baseline: 1.28 ft/sec with [facility] and SBA (9/25), 1.81 ft/sec w/ rollator and SBA (10/15) Goal status: REVISED  4.  Pt will improve normal TUG to less than or equal to 15 seconds for improved functional mobility and decreased fall risk. Baseline: 17.88 sec with SBQC (9/25) Goal status: INITIAL  5.  Pt will ambulate greater than or equal to 1000 feet on with LRAD and SBA for improved cardiovascular endurance and BLE strength.  Baseline: 581 ft with [facility] and CGA (9/25), 755 feet w/ rollator and SBA (10/15)  6.  Pt will improve FGA to 24/30 for decreased fall  risk   Baseline: 15/30 Goal status: REVISED   ASSESSMENT:  CLINICAL IMPRESSION:  Emphasis of skilled PT session on resisted gait, gait with perturbations, and RLE strengthening. No LOB w/ perturbations w/o AD and CGA, even when instructed to walk faster. Pt not feeling his best this date, requiring additional seated rest breaks and recovery time. Continue POC.   OBJECTIVE IMPAIRMENTS: Abnormal gait, cardiopulmonary status limiting activity, decreased activity tolerance, decreased balance, decreased endurance, decreased knowledge of use of DME, difficulty walking, decreased ROM, decreased strength, impaired perceived functional ability, increased muscle spasms, impaired UE functional use, and pain.   ACTIVITY LIMITATIONS: carrying, lifting, bending, standing, squatting, stairs, and  transfers  PARTICIPATION LIMITATIONS: driving and community activity  PERSONAL FACTORS: Age, Time since onset of injury/illness/exacerbation, and 3+ comorbidities:    hypertension, hyperlipidemia, chronic kidney disease, prostate cancerare also affecting patient's functional outcome.   REHAB POTENTIAL: Good  CLINICAL DECISION MAKING: Stable/uncomplicated  EVALUATION COMPLEXITY: Low  PLAN:  PT FREQUENCY: 2x/week  PT DURATION: 6 weeks  PLANNED INTERVENTIONS: Therapeutic exercises, Therapeutic activity, Neuromuscular re-education, Balance training, Gait training, Patient/Family education, Self Care, Joint mobilization, Stair training, Vestibular training, Canalith repositioning, Visual/preceptual remediation/compensation, Orthotic/Fit training, DME instructions, Aquatic Therapy, Dry Needling, Electrical stimulation, Cryotherapy, Moist heat, Taping, Manual therapy, and Re-evaluation  PLAN FOR NEXT SESSION: add to HEP as appropriate to work on RLE NMR, strengthening, endurance. Postural control, stepping reaction for balance strategies, activities for increasing gait speed and endurance- treadmill training?, narrow BOS, eyes closed, backwards ambulation, 10th visit progress note next session     Beverely Low, [name] [address] [place], [name]  [address] Phone:  [phone] Fax:  [phone]   05/09/2023, 11:47 AM

## 2023-05-09 NOTE — Patient Instructions (Addendum)
Suggestions for Handwriting Changes Many people with Parkinson's notice changes in their handwriting.  Handwriting often becomes small and cramped, and can become more difficult to control when writing for longer periods of time.  This handwriting change is called micrographia.  Why does micrographia occur?  Parkinson's can cause slowing of movement, and feelings of muscle stiffness in the hands and fingers.  Loss of automatic motion also affects the easy, flowing motion of handwriting.  This can impact even simple writing tasks such as signing your name or writing a shopping list.  Attempts to write quickly without thinking about forming each letter contributes to small, cramped handwriting, and may cause the hand to develop a feeling of tightness.  How can I make writing easier?  Make a deliberate effort to form each letter.  This can be hard to do at first, but is very effective in improving size and legibility of handwriting.  Use a pen grip (round or triangular shaped rubber or foam cylinders available at stationary stores or where writing materials are found) or a larger size pen to keep your hand more relaxed.  Try printing rather than writing in a cursive style. Printing causes you to pause briefly between each letter, keeping writing more legible.   Using lined paper may provide a "visual target" to keep all letters big when writing.   A ballpoint pen typically works better than felt tip or "rolling writer"/gel styles.  Rest your hand if it begins to feel "tight".  Pause briefly when you see your handwriting becoming smaller.  Avoid hurrying or trying to write long passages if you are feeling stressed or fatigued.  Practice helps!  Remind yourself to slow down, aim big, and pause often!  Perform "flicks"/PWR! Hands if your hand feels tight, your writing gets smaller, before you start writing, or if tremors increase.  Involving your team: An occupational therapist can provide  assessment and individual recommendations for improvement of your handwriting.  This handout was adapted from [email or web address]

## 2023-05-14 ENCOUNTER — Ambulatory Visit: Payer: Medicare Other | Attending: Internal Medicine | Admitting: Physical Therapy

## 2023-05-14 ENCOUNTER — Ambulatory Visit: Payer: Medicare Other | Admitting: Occupational Therapy

## 2023-05-14 VITALS — BP 145/68 | HR 62

## 2023-05-14 DIAGNOSIS — M6281 Muscle weakness (generalized): Secondary | ICD-10-CM | POA: Diagnosis present

## 2023-05-14 DIAGNOSIS — R208 Other disturbances of skin sensation: Secondary | ICD-10-CM | POA: Insufficient documentation

## 2023-05-14 DIAGNOSIS — R2681 Unsteadiness on feet: Secondary | ICD-10-CM | POA: Insufficient documentation

## 2023-05-14 DIAGNOSIS — R2689 Other abnormalities of gait and mobility: Secondary | ICD-10-CM | POA: Insufficient documentation

## 2023-05-14 DIAGNOSIS — I69351 Hemiplegia and hemiparesis following cerebral infarction affecting right dominant side: Secondary | ICD-10-CM | POA: Diagnosis present

## 2023-05-14 DIAGNOSIS — R278 Other lack of coordination: Secondary | ICD-10-CM | POA: Insufficient documentation

## 2023-05-14 NOTE — Therapy (Signed)
OUTPATIENT PHYSICAL THERAPY NEURO TREATMENT- 10TH VISIT PROGRESS NOTE   Patient Name: Jerome Cardenas MRN: [id number] DOB:[birth year], 72 y.o., male Today's Date: 05/14/2023   PCP: Kerin Salen, PA-C REFERRING PROVIDER: Butch Penny, NP  Physical Therapy Progress Note   Dates of Reporting Period:04/03/23 - 05/14/23  See Note below for Objective Data and Assessment of Progress/Goals.    END OF SESSION:  PT End of Session - 05/14/23 1107     Visit Number 10    Number of Visits 13    Date for PT Re-Evaluation 05/29/23    Authorization Type Medicare    PT Start Time 1104    PT Stop Time 1144    PT Time Calculation (min) 40 min    Equipment Utilized During Treatment Gait belt    Activity Tolerance Patient tolerated treatment well    Behavior During Therapy WFL for tasks assessed/performed                   Past Medical History:  Diagnosis Date   Cancer (HCC)    prostate   CKD (chronic kidney disease) stage 2, GFR 60-89 ml/min 07/28/2022   HLD (hyperlipidemia)    Hypertension    Past Surgical History:  Procedure Laterality Date   APPENDECTOMY     CHOLECYSTECTOMY     HERNIA REPAIR     PROSTATE SURGERY     prostate cancer 2014   Patient Active Problem List   Diagnosis Date Noted   Diabetes mellitus type 2 with neurological manifestations (HCC) 07/29/2022   Hypertension associated with diabetes (HCC) 07/29/2022   Hyperlipidemia associated with type 2 diabetes mellitus (HCC) 07/29/2022   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    ONSET DATE: 03/26/2023  REFERRING DIAG:  Diagnosis  I63.81 (ICD-10-CM) - Left thalamic infarction (HCC)  I69.398,R26.9 (ICD-10-CM) - Abnormality of gait as late effect of stroke    THERAPY DIAG:  Muscle weakness (generalized)  Other abnormalities of gait and mobility  Unsteadiness on feet  Rationale for Evaluation and Treatment: Rehabilitation  SUBJECTIVE:                                                                                                                                                                                              SUBJECTIVE STATEMENT:  Pt reports doing "okay". Called Dr. Ottis Stain about the sleep medication and was told to just stop taking the medication. Did not mention the Gabapentin. Will call him back. Called Dr. Don Perking office to request an appointment, has not heard back. Inquiring about who can refer him to Dr. Arbutus Leas. Denies falls or fatigue today. Did not know  he had OT this morning.   Pt accompanied by: self  PERTINENT HISTORY: PMH: hypertension, hyperlipidemia, chronic kidney disease, prostate cancer  PAIN:  Are you having pain? No  No pain currently but does get pain in his R arm at times.  PRECAUTIONS: Fall  RED FLAGS: None   WEIGHT BEARING RESTRICTIONS: No  FALLS: Has patient fallen in last 6 months? Yes. 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  PLOF: Independent with gait, Independent with transfers, and Requires assistive device for independence  PATIENT GOALS: "hopefully get more feeling back into my leg and my arm"  OBJECTIVE:   DIAGNOSTIC FINDINGS: None relevant to this POC (no new imaging since CVA in Jan 2024)  COGNITION: Overall cognitive status: Within functional limits for tasks assessed   SENSATION: Pt reports ongoing N/T in R hemibody Light touch appears intact  POSTURE: rounded shoulders, forward head, and posterior pelvic tilt  LOWER EXTREMITY ROM:     Active  Right Eval Left Eval  Hip flexion    Hip extension    Hip abduction    Hip adduction    Hip internal rotation    Hip external rotation    Knee flexion    Knee extension Tight HS   Ankle dorsiflexion    Ankle plantarflexion     Ankle inversion    Ankle eversion     (Blank rows = not tested)  LOWER EXTREMITY MMT:    MMT Right Eval Left Eval  Hip flexion 3 4+  Hip extension    Hip abduction    Hip adduction    Hip internal rotation    Hip external rotation    Knee flexion 2 4+  Knee extension 3 4+  Ankle dorsiflexion 5 5  Ankle plantarflexion    Ankle inversion    Ankle eversion    (Blank rows = not tested)  TRANSFERS: Assistive device utilized: None  Sit to stand: Modified independence Stand to sit: Modified independence Chair to chair: Modified independence  GAIT: Gait pattern: decreased arm swing- Right, decreased step length- Right, decreased hip/knee flexion- Right, decreased ankle dorsiflexion- Right, shuffling, decreased trunk rotation, trunk flexed, and poor foot clearance- Right Distance walked: various clinic distances Assistive device utilized: Walker - 4 wheeled and None Level of assistance: Modified independence and SBA Comments: Pt ambulated into session using rollator w/shuffled gait pattern but no no LOB noted. Pt ambulated short distances throughout clinic during session w/no AD w/SBA. Noted increased step clearance/length without AD.   VITALS  Vitals:   05/14/23 1130 05/14/23 1136  BP: (!) 195/70 (!) 145/68  Pulse: 63 62     TODAY'S TREATMENT:  Ther Act  Assessed vitals (see above) and systolic BP elevated but WNL for therapy Informed pt to contact PCP to request referral to Dr. Arbutus Leas, pt verbalized understanding.  Reviewed pt's schedule to ensure he has all of his appointments written correctly in his calendar so he does not miss any more therapy sessions.   Ther Ex  SciFit multi-peaks level 11 for 8 minutes using BUE/BLEs for neural priming for reciprocal movement, dynamic cardiovascular warmup and increased amplitude of stepping. RPE of "whew"/10 following activity. Reassessed BP (see above) and systolic BP elevated. After 5 minutes of seated rest, BP  did reduce to  normal limits.  Sit to stands w/BUE abduction at top of rep w/blue wedge under feet to bias posterior lean, x10 reps, for improved BLE strength, postural control and anterior weight shift. Progressed to adding 5# DB OH presses at top of rep and noted increased difficulty w/postural control as pt leaning backward, requiring min A to stabilize. When asked about fatigue level, pt did not provide numeric rating and stated he was "tired".     PATIENT EDUCATION:  Education details: continue HEP, talk to provider about gabapentin, contacting Michelle Nasuti about neuro referral, next appointment time  Person educated: Patient Education method: Explanation Education comprehension: verbalized understanding and needs further education  HOME EXERCISE PROGRAM: From Previous POC: Access Code: [email or web address] URL: [email or web address] Date: 04/10/2023 Prepared by: Alethia Berthold Jacorie Ernsberger  Exercises - Feet together with eyes closed and head turns   - 1 x daily - 7 x weekly - 3-4 reps - 20-30 second hold - Tandem Stance in Corner  - 1 x daily - 7 x weekly - 3-4 reps - 30 second  hold - Squat with Chair Touch  - 1 x daily - 7 x weekly - 3 sets - 6-8 reps - Supine Posterior Pelvic Tilt  - 1 x daily - 7 x weekly - 1 sets - 3 reps - 10 second hold - Supine Bridge  - 1 x daily - 7 x weekly - 1 sets - 8 reps - 5 seconds hold - Supine March  - 1 x daily - 7 x weekly - 2 sets - 20 reps - 1-2 second hold - Clamshell  - 1 x daily - 7 x weekly - 3 sets - 10 reps  GOALS: Goals reviewed with patient? Yes  SHORT TERM GOALS: Target date: 04/24/2023   Pt will be independent with initial HEP for improved strength, balance, transfers and gait. Baseline: no HEP Goal status: MET  2.  Pt will improve 5 x STS to less than or equal to 23 seconds to demonstrate improved functional strength and transfer efficiency.  Baseline: 28.19 sec no UE (9/25), 14.75 sec no UE (10/15) Goal status: MET  3.  Pt will improve gait  velocity to at least 1.5 ft/sec for improved gait efficiency and performance at SBA level  Baseline: 1.28 ft/sec with [facility] and SBA (9/25), 1.81 ft/sec w/ rollator and SBA (10/15) Goal status: MET  4.  Pt will ambulate greater than or equal to 750 feet on with LRAD and SBA for improved cardiovascular endurance and BLE strength.  Baseline: 581 ft with [facility] and CGA (9/25), 755 feet w/ rollator and SBA (10/15) Goal status: MET  5.  Pt will improve FGA to 19/30 for decreased fall risk   Baseline: 15/30, 15/30 (10/15) Goal status: NOT MET    LONG TERM GOALS: Target date: 05/23/2023   Pt will be independent with final HEP for improved strength, balance, transfers and gait. Baseline:  Goal status: INITIAL  2.  Pt will improve 5 x STS to less than or equal to 12 seconds to demonstrate improved functional strength and transfer efficiency.  Baseline: 28.19 sec no UE (9/25), 14.75 sec no UE (10/15) Goal status: REVISED  3.  Pt will improve gait velocity to at least 2.00 ft/sec for improved gait efficiency and performance at SBA level  Baseline: 1.28 ft/sec with [facility] and SBA (9/25), 1.81 ft/sec w/ rollator and SBA (10/15) Goal status: REVISED  4.  Pt will improve normal TUG to less than or  equal to 15 seconds for improved functional mobility and decreased fall risk. Baseline: 17.88 sec with [facility] (9/25) Goal status: INITIAL  5.  Pt will ambulate greater than or equal to 1000 feet on with LRAD and SBA for improved cardiovascular endurance and BLE strength.  Baseline: 581 ft with [facility] and CGA (9/25), 755 feet w/ rollator and SBA (10/15)  6.  Pt will improve FGA to 24/30 for decreased fall risk   Baseline: 15/30 Goal status: REVISED   ASSESSMENT:  CLINICAL IMPRESSION:  Session limited due to pt's elevated BP. Emphasis of skilled PT session on global endurance and strength. Pt reports he did call Dr. Ottis Stain and was told to stop taking the sleep medication if it is too strong. Did  not inquire about Gabapentin, encouraged pt to call back and ask about this as it was mentioned at his most recent visit. Pt working on obtaining referral to Dr. Arbutus Leas as well. No instability noted this date but pt reported significant fatigue at end of session. Continue POC.   OBJECTIVE IMPAIRMENTS: Abnormal gait, cardiopulmonary status limiting activity, decreased activity tolerance, decreased balance, decreased endurance, decreased knowledge of use of DME, difficulty walking, decreased ROM, decreased strength, impaired perceived functional ability, increased muscle spasms, impaired UE functional use, and pain.   ACTIVITY LIMITATIONS: carrying, lifting, bending, standing, squatting, stairs, and transfers  PARTICIPATION LIMITATIONS: driving and community activity  PERSONAL FACTORS: Age, Time since onset of injury/illness/exacerbation, and 3+ comorbidities:    hypertension, hyperlipidemia, chronic kidney disease, prostate cancerare also affecting patient's functional outcome.   REHAB POTENTIAL: Good  CLINICAL DECISION MAKING: Stable/uncomplicated  EVALUATION COMPLEXITY: Low  PLAN:  PT FREQUENCY: 2x/week  PT DURATION: 6 weeks  PLANNED INTERVENTIONS: Therapeutic exercises, Therapeutic activity, Neuromuscular re-education, Balance training, Gait training, Patient/Family education, Self Care, Joint mobilization, Stair training, Vestibular training, Canalith repositioning, Visual/preceptual remediation/compensation, Orthotic/Fit training, DME instructions, Aquatic Therapy, Dry Needling, Electrical stimulation, Cryotherapy, Moist heat, Taping, Manual therapy, and Re-evaluation  PLAN FOR NEXT SESSION: add to HEP as appropriate to work on RLE NMR, strengthening, endurance. Postural control, stepping reaction for balance strategies, activities for increasing gait speed and endurance- treadmill training?, narrow BOS, eyes closed, backwards ambulation, 10th visit progress note next session     Vinal Rosengrant E  Juanjose Mojica, PT, DPT  05/14/2023, 11:54 AM

## 2023-05-16 ENCOUNTER — Ambulatory Visit: Payer: Medicare Other | Admitting: Physical Therapy

## 2023-05-16 ENCOUNTER — Telehealth: Payer: Self-pay | Admitting: *Deleted

## 2023-05-16 VITALS — BP 126/64 | HR 72

## 2023-05-16 DIAGNOSIS — R2689 Other abnormalities of gait and mobility: Secondary | ICD-10-CM

## 2023-05-16 DIAGNOSIS — M6281 Muscle weakness (generalized): Secondary | ICD-10-CM | POA: Diagnosis not present

## 2023-05-16 DIAGNOSIS — R2681 Unsteadiness on feet: Secondary | ICD-10-CM

## 2023-05-16 DIAGNOSIS — R278 Other lack of coordination: Secondary | ICD-10-CM

## 2023-05-16 NOTE — Therapy (Signed)
OUTPATIENT PHYSICAL THERAPY NEURO TREATMENT   Patient Name: Jerome Cardenas MRN: [id number] DOB:Jul 06, 1951, 72 y.o., male Today's Date: 05/16/2023   PCP: Kerin Salen, PA-C REFERRING PROVIDER: Butch Penny, NP    END OF SESSION:  PT End of Session - 05/16/23 1018     Visit Number 11    Number of Visits 13    Date for PT Re-Evaluation 05/29/23    Authorization Type Medicare    PT Start Time 1018    PT Stop Time 1057    PT Time Calculation (min) 39 min    Equipment Utilized During Treatment Gait belt    Activity Tolerance Patient tolerated treatment well    Behavior During Therapy WFL for tasks assessed/performed                    Past Medical History:  Diagnosis Date   Cancer (HCC)    prostate   CKD (chronic kidney disease) stage 2, GFR 60-89 ml/min 07/28/2022   HLD (hyperlipidemia)    Hypertension    Past Surgical History:  Procedure Laterality Date   APPENDECTOMY     CHOLECYSTECTOMY     HERNIA REPAIR     PROSTATE SURGERY     prostate cancer 2014   Patient Active Problem List   Diagnosis Date Noted   Diabetes mellitus type 2 with neurological manifestations (HCC) 07/29/2022   Hypertension associated with diabetes (HCC) 07/29/2022   Hyperlipidemia associated with type 2 diabetes mellitus (HCC) 07/29/2022   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    ONSET DATE: 03/26/2023  REFERRING DIAG:  Diagnosis  I63.81 (ICD-10-CM) - Left thalamic infarction (HCC)  I69.398,R26.9 (ICD-10-CM) - Abnormality of gait as late effect of stroke    THERAPY DIAG:  Muscle weakness (generalized)  Other abnormalities of gait and mobility  Unsteadiness on feet  Other lack of coordination  Rationale for Evaluation and Treatment: Rehabilitation  SUBJECTIVE:                                                                                                                                                                                             SUBJECTIVE  STATEMENT:  Pt reports doing "okay". Denies falls or acute changes. "I had my wife call this morning to see if I had an appointment".  Pt accompanied by: self  PERTINENT HISTORY: PMH: hypertension, hyperlipidemia, chronic kidney disease, prostate cancer  PAIN:  Are you having pain? No  No pain currently but does get pain in his R arm at times.  PRECAUTIONS: Fall  RED FLAGS: None   WEIGHT BEARING RESTRICTIONS: No  FALLS: Has patient fallen in  last 6 months? Yes. 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  PLOF: Independent with gait, Independent with transfers, and Requires assistive device for independence  PATIENT GOALS: "hopefully get more feeling back into my leg and my arm"  OBJECTIVE:   DIAGNOSTIC FINDINGS: None relevant to this POC (no new imaging since CVA in Jan 2024)  COGNITION: Overall cognitive status: Within functional limits for tasks assessed   SENSATION: Pt reports ongoing N/T in R hemibody Light touch appears intact  POSTURE: rounded shoulders, forward head, and posterior pelvic tilt  LOWER EXTREMITY ROM:     Active  Right Eval Left Eval  Hip flexion    Hip extension    Hip abduction    Hip adduction    Hip internal rotation    Hip external rotation    Knee flexion    Knee extension Tight HS   Ankle dorsiflexion    Ankle plantarflexion    Ankle inversion    Ankle eversion     (Blank rows = not tested)  LOWER EXTREMITY MMT:    MMT Right Eval Left Eval  Hip flexion 3 4+  Hip extension    Hip abduction    Hip adduction    Hip internal rotation    Hip external rotation    Knee flexion 2 4+  Knee extension 3 4+  Ankle dorsiflexion 5 5  Ankle plantarflexion    Ankle inversion    Ankle eversion     (Blank rows = not tested)  TRANSFERS: Assistive device utilized: None  Sit to stand: Modified independence Stand to sit: Modified independence Chair to chair: Modified independence  GAIT: Gait pattern: decreased arm swing- Right, decreased step length- Right, decreased hip/knee flexion- Right, decreased ankle dorsiflexion- Right, shuffling, decreased trunk rotation, trunk flexed, and poor foot clearance- Right Distance walked: various clinic distances Assistive device utilized: Walker - 4 wheeled and None Level of assistance: Modified independence and SBA Comments: Pt ambulated into session using rollator w/shuffled gait pattern but no no LOB noted. Pt ambulated short distances throughout clinic during session w/no AD w/SBA. Noted increased step clearance/length without AD.    TODAY'S TREATMENT:  Ther Act  Vitals:   05/16/23 1024  BP: 126/64  Pulse: 72    Reviewed pt's schedule and printed off another handout    Ther Ex  SciFit multi-peaks level 13.0 for 8 minutes using BUE/BLEs for neural priming for reciprocal movement, dynamic cardiovascular warmup and increased amplitude of stepping. RPE of 10/10 following activity. "Whew"  NMR In parallel bars, CGA Step-to pattern forwards and backwards over 4" hurdles x3 laps, BUE support on bars Pt's feet catching on hurdles with backwards steps frequently Lateral stepping over 4" hurdles x3 laps each BLE leading Cued to leave enough space for following foot to meet Hip-width stance on air-ex w/ eyes closed x30 seconds Hip-width stance on air-ex w/ eyes closed x60 seconds w/ perturbations in all directions Romberg stance on air-ex w/ eyes closed x30 seconds Romberg stance on air-ex w/ eyes closed x60 seconds w/ perturbations in all directions Pt needed to use UE x2 to recover when nudged on L side to right, no LOB   PATIENT EDUCATION:  Education details: continue HEP, upcoming visits Person educated: Patient Education method:  Explanation and Handouts Education comprehension: verbalized understanding and needs further education  HOME EXERCISE PROGRAM:  From Previous POC: Access Code: [email or web address] URL: [email or web address] Date: 04/10/2023 Prepared by: Alethia Berthold Plaster  Exercises - Feet together with eyes closed and head turns   - 1 x daily - 7 x weekly - 3-4 reps - 20-30 second hold - Tandem Stance in Corner  - 1 x daily - 7 x weekly - 3-4 reps - 30 second  hold - Squat with Chair Touch  - 1 x daily - 7 x weekly - 3 sets - 6-8 reps - Supine Posterior Pelvic Tilt  - 1 x daily - 7 x weekly - 1 sets - 3 reps - 10 second hold - Supine Bridge  - 1 x daily - 7 x weekly - 1 sets - 8 reps - 5 seconds hold - Supine March  - 1 x daily - 7 x weekly - 2 sets - 20 reps - 1-2 second hold - Clamshell  - 1 x daily - 7 x weekly - 3 sets - 10 reps  GOALS: Goals reviewed with patient? Yes  SHORT TERM GOALS: Target date: 04/24/2023   Pt will be independent with initial HEP for improved strength, balance, transfers and gait. Baseline: no HEP Goal status: MET  2.  Pt will improve 5 x STS to less than or equal to 23 seconds to demonstrate improved functional strength and transfer efficiency.  Baseline: 28.19 sec no UE (9/25), 14.75 sec no UE (10/15) Goal status: MET  3.  Pt will improve gait velocity to at least 1.5 ft/sec for improved gait efficiency and performance at SBA level  Baseline: 1.28 ft/sec with [facility] and SBA (9/25), 1.81 ft/sec w/ rollator and SBA (10/15) Goal status: MET  4.  Pt will ambulate greater than or equal to 750 feet on with LRAD and SBA for improved cardiovascular endurance and BLE strength.  Baseline: 581 ft with [facility] and CGA (9/25), 755 feet w/ rollator and SBA (10/15) Goal status: MET  5.  Pt will improve FGA to 19/30 for decreased fall risk   Baseline: 15/30, 15/30 (10/15) Goal status: NOT MET    LONG TERM GOALS: Target date: 05/23/2023   Pt will be independent with final  HEP for improved strength, balance, transfers and gait. Baseline:  Goal status: INITIAL  2.  Pt will improve 5 x STS to less than or equal to 12 seconds to demonstrate improved functional strength and transfer efficiency.  Baseline: 28.19 sec no UE (9/25), 14.75 sec no UE (10/15) Goal status: REVISED  3.  Pt will improve gait velocity to at least 2.00 ft/sec for improved gait efficiency and performance at SBA level  Baseline: 1.28 ft/sec with [facility] and SBA (9/25), 1.81 ft/sec w/ rollator and SBA (10/15) Goal status: REVISED  4.  Pt will improve normal TUG to less than or equal to 15 seconds for improved functional mobility and decreased fall risk. Baseline: 17.88 sec with SBQC (9/25) Goal status: INITIAL  5.  Pt will ambulate greater than or equal to 1000 feet on with LRAD and SBA for improved cardiovascular endurance and BLE strength.  Baseline: 581 ft with [facility] and CGA (9/25), 755 feet w/ rollator and SBA (10/15)  6.  Pt will improve FGA to 24/30 for decreased fall risk   Baseline: 15/30 Goal status: REVISED   ASSESSMENT:  CLINICAL IMPRESSION:  Emphasis of skilled PT session on global endurance, single leg balance, backwards ambulation, and righting reactions  to perturbations. Pt's heels regularly hit 4" hurdles when stepping backwards and laterally. Pt with good righting reactions to perturbations on compliant surface, only requiring UE assistance x2 to recover, specifically when nudged to the R. Continue POC.   OBJECTIVE IMPAIRMENTS: Abnormal gait, cardiopulmonary status limiting activity, decreased activity tolerance, decreased balance, decreased endurance, decreased knowledge of use of DME, difficulty walking, decreased ROM, decreased strength, impaired perceived functional ability, increased muscle spasms, impaired UE functional use, and pain.   ACTIVITY LIMITATIONS: carrying, lifting, bending, standing, squatting, stairs, and transfers  PARTICIPATION LIMITATIONS: driving  and community activity  PERSONAL FACTORS: Age, Time since onset of injury/illness/exacerbation, and 3+ comorbidities:    hypertension, hyperlipidemia, chronic kidney disease, prostate cancer are also affecting patient's functional outcome.   REHAB POTENTIAL: Good  CLINICAL DECISION MAKING: Stable/uncomplicated  EVALUATION COMPLEXITY: Low  PLAN:  PT FREQUENCY: 2x/week  PT DURATION: 6 weeks  PLANNED INTERVENTIONS: Therapeutic exercises, Therapeutic activity, Neuromuscular re-education, Balance training, Gait training, Patient/Family education, Self Care, Joint mobilization, Stair training, Vestibular training, Canalith repositioning, Visual/preceptual remediation/compensation, Orthotic/Fit training, DME instructions, Aquatic Therapy, Dry Needling, Electrical stimulation, Cryotherapy, Moist heat, Taping, Manual therapy, and Re-evaluation  PLAN FOR NEXT SESSION: add to HEP as appropriate to work on RLE NMR, strengthening, endurance. Postural control, stepping reaction for balance strategies, activities for increasing gait speed and endurance- treadmill training?, narrow BOS, eyes closed, backwards ambulation  Beverely Low, SPT   05/16/2023, 11:07 AM

## 2023-05-16 NOTE — Telephone Encounter (Signed)
I called pt and LMVM for him to return call. About the baclofen request that I received via fax from [employer] [facility].

## 2023-05-16 NOTE — Telephone Encounter (Signed)
I called pt and relayed that we received a fax about [employer] [facility] asking for 90 day supply of baclofen.  I relayed that I did not think pt was taking this as did not help.  He said that was correct.  I relayed will disregard the fax.  He appreciated call back.

## 2023-05-21 ENCOUNTER — Ambulatory Visit: Payer: Medicare Other | Admitting: Occupational Therapy

## 2023-05-21 ENCOUNTER — Ambulatory Visit: Payer: Medicare Other | Admitting: Physical Therapy

## 2023-05-21 VITALS — BP 141/79 | HR 68

## 2023-05-21 DIAGNOSIS — R208 Other disturbances of skin sensation: Secondary | ICD-10-CM

## 2023-05-21 DIAGNOSIS — R2681 Unsteadiness on feet: Secondary | ICD-10-CM

## 2023-05-21 DIAGNOSIS — R278 Other lack of coordination: Secondary | ICD-10-CM

## 2023-05-21 DIAGNOSIS — R2689 Other abnormalities of gait and mobility: Secondary | ICD-10-CM

## 2023-05-21 DIAGNOSIS — M6281 Muscle weakness (generalized): Secondary | ICD-10-CM

## 2023-05-21 DIAGNOSIS — I69351 Hemiplegia and hemiparesis following cerebral infarction affecting right dominant side: Secondary | ICD-10-CM

## 2023-05-21 NOTE — Therapy (Signed)
OUTPATIENT PHYSICAL THERAPY NEURO TREATMENT   Patient Name: Jerome Cardenas MRN: [id number] DOB:26-Aug-1950, 72 y.o., male Today's Date: 05/21/2023   PCP: Kerin Salen, PA-C REFERRING PROVIDER: Butch Penny, NP    END OF SESSION:  PT End of Session - 05/21/23 1014     Visit Number 12    Number of Visits 13    Date for PT Re-Evaluation 05/29/23    Authorization Type Medicare    PT Start Time 1014    PT Stop Time 1056    PT Time Calculation (min) 42 min    Equipment Utilized During Treatment Gait belt    Activity Tolerance Patient tolerated treatment well    Behavior During Therapy WFL for tasks assessed/performed                     Past Medical History:  Diagnosis Date   Cancer (HCC)    prostate   CKD (chronic kidney disease) stage 2, GFR 60-89 ml/min 07/28/2022   HLD (hyperlipidemia)    Hypertension    Past Surgical History:  Procedure Laterality Date   APPENDECTOMY     CHOLECYSTECTOMY     HERNIA REPAIR     PROSTATE SURGERY     prostate cancer 2014   Patient Active Problem List   Diagnosis Date Noted   Diabetes mellitus type 2 with neurological manifestations (HCC) 07/29/2022   Hypertension associated with diabetes (HCC) 07/29/2022   Hyperlipidemia associated with type 2 diabetes mellitus (HCC) 07/29/2022   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    ONSET DATE: 03/26/2023  REFERRING DIAG:  Diagnosis  I63.81 (ICD-10-CM) - Left thalamic infarction (HCC)  I69.398,R26.9 (ICD-10-CM) - Abnormality of gait as late effect of stroke    THERAPY DIAG:  Muscle weakness (generalized)  Other abnormalities of gait and mobility  Unsteadiness on feet  Rationale for Evaluation and Treatment: Rehabilitation  SUBJECTIVE:                                                                                                                                                                                             SUBJECTIVE STATEMENT:  Pt declines  any acute changes since last visit, no complaints of pain today.  Pt accompanied by: self  PERTINENT HISTORY: PMH: hypertension, hyperlipidemia, chronic kidney disease, prostate cancer  PAIN:  Are you having pain? No  No pain currently but does get pain in his R arm at times.  PRECAUTIONS: Fall  RED FLAGS: None   WEIGHT BEARING RESTRICTIONS: No  FALLS: Has patient fallen in last 6 months? Yes. 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  PLOF: Independent with gait, Independent with transfers, and Requires assistive device for independence  PATIENT GOALS: "hopefully get more feeling back into my leg and my arm"  OBJECTIVE:   DIAGNOSTIC FINDINGS: None relevant to this POC (no new imaging since CVA in Jan 2024)  COGNITION: Overall cognitive status: Within functional limits for tasks assessed   SENSATION: Pt reports ongoing N/T in R hemibody Light touch appears intact  POSTURE: rounded shoulders, forward head, and posterior pelvic tilt  LOWER EXTREMITY ROM:     Active  Right Eval Left Eval  Hip flexion    Hip extension    Hip abduction    Hip adduction    Hip internal rotation    Hip external rotation    Knee flexion    Knee extension Tight HS   Ankle dorsiflexion    Ankle plantarflexion    Ankle inversion    Ankle eversion     (Blank rows = not tested)  LOWER EXTREMITY MMT:    MMT Right Eval Left Eval  Hip flexion 3 4+  Hip extension    Hip abduction    Hip adduction    Hip internal rotation    Hip external rotation    Knee flexion 2 4+  Knee extension 3 4+  Ankle dorsiflexion 5 5  Ankle plantarflexion    Ankle inversion    Ankle eversion    (Blank rows = not tested)    TODAY'S TREATMENT:  Ther Act   Vitals:   05/21/23 1017  BP: (!) 141/79  Pulse: 68  Seated BP assessed in LUE at beginning of session   For initiation of LTG assessment:  [facility] PT Assessment - 05/21/23 1020       Ambulation/Gait   Gait velocity 32.8 ft over 14.59 sec = 2.25 ft/sec      Standardized Balance Assessment   Standardized Balance Assessment Five Times Sit to Stand    Five times sit to stand comments  11.8 sec   no UE            NMR In // bars with no UE support and resistance with blue band to work on dynamic balance and LE strengthening: Backwards gait 6 x 10 ft Pt with several LOB backwards, needs CGA to recover Lateral gait 3 x 10 ft L/R  In // bars with no UE support to work on increasing step length, step height, and for LE strengthening: Alt L/R stepping over 6" hurdles 6 x 10 ft Added in 4# ankle weights Initially with increased difficulty completing task without knocking over hurdles but improved performance as task progresses  In // bars with no UE support to work on dynamic balance across compliant surface and SLS: Alt L/R gumdrop taps with ambulation across blue mat while wearing 4# ankle weights Forwards gait 6 x 10 ft Cues to increase step length vs taking shuffling steps Lateral gait 3 x 10 ft L/R   PATIENT EDUCATION:  Education details: continue HEP, plan to d/c from PT next visit, results of OM and functional implications Person educated: Patient Education method: Explanation Education comprehension: verbalized understanding and needs further education  HOME EXERCISE PROGRAM:  From Previous POC: Access Code: [email or web address] URL: [email or web address] Date: 04/10/2023 Prepared by: Alethia Berthold Plaster  Exercises -  Feet together with eyes closed and head turns   - 1 x daily - 7 x weekly - 3-4 reps - 20-30 second hold - Tandem Stance in Corner  - 1 x daily - 7 x weekly - 3-4 reps - 30 second  hold - Squat with Chair Touch  - 1 x daily - 7 x weekly - 3 sets - 6-8  reps - Supine Posterior Pelvic Tilt  - 1 x daily - 7 x weekly - 1 sets - 3 reps - 10 second hold - Supine Bridge  - 1 x daily - 7 x weekly - 1 sets - 8 reps - 5 seconds hold - Supine March  - 1 x daily - 7 x weekly - 2 sets - 20 reps - 1-2 second hold - Clamshell  - 1 x daily - 7 x weekly - 3 sets - 10 reps  GOALS: Goals reviewed with patient? Yes  SHORT TERM GOALS: Target date: 04/24/2023   Pt will be independent with initial HEP for improved strength, balance, transfers and gait. Baseline: no HEP Goal status: MET  2.  Pt will improve 5 x STS to less than or equal to 23 seconds to demonstrate improved functional strength and transfer efficiency.  Baseline: 28.19 sec no UE (9/25), 14.75 sec no UE (10/15) Goal status: MET  3.  Pt will improve gait velocity to at least 1.5 ft/sec for improved gait efficiency and performance at SBA level  Baseline: 1.28 ft/sec with [facility] and SBA (9/25), 1.81 ft/sec w/ rollator and SBA (10/15) Goal status: MET  4.  Pt will ambulate greater than or equal to 750 feet on with LRAD and SBA for improved cardiovascular endurance and BLE strength.  Baseline: 581 ft with [facility] and CGA (9/25), 755 feet w/ rollator and SBA (10/15) Goal status: MET  5.  Pt will improve FGA to 19/30 for decreased fall risk   Baseline: 15/30, 15/30 (10/15) Goal status: NOT MET    LONG TERM GOALS: Target date: 05/23/2023  Pt will be independent with final HEP for improved strength, balance, transfers and gait. Baseline:  Goal status: INITIAL  2.  Pt will improve 5 x STS to less than or equal to 12 seconds to demonstrate improved functional strength and transfer efficiency.  Baseline: 28.19 sec no UE (9/25), 14.75 sec no UE (10/15), 11.8 sec no UE (11/12) Goal status: MET  3.  Pt will improve gait velocity to at least 2.00 ft/sec for improved gait efficiency and performance at SBA level  Baseline: 1.28 ft/sec with [facility] and SBA (9/25), 1.81 ft/sec w/ rollator and SBA  (10/15), 2.25 ft/sec no AD (11/12) Goal status: MET  4.  Pt will improve normal TUG to less than or equal to 15 seconds for improved functional mobility and decreased fall risk. Baseline: 17.88 sec with SBQC (9/25) Goal status: INITIAL  5.  Pt will ambulate greater than or equal to 1000 feet on with LRAD and SBA for improved cardiovascular endurance and BLE strength.  Baseline: 581 ft with [facility] and CGA (9/25), 755 feet w/ rollator and SBA (10/15)  6.  Pt will improve FGA to 24/30 for decreased fall risk   Baseline: 15/30 Goal status: REVISED   ASSESSMENT:  CLINICAL IMPRESSION:  Emphasis of skilled PT session on initiating assessment of LTG in preparation for d/c from OPPT services next visit. Pt has met 2/2 LTG assessed this session, will assess remaining goals next visit. Pt exhibits improved functional LE strength  and decreased fall risk and improved return to functional mobility based on scores this date on 5xSTS and gait speed. Pt continues to exhibit impaired dynamic standing balance and difficulty clearing LE over 6" obstacles but does show an improvement in his performance as session progresses. Pt can benefit from one more PT session to assess remaining LTG and to review his final HEP. Continue POC.   OBJECTIVE IMPAIRMENTS: Abnormal gait, cardiopulmonary status limiting activity, decreased activity tolerance, decreased balance, decreased endurance, decreased knowledge of use of DME, difficulty walking, decreased ROM, decreased strength, impaired perceived functional ability, increased muscle spasms, impaired UE functional use, and pain.   ACTIVITY LIMITATIONS: carrying, lifting, bending, standing, squatting, stairs, and transfers  PARTICIPATION LIMITATIONS: driving and community activity  PERSONAL FACTORS: Age, Time since onset of injury/illness/exacerbation, and 3+ comorbidities:    hypertension, hyperlipidemia, chronic kidney disease, prostate cancer are also affecting  patient's functional outcome.   REHAB POTENTIAL: Good  CLINICAL DECISION MAKING: Stable/uncomplicated  EVALUATION COMPLEXITY: Low  PLAN:  PT FREQUENCY: 2x/week  PT DURATION: 6 weeks  PLANNED INTERVENTIONS: Therapeutic exercises, Therapeutic activity, Neuromuscular re-education, Balance training, Gait training, Patient/Family education, Self Care, Joint mobilization, Stair training, Vestibular training, Canalith repositioning, Visual/preceptual remediation/compensation, Orthotic/Fit training, DME instructions, Aquatic Therapy, Dry Needling, Electrical stimulation, Cryotherapy, Moist heat, Taping, Manual therapy, and Re-evaluation  PLAN FOR NEXT SESSION: assess remaining LTG and d/c  Peter Congo, PT, DPT, CSRS   05/21/2023, 10:58 AM

## 2023-05-21 NOTE — Patient Instructions (Addendum)
Bag Exercises:  Small trash bag or produce bag works best.  For all exercises, sit with big posture (sit up tall with head up) and use big movements. Perform the following exercises 1 times per day.  Hold bag in one hand. Stretch both arms/hands out to the side as big as you can. Then, pass bag from one hand to the other IN FRONT of you. Stretch arms back out big after each pass. Repeat 10 times. Hold bag in one hand. Stretch both arms/hands out to the side as big as you can. Then, pass bag from one hand to the other BEHIND you. Stretch arms back out big after each pass. Repeat 10 times. Hold bag in right hand. Move right hand to reach behind shoulder. Then, reach behind back with left hand to pass bag from right hand to left hand. Switch sides. Repeat 5-10 times on each side. Hold bag in both hands in front of you with hands/arms shoulder length apart. Move bag behind your head. Repeat 10 times.  Challenge: Hold bag in both hands in front of you with hands/arms shoulder length apart. Lift leg and move bag completely under each foot and back. Repeat 5 times on each side.

## 2023-05-21 NOTE — Therapy (Addendum)
OUTPATIENT OCCUPATIONAL THERAPY NEURO TREATMENT  Patient Name: Jerome Cardenas MRN: [id number] DOB:[birth year], 72 y.o., male Today's Date: 05/21/2023  PCP: Kerin Salen, PA-C REFERRING PROVIDER: Butch Penny, NP  END OF SESSION:  OT End of Session - 05/21/23 1125     Visit Number 3    Number of Visits 8    Date for OT Re-Evaluation 06/23/23    Authorization Type MCR, [employer]    Progress Note Due on Visit 10    OT Start Time 0931    OT Stop Time 1013    OT Time Calculation (min) 42 min    Activity Tolerance Patient tolerated treatment well    Behavior During Therapy [facility] for tasks assessed/performed              Past Medical History:  Diagnosis Date   Cancer (HCC)    prostate   CKD (chronic kidney disease) stage 2, GFR 60-89 ml/min 07/28/2022   HLD (hyperlipidemia)    Hypertension    Past Surgical History:  Procedure Laterality Date   APPENDECTOMY     CHOLECYSTECTOMY     HERNIA REPAIR     PROSTATE SURGERY     prostate cancer 2014   Patient Active Problem List   Diagnosis Date Noted   Diabetes mellitus type 2 with neurological manifestations (HCC) 07/29/2022   Hypertension associated with diabetes (HCC) 07/29/2022   Hyperlipidemia associated with type 2 diabetes mellitus (HCC) 07/29/2022   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    ONSET DATE: 04/08/2023 (referral date)   REFERRING DIAG: M62.81 (ICD-10-CM) - Muscle weakness of right upper extremity  Note:  Right upper extremity weakness hand weakness  THERAPY DIAG:  Muscle weakness (generalized)  Other lack of coordination  Other disturbances of skin sensation  Hemiplegia and hemiparesis following cerebral infarction affecting right dominant side (HCC)  Rationale for Evaluation and Treatment: Rehabilitation  SUBJECTIVE:   SUBJECTIVE STATEMENT: Pt reports things are not bad. Pt reports feeling better compared to last session. Pt reports handwriting "is a little better" and pt has been  practicing. Pt politely declined to review theraputty exercises and reports the theraputty exercises are going well.  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 PMH: Lt thalamic stroke in Jan 2024, hypertension, hyperlipidemia, chronic kidney disease, prostate cancer   PRECAUTIONS: Fall, active CA  WEIGHT BEARING RESTRICTIONS: No  PAIN:  Are you having pain? No, but inconsistent reports at last admission re: pain RUE  FALLS: Has patient fallen in last 6 months? Yes. Number of falls 1  LIVING ENVIRONMENT: Lives with: lives with their spouse Lives in:2 STORY HOUSE, bedroom on 2nd floor, 14 steps total to bedroom (7 to platform, 7 more to 2nd floor) Has following equipment at home: Quad cane small base, shower chair, and rollator  PLOF: Independent and retired  PATIENT GOALS: get my Rt hand better  OBJECTIVE:  Note: Objective measures were completed at Evaluation unless otherwise noted.  HAND DOMINANCE: Right  ADLs: Eating: independent  Grooming: independent UB Dressing: independent  LB Dressing: independent Toileting: independent Bathing: independent Tub Shower transfers: independent  Equipment: Grab bars  IADLs: Shopping: wife performs Light housekeeping: wife performs Meal Prep: wife performs Community mobility: pt driving Medication management: independent per pt report Landscape architect: wife performs Handwriting: 90% legible in print for name and short sentence, pt reports he has always written smaller but at last admission he said his handwriting had gotten smaller  MOBILITY STATUS:  use rollator in community, uses quad cane in the house  UPPER EXTREMITY ROM:  LUE AROM WNL's but still required cues for full sh flexion. RUE slightly limited shoulder flexion and elbow extension against gravity from stroke. Elbow distally WFL's  UPPER EXTREMITY MMT:   Rt shoulder 3/5, Lt shoulder 4/5    HAND FUNCTION: Grip strength: Right: 34.6 lbs (declined from 77 lbs at last admission); Left: 74.2 lbs  COORDINATION: 9 Hole Peg test: Right: 30 sec; Left: 24 sec  SENSATION: Light touch: WFL Pt reports RUE feels cold in the morning compared to LUE  EDEMA: none   COGNITION: Overall cognitive status:  pt inconsistent with reports of function/ability, pain and poor health advocate for himself  VISION: Subjective report: vision is ok Baseline vision: Bifocals and Wears glasses all the time Visual history:  N/A   PERCEPTION: Not tested  PRAXIS: Not tested  OBSERVATIONS: Pt with significant decline in grip strength Rt hand since last admission. Pt is the same with coordination Rt hand. Pt with fluctuating balance.   TODAY'S TREATMENT:                                                                                                                               Therapeutic Activities: OT initiated HEP: Bag exercises - 10 reps each - handout provided, see pt instructions - to increase BUE amplitude, increase ROM, RUE strengthening -  Pt benefited from therapist modeling and v/c to extend elbows fully, especially R elbow, and for big movements.   Handwriting - to improve amplitude for handwriting, prevent micrographia, improve FM coordination - Pt wrote simple sentences and demo'd smaller letters near end of sentences. Pt benefited v/c and visual cues on page to maintain size of letters throughout task, especially when using 3-lined paper (middle dotted line). OT recommended to pt to practice handwriting at home using 3-lined paper, pt verbalized understanding. Pt stated he already had 3-lined paper at home and therefore politely declined additional copies today.  Therapeutic Exercises OT initiated BUE HEP (red theraband) - Access Code: [email or web address] - to increase BUE strengthening, coordination, proprioception, amplitude for "big movements" - handout provided with handwritten adjustments,  see pt instructions URL: [email or web address] Date: 05/21/2023 Prepared by: Carilyn Goodpasture  Exercises - Pt benefited from v/c and tactile cues and therapist modeling to promote  "slow and controlled" movements and upright seated posture - Pt's handout updated to complete all tasks seated: - seated extension with resistance - Neutral  - 1 sets - 10 reps - seated Shoulder Horizontal Abduction with Resistance  - 1 sets - 10 reps - seated External Rotation and Scapular Retraction with Resistance - 1 sets - 10 reps - seated Single Arm Elbow Flexion with Resistance  - 1 sets - 10 reps - handout updated to hold theraband in opposite hand. - Seated Shoulder Row with Anchored Resistance - 1 sets - 10 reps  PATIENT EDUCATION: Education details: see today's treatment above,  upright seated posture, prevent forward head flexion during exercises, slow and controlled movements, big movements Person educated: Patient Education method: Explanation, Demonstration, Verbal cues, and Handouts Education comprehension: verbalized understanding, returned demonstration, verbal cues required, tactile cues required, and needs further education  HOME EXERCISE PROGRAM: 10/31: handwriting strategies 05/21/23 - Bag exercises (handout, see pt instructions). Red Theraband Exercises for BUE - Seated. Access Code: [email or web address] (handwritten adjustments to pt's handouts to complete all exercises seated, see pt instructions.)  GOALS: Goals reviewed with patient? Yes   LONG TERM GOALS: Target date: 06/23/23 (extended date d/t scheduling conflicts)   Independent with HEP for RUE shoulder strength and grip strength Baseline:  Goal status: INITIAL  2.  Rt grip strength to improve by 10 lbs or greater Baseline: 34 lbs Goal status: INITIAL  3.  Pt to be issued strategies for writing bigger Baseline:  Goal status: INITIAL  ASSESSMENT:  CLINICAL IMPRESSION: Patient able to return demonstration of RUE and BUE  strengthening and coordination HEP and verbalizes understanding of handwriting strategies. Pt benefited from v/c and therapist modeling to increase amplitude of movements. Pt would benefit from PWR! Hands given hand changes and affects on functional use. Pt would benefit from skilled OT services to address deficits, increase overall independence, and return patient to PLOF as able.    PERFORMANCE DEFICITS: in functional skills including IADLs, coordination, ROM, strength, pain, Fine motor control, mobility, balance, and UE functional use, cognitive skills including memory and safety awareness.   IMPAIRMENTS: are limiting patient from IADLs, leisure, and social participation.   CO-MORBIDITIES: has co-morbidities such as cancer  that affects occupational performance. Patient will benefit from skilled OT to address above impairments and improve overall function.  REHAB POTENTIAL: Good  PLAN:  OT FREQUENCY: 2x/week  OT DURATION: 4 weeks  PLANNED INTERVENTIONS: 97535 self care/ADL training, 56213 therapeutic exercise, 97530 therapeutic activity, 97112 neuromuscular re-education, 97140 manual therapy, passive range of motion, functional mobility training, energy conservation, patient/family education, and DME and/or AE instructions  RECOMMENDED OTHER SERVICES: none at this time  CONSULTED AND AGREED WITH PLAN OF CARE: Patient  PLAN FOR NEXT SESSION: PWR hands? Continue to review previously issued HEPs from earlier admission and review and/or update prn; flexbar   Wynetta Emery, OT 05/21/2023, 11:36 AM

## 2023-05-23 ENCOUNTER — Ambulatory Visit: Payer: Medicare Other | Admitting: Occupational Therapy

## 2023-05-23 ENCOUNTER — Ambulatory Visit: Payer: Medicare Other | Admitting: Physical Therapy

## 2023-05-23 VITALS — BP 126/64 | HR 62

## 2023-05-23 DIAGNOSIS — R278 Other lack of coordination: Secondary | ICD-10-CM

## 2023-05-23 DIAGNOSIS — M6281 Muscle weakness (generalized): Secondary | ICD-10-CM | POA: Diagnosis not present

## 2023-05-23 DIAGNOSIS — I69351 Hemiplegia and hemiparesis following cerebral infarction affecting right dominant side: Secondary | ICD-10-CM

## 2023-05-23 DIAGNOSIS — R208 Other disturbances of skin sensation: Secondary | ICD-10-CM

## 2023-05-23 DIAGNOSIS — R2689 Other abnormalities of gait and mobility: Secondary | ICD-10-CM

## 2023-05-23 DIAGNOSIS — R2681 Unsteadiness on feet: Secondary | ICD-10-CM

## 2023-05-23 NOTE — Therapy (Signed)
OUTPATIENT OCCUPATIONAL THERAPY NEURO TREATMENT  Patient Name: Jerome Cardenas MRN: [id number] DOB:[birth year], 72 y.o., male Today's Date: 05/23/2023  PCP: Kerin Salen, PA-C REFERRING PROVIDER: Butch Penny, NP  END OF SESSION:  OT End of Session - 05/23/23 1244     Visit Number 4    Number of Visits 8    Date for OT Re-Evaluation 06/23/23    Authorization Type MCR, [employer]    Progress Note Due on Visit 10    OT Start Time 0935    OT Stop Time 1013    OT Time Calculation (min) 38 min    Activity Tolerance Patient tolerated treatment well    Behavior During Therapy [facility] for tasks assessed/performed               Past Medical History:  Diagnosis Date   Cancer (HCC)    prostate   CKD (chronic kidney disease) stage 2, GFR 60-89 ml/min 07/28/2022   HLD (hyperlipidemia)    Hypertension    Past Surgical History:  Procedure Laterality Date   APPENDECTOMY     CHOLECYSTECTOMY     HERNIA REPAIR     PROSTATE SURGERY     prostate cancer 2014   Patient Active Problem List   Diagnosis Date Noted   Diabetes mellitus type 2 with neurological manifestations (HCC) 07/29/2022   Hypertension associated with diabetes (HCC) 07/29/2022   Hyperlipidemia associated with type 2 diabetes mellitus (HCC) 07/29/2022   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    ONSET DATE: 04/08/2023 (referral date)   REFERRING DIAG: M62.81 (ICD-10-CM) - Muscle weakness of right upper extremity  Note:  Right upper extremity weakness hand weakness  THERAPY DIAG:  Muscle weakness (generalized)  Other lack of coordination  Other disturbances of skin sensation  Hemiplegia and hemiparesis following cerebral infarction affecting right dominant side (HCC)  Rationale for Evaluation and Treatment: Rehabilitation  SUBJECTIVE:   SUBJECTIVE STATEMENT: Pt denies changes to medication, recent falls, pain. Pt reports things "are not really good" d/t digestive issues.  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 PMH: Lt thalamic stroke in Jan 2024, hypertension, hyperlipidemia, chronic kidney disease, prostate cancer   PRECAUTIONS: Fall, active CA  WEIGHT BEARING RESTRICTIONS: No  PAIN:  Are you having pain? No, but inconsistent reports at last admission re: pain RUE  FALLS: Has patient fallen in last 6 months? Yes. Number of falls 1  LIVING ENVIRONMENT: Lives with: lives with their spouse Lives in:2 STORY HOUSE, bedroom on 2nd floor, 14 steps total to bedroom (7 to platform, 7 more to 2nd floor) Has following equipment at home: Quad cane small base, shower chair, and rollator  PLOF: Independent and retired  PATIENT GOALS: get my Rt hand better  OBJECTIVE:  Note: Objective measures were completed at Evaluation unless otherwise noted.  HAND DOMINANCE: Right  ADLs: Eating: independent  Grooming: independent UB Dressing: independent  LB Dressing: independent Toileting: independent Bathing: independent Tub Shower transfers: independent  Equipment: Grab bars  IADLs: Shopping: wife performs Light housekeeping: wife performs Meal Prep: wife performs Community mobility: pt driving Medication management: independent per pt report Landscape architect: wife performs Handwriting: 90% legible in print for name and short sentence, pt reports he has always written smaller but at last admission he said his handwriting had gotten smaller  MOBILITY STATUS:  use rollator in community, uses quad cane in the house  UPPER EXTREMITY ROM:  LUE AROM WNL's but still required cues  for full sh flexion. RUE slightly limited shoulder flexion and elbow extension against gravity from stroke. Elbow distally WFL's  UPPER EXTREMITY MMT:   Rt shoulder 3/5, Lt shoulder 4/5   HAND FUNCTION: Grip strength: Right: 34.6 lbs (declined from 77 lbs at last admission); Left: 74.2 lbs  COORDINATION: 9 Hole Peg test:  Right: 30 sec; Left: 24 sec  SENSATION: Light touch: WFL Pt reports RUE feels cold in the morning compared to LUE  EDEMA: none   COGNITION: Overall cognitive status:  pt inconsistent with reports of function/ability, pain and poor health advocate for himself  VISION: Subjective report: vision is ok Baseline vision: Bifocals and Wears glasses all the time Visual history:  N/A   PERCEPTION: Not tested  PRAXIS: Not tested  OBSERVATIONS: Pt with significant decline in grip strength Rt hand since last admission. Pt is the same with coordination Rt hand. Pt with fluctuating balance.   TODAY'S TREATMENT:                                                                                                                               Self care: Reviewed cape method to don jacket (handout provided, see pt instructions), standing with gait belt - to increase amplitude of movements, practice alternate methods for donning/doffing jacket -  Pt demo'd understanding of strategy for donning jacket. Pt benefited from v/c for "big movements" and wide base of support.  TherEx: Reviewed previous HEP - to increase BUE strengthening, BUE AROM, trunk stability, core stability. Handout provided, see pt instructions. With gait belt: Lumbar rotation bilateral (supine) - 5 reps each side, hold 10 seconds. V/c to decrease speed. Overhead arm extension (supine) - 10 reps Wand ABD - 10 reps each side, hold 3 seconds - V/c to decrease speed, straighten L elbow, wide base of support. Cane shoulder EXT - 10 reps, hold 5 seconds - V/c for upright standing posture to avoid leaning to L side, extend arms same distance to keep cane straight, wide base of support.   PATIENT EDUCATION: Education details: see today's treatment above, upright standing posture, slow and controlled movements, big movements, wide base of support Person educated: Patient Education method: Explanation, Demonstration, Verbal cues, and  Handouts Education comprehension: verbalized understanding, returned demonstration, verbal cues required, tactile cues required, and needs further education  HOME EXERCISE PROGRAM: 10/31: handwriting strategies 05/21/23 - Bag exercises (handout, see pt instructions). Red Theraband Exercises for BUE - Seated. Access Code: [email or web address] (handwritten adjustments to pt's handouts to complete all exercises seated, see pt instructions.) 05/23/23 - cape method to don/doff jacket, lumbar rotation bilateral (supine), Overhead arm extension (supine), Wand ABD, Cane shoulder EXT (handouts provided, see pt instructions  GOALS: Goals reviewed with patient? Yes   LONG TERM GOALS: Target date: 06/23/23 (extended date d/t scheduling conflicts)   Independent with HEP for RUE shoulder strength and grip strength Baseline:  Goal status: INITIAL  2.  Rt grip strength to improve by  10 lbs or greater Baseline: 34 lbs Goal status: INITIAL  3.  Pt to be issued strategies for writing bigger Baseline:  Goal status: INITIAL  ASSESSMENT:  CLINICAL IMPRESSION: Pt tolerated tasks well. Pt benefited from v/c and therapist modeling to improve standing posture and to improve positioning of BUE during tasks. Pt would benefit from PWR! Hands given hand changes and affects on functional use. Pt would benefit from skilled OT services to address deficits, increase overall independence, and return patient to PLOF as able.    PERFORMANCE DEFICITS: in functional skills including IADLs, coordination, ROM, strength, pain, Fine motor control, mobility, balance, and UE functional use, cognitive skills including memory and safety awareness.   IMPAIRMENTS: are limiting patient from IADLs, leisure, and social participation.   CO-MORBIDITIES: has co-morbidities such as cancer  that affects occupational performance. Patient will benefit from skilled OT to address above impairments and improve overall function.  REHAB POTENTIAL:  Good  PLAN:  OT FREQUENCY: 2x/week  OT DURATION: 4 weeks  PLANNED INTERVENTIONS: 97535 self care/ADL training, 16109 therapeutic exercise, 97530 therapeutic activity, 97112 neuromuscular re-education, 97140 manual therapy, passive range of motion, functional mobility training, energy conservation, patient/family education, and DME and/or AE instructions  RECOMMENDED OTHER SERVICES: none at this time  CONSULTED AND AGREED WITH PLAN OF CARE: Patient  PLAN FOR NEXT SESSION: flexbar, large arc hand movements with cards and other manipulatives, PWR hands? Continue to review previously issued HEPs from earlier admission and review and/or update prn   Wynetta Emery, OT 05/23/2023, 12:51 PM

## 2023-05-23 NOTE — Therapy (Signed)
OUTPATIENT PHYSICAL THERAPY NEURO DISCHARGE   Patient Name: Jerome Cardenas MRN: [id number] DOB:1951/06/10, 72 y.o., male Today's Date: 05/23/2023  PHYSICAL THERAPY DISCHARGE SUMMARY  Visits from Start of Care: 13  Current functional level related to goals / functional outcomes: Mod I   Remaining deficits: Impaired dynamic balance and slow self-selected gait speed   Education / Equipment: HEP handout   Patient agrees to discharge. Patient goals were partially met. Patient is being discharged due to being pleased with the current functional level.    PCP: Kerin Salen, PA-C REFERRING PROVIDER: Butch Penny, NP    END OF SESSION:  PT End of Session - 05/23/23 1012     Visit Number 13    Number of Visits 13    Date for PT Re-Evaluation 05/29/23    Authorization Type Medicare    PT Start Time 1012    PT Stop Time 1047    PT Time Calculation (min) 35 min    Equipment Utilized During Treatment Gait belt    Activity Tolerance Patient tolerated treatment well    Behavior During Therapy WFL for tasks assessed/performed                      Past Medical History:  Diagnosis Date   Cancer (HCC)    prostate   CKD (chronic kidney disease) stage 2, GFR 60-89 ml/min 07/28/2022   HLD (hyperlipidemia)    Hypertension    Past Surgical History:  Procedure Laterality Date   APPENDECTOMY     CHOLECYSTECTOMY     HERNIA REPAIR     PROSTATE SURGERY     prostate cancer 2014   Patient Active Problem List   Diagnosis Date Noted   Diabetes mellitus type 2 with neurological manifestations (HCC) 07/29/2022   Hypertension associated with diabetes (HCC) 07/29/2022   Hyperlipidemia associated with type 2 diabetes mellitus (HCC) 07/29/2022   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    ONSET DATE: 03/26/2023  REFERRING DIAG:  Diagnosis  I63.81 (ICD-10-CM) - Left thalamic infarction (HCC)  I69.398,R26.9 (ICD-10-CM) - Abnormality of gait as late effect of  stroke    THERAPY DIAG:  Muscle weakness (generalized)  Unsteadiness on feet  Other abnormalities of gait and mobility  Hemiplegia and hemiparesis following cerebral infarction affecting right dominant side (HCC)  Rationale for Evaluation and Treatment: Rehabilitation  SUBJECTIVE:                                                                                                                                                                                             SUBJECTIVE STATEMENT:  Pt declines any  falls or acute changes since last visit, no complaints of pain today.  Pt accompanied by: self  PERTINENT HISTORY: PMH: hypertension, hyperlipidemia, chronic kidney disease, prostate cancer  PAIN:  Are you having pain? No  No pain currently but does get pain in his R arm at times.  PRECAUTIONS: Fall  RED FLAGS: None   WEIGHT BEARING RESTRICTIONS: No  FALLS: Has patient fallen in last 6 months? Yes. 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  PLOF: Independent with gait, Independent with transfers, and Requires assistive device for independence  PATIENT GOALS: "hopefully get more feeling back into my leg and my arm"  OBJECTIVE:   DIAGNOSTIC FINDINGS: None relevant to this POC (no new imaging since CVA in Jan 2024)  COGNITION: Overall cognitive status: Within functional limits for tasks assessed   SENSATION: Pt reports ongoing N/T in R hemibody Light touch appears intact  POSTURE: rounded shoulders, forward head, and posterior pelvic tilt  LOWER EXTREMITY ROM:     Active  Right Eval Left Eval  Hip flexion    Hip extension    Hip abduction    Hip adduction    Hip internal rotation    Hip external rotation    Knee  flexion    Knee extension Tight HS   Ankle dorsiflexion    Ankle plantarflexion    Ankle inversion    Ankle eversion     (Blank rows = not tested)  LOWER EXTREMITY MMT:    MMT Right Eval Left Eval  Hip flexion 3 4+  Hip extension    Hip abduction    Hip adduction    Hip internal rotation    Hip external rotation    Knee flexion 2 4+  Knee extension 3 4+  Ankle dorsiflexion 5 5  Ankle plantarflexion    Ankle inversion    Ankle eversion    (Blank rows = not tested)    TODAY'S TREATMENT:  Ther Act  Vitals:   05/23/23 1015  BP: 126/64  Pulse: 62   Seated BP assessed in LUE at beginning of session   For LTG assessment:  [facility] PT Assessment - 05/23/23 1035       6 minute walk test results    Aerobic Endurance Distance Walked 1093   RPE 10/10 at completion     Timed Up and Go Test   Normal TUG (seconds) 11.12   no AD     Functional Gait  Assessment   Gait assessed  Yes    Gait Level Surface Walks 20 ft, slow speed, abnormal gait pattern, evidence for imbalance or deviates 10-15 in outside of the 12 in walkway width. Requires more than 7 sec to ambulate 20 ft.   >7 seconds   Change in Gait Speed Able to smoothly change walking speed without loss of balance or gait deviation. Deviate no more than 6 in outside of the 12 in walkway width.    Gait with Horizontal Head Turns Performs head turns smoothly with no change in gait. Deviates no more than 6 in outside 12 in walkway width    Gait with Vertical Head Turns Performs task with slight change in gait velocity (eg, minor disruption to smooth gait path), deviates 6 - 10 in outside  12 in walkway width or uses assistive device    Gait and Pivot Turn Pivot turns safely within 3 sec and stops quickly with no loss of balance.    Step Over Obstacle Is able to step over 2 stacked shoe boxes taped together (9 in total height) without changing gait speed. No evidence of imbalance.    Gait with Narrow Base of Support Ambulates less  than 4 steps heel to toe or cannot perform without assistance.   can attain tandem stance but unwilling to try walking   Gait with Eyes Closed Walks 20 ft, slow speed, abnormal gait pattern, evidence for imbalance, deviates 10-15 in outside 12 in walkway width. Requires more than 9 sec to ambulate 20 ft.    Ambulating Backwards Walks 20 ft, slow speed, abnormal gait pattern, evidence for imbalance, deviates 10-15 in outside 12 in walkway width.    Steps Alternating feet, must use rail.    Total Score 19              TUG w/ rollator and CGA: 17.07 seconds  PATIENT EDUCATION:  Education details: continue HEP, results of outcome measures and LTGs Person educated: Patient Education method: Explanation Education comprehension: verbalized understanding and needs further education  HOME EXERCISE PROGRAM:  From Previous POC: Access Code: [email or web address] URL: [email or web address] Date: 04/10/2023 Prepared by: Alethia Berthold Plaster  Exercises - Feet together with eyes closed and head turns   - 1 x daily - 7 x weekly - 3-4 reps - 20-30 second hold - Tandem Stance in Corner  - 1 x daily - 7 x weekly - 3-4 reps - 30 second  hold - Squat with Chair Touch  - 1 x daily - 7 x weekly - 3 sets - 6-8 reps - Supine Posterior Pelvic Tilt  - 1 x daily - 7 x weekly - 1 sets - 3 reps - 10 second hold - Supine Bridge  - 1 x daily - 7 x weekly - 1 sets - 8 reps - 5 seconds hold - Supine March  - 1 x daily - 7 x weekly - 2 sets - 20 reps - 1-2 second hold - Clamshell  - 1 x daily - 7 x weekly - 3 sets - 10 reps  GOALS: Goals reviewed with patient? Yes  SHORT TERM GOALS: Target date: 04/24/2023   Pt will be independent with initial HEP for improved strength, balance, transfers and gait. Baseline: no HEP Goal status: MET  2.  Pt will improve 5 x STS to less than or equal to 23 seconds to demonstrate improved functional strength and transfer efficiency.  Baseline: 28.19 sec no UE (9/25), 14.75 sec  no UE (10/15) Goal status: MET  3.  Pt will improve gait velocity to at least 1.5 ft/sec for improved gait efficiency and performance at SBA level  Baseline: 1.28 ft/sec with [facility] and SBA (9/25), 1.81 ft/sec w/ rollator and SBA (10/15) Goal status: MET  4.  Pt will ambulate greater than or equal to 750 feet on with LRAD and SBA for improved cardiovascular endurance and BLE strength.  Baseline: 581 ft with [facility] and CGA (9/25), 755 feet w/ rollator and SBA (10/15) Goal status: MET  5.  Pt will improve FGA to 19/30 for decreased fall risk   Baseline: 15/30, 15/30 (10/15) Goal status: NOT MET    LONG TERM GOALS: Target date: 05/23/2023   Pt will be independent with final HEP for improved strength, balance, transfers and gait. Baseline: reports  doming some of the exercises Goal status: MET  2.  Pt will improve 5 x STS to less than or equal to 12 seconds to demonstrate improved functional strength and transfer efficiency.  Baseline: 28.19 sec no UE (9/25), 14.75 sec no UE (10/15), 11.8 sec no UE (11/12) Goal status: MET  3.  Pt will improve gait velocity to at least 2.00 ft/sec for improved gait efficiency and performance at SBA level  Baseline: 1.28 ft/sec with [facility] and SBA (9/25), 1.81 ft/sec w/ rollator and SBA (10/15), 2.25 ft/sec no AD (11/12) Goal status: MET  4.  Pt will improve normal TUG to less than or equal to 15 seconds for improved functional mobility and decreased fall risk. Baseline: 17.88 sec with [facility] (9/25), 11.12s w/o AD and SBA Goal status: MET  5.  Pt will ambulate greater than or equal to 1000 feet on with LRAD and SBA for improved cardiovascular endurance and BLE strength.  Baseline: 581 ft with [facility] and CGA (9/25), 755 feet w/ rollator and SBA (10/15); 1093 feet w/ rollator and close SBA RPE 10/10 (11/14) Goal status: MET  6.  Pt will improve FGA to 24/30 for decreased fall risk   Baseline: 15/30; 19/30 (11/14) Goal status: NOT  MET  ASSESSMENT:  CLINICAL IMPRESSION:  Emphasis of skilled PT session on assessing LTGs for discharge. Pt has met 3/4 LTGs and not met 1/4 LTGs assessed this date. Pt with improved normal TUG w/o AD to 11.12 seconds, improved 6 minute walk test distance to 1093 ft w/ rollator, and improved FGA score to 19/30, though not meeting goal of 24/30; all score improvements indicate decreased fall risk. Pt with good understanding of HEP for discharge and instructed to continue walking and exercising at home to maintain strength and balance.    OBJECTIVE IMPAIRMENTS: Abnormal gait, cardiopulmonary status limiting activity, decreased activity tolerance, decreased balance, decreased endurance, decreased knowledge of use of DME, difficulty walking, decreased ROM, decreased strength, impaired perceived functional ability, increased muscle spasms, impaired UE functional use, and pain.   ACTIVITY LIMITATIONS: carrying, lifting, bending, standing, squatting, stairs, and transfers  PARTICIPATION LIMITATIONS: driving and community activity  PERSONAL FACTORS: Age, Time since onset of injury/illness/exacerbation, and 3+ comorbidities:    hypertension, hyperlipidemia, chronic kidney disease, prostate cancer are also affecting patient's functional outcome.   REHAB POTENTIAL: Good  CLINICAL DECISION MAKING: Stable/uncomplicated  EVALUATION COMPLEXITY: Low    Beverely Low, SPT    05/23/2023, 10:54 AM

## 2023-05-23 NOTE — Patient Instructions (Addendum)
To DON jacket:   1) Wide stance 2) hold facing tag out with hands big at sleeves 3) Swing around big like a cape to Rt side 4) Punch Rt arm in as you continue to pull behind you with Lt hand to Lt side 5) Punch Lt arm in   To DOFF jacket:   1) Wide stance 2) Hold big hands at belly button level 3) Turn and twist to Lt to take off Lt shoulder 4) Turn and twist to Rt to take off Rt shoulder 5) Reach big behind you and grab cuff of sleeve BIG and yank off one hand, then then ot

## 2023-05-28 ENCOUNTER — Ambulatory Visit: Payer: Medicare Other | Admitting: Occupational Therapy

## 2023-05-28 DIAGNOSIS — M6281 Muscle weakness (generalized): Secondary | ICD-10-CM

## 2023-05-28 DIAGNOSIS — R208 Other disturbances of skin sensation: Secondary | ICD-10-CM

## 2023-05-28 DIAGNOSIS — R278 Other lack of coordination: Secondary | ICD-10-CM

## 2023-05-28 DIAGNOSIS — I69351 Hemiplegia and hemiparesis following cerebral infarction affecting right dominant side: Secondary | ICD-10-CM

## 2023-05-28 NOTE — Therapy (Signed)
OUTPATIENT OCCUPATIONAL THERAPY NEURO TREATMENT  Patient Name: Jerome Cardenas MRN: [id number] DOB:14-Oct-1950, 72 y.o., male Today's Date: 05/28/2023  PCP: Kerin Salen, PA-C REFERRING PROVIDER: Butch Penny, NP  END OF SESSION:  OT End of Session - 05/28/23 1423     Visit Number 5    Number of Visits 8    Date for OT Re-Evaluation 06/23/23    Authorization Type MCR, [employer]    Progress Note Due on Visit 10    OT Start Time 1019    OT Stop Time 1058    OT Time Calculation (min) 39 min    Activity Tolerance Patient tolerated treatment well    Behavior During Therapy WFL for tasks assessed/performed                Past Medical History:  Diagnosis Date   Cancer (HCC)    prostate   CKD (chronic kidney disease) stage 2, GFR 60-89 ml/min 07/28/2022   HLD (hyperlipidemia)    Hypertension    Past Surgical History:  Procedure Laterality Date   APPENDECTOMY     CHOLECYSTECTOMY     HERNIA REPAIR     PROSTATE SURGERY     prostate cancer 2014   Patient Active Problem List   Diagnosis Date Noted   Diabetes mellitus type 2 with neurological manifestations (HCC) 07/29/2022   Hypertension associated with diabetes (HCC) 07/29/2022   Hyperlipidemia associated with type 2 diabetes mellitus (HCC) 07/29/2022   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    ONSET DATE: 04/08/2023 (referral date)   REFERRING DIAG: M62.81 (ICD-10-CM) - Muscle weakness of right upper extremity  Note:  Right upper extremity weakness hand weakness  THERAPY DIAG:  Muscle weakness (generalized)  Other lack of coordination  Other disturbances of skin sensation  Hemiplegia and hemiparesis following cerebral infarction affecting right dominant side (HCC)  Rationale for Evaluation and Treatment: Rehabilitation  SUBJECTIVE:   SUBJECTIVE STATEMENT: Pt reports exercises are going fairly well. Pt reports some things are good, some things bad. Pt reports poor sleep d/t interrupted sleep  to use the restroom. Pt reports feeling a lot better in general.  Pt reports receiving new medication from doctor yesterday though unable to remember name. OT recommended to pt to write down medication name and bring to next visit. Pt acknowledged understanding.  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 PMH: Lt thalamic stroke in Jan 2024, hypertension, hyperlipidemia, chronic kidney disease, prostate cancer   PRECAUTIONS: Fall, active CA  WEIGHT BEARING RESTRICTIONS: No  PAIN:  Are you having pain? No, but pt reports some pain RUE mostly at night  FALLS: Has patient fallen in last 6 months? Yes. Number of falls 1  LIVING ENVIRONMENT: Lives with: lives with their spouse Lives in:2 STORY HOUSE, bedroom on 2nd floor, 14 steps total to bedroom (7 to platform, 7 more to 2nd floor) Has following equipment at home: Quad cane small base, shower chair, and rollator  PLOF: Independent and retired  PATIENT GOALS: get my Rt hand better  OBJECTIVE:  Note: Objective measures were completed at Evaluation unless otherwise noted.  HAND DOMINANCE: Right  ADLs: Eating: independent  Grooming: independent UB Dressing: independent  LB Dressing: independent Toileting: independent Bathing: independent Tub Shower transfers: independent  Equipment: Grab bars  IADLs: Shopping: wife performs Light housekeeping: wife performs Meal Prep: wife performs Community mobility: pt driving Medication management: independent per pt report Financial management: wife performs Handwriting: 90% legible in print  for name and short sentence, pt reports he has always written smaller but at last admission he said his handwriting had gotten smaller  MOBILITY STATUS:  use rollator in community, uses quad cane in the house  UPPER EXTREMITY ROM:  LUE AROM WNL's but still required cues for full sh flexion. RUE slightly limited shoulder  flexion and elbow extension against gravity from stroke. Elbow distally WFL's  UPPER EXTREMITY MMT:   Rt shoulder 3/5, Lt shoulder 4/5   HAND FUNCTION: Grip strength: Right: 34.6 lbs (declined from 77 lbs at last admission); Left: 74.2 lbs  COORDINATION: 9 Hole Peg test: Right: 30 sec; Left: 24 sec  SENSATION: Light touch: WFL Pt reports RUE feels cold in the morning compared to LUE  EDEMA: none   COGNITION: Overall cognitive status:  pt inconsistent with reports of function/ability, pain and poor health advocate for himself  VISION: Subjective report: vision is ok Baseline vision: Bifocals and Wears glasses all the time Visual history:  N/A   PERCEPTION: Not tested  PRAXIS: Not tested  OBSERVATIONS: Pt with significant decline in grip strength Rt hand since last admission. Pt is the same with coordination Rt hand. Pt with fluctuating balance.   TODAY'S TREATMENT:                                                                                                                               TherEx: Flex bar (tan) supination, pronation, twist both directions - 2 min. each exercise - to increase BUE strengthening, to increase grip strength, to improve coordination and proprioception - Fading v/c to release flex bar and control movement ("slow and controlled"). Pt denied pain throughout task.  "Big" shoulder ABD/ADD and Flexion/ext between flex bar sets - to improve amplitude of movements, to improve BUE AROM with focus on "big" movements - v/c to straighten elbows and extend digits.  TherAct: Flipping cards then moving cards in "big" arcs - 10 cards each hand - to improve BUE amplitude, to increase BUE coordination and BUE proprioception - Increased time for processing motor movements. Fading v/c for "big" movements. Pt demo'd increased amplitude by end of task.  Handwriting - 3-lined paper with green visual cues on top and bottom lines - to increase amplitude for handwriting, to  decrease instances of micrographia, to improve FM coordination of RUE. Pt demo'd no instances of micrographia, demo'd 100% legibility, and maintained consistent sizing of handwriting with visual cues and 3-lined paper. Pt reports handwriting at home "starts off good but then gets worse." OT educated pt to use 3-lined paper with visual cues (highlighting top line and baseline) to improve amplitude of handwriting. Pt verbalized understanding and demo'd understanding by highlighting top and baselines on 3-lined paper.  PATIENT EDUCATION: Education details: see today's treatment above, upright seated posture, slow and controlled movements, big movements Person educated: Patient Education method: Explanation, Demonstration, Verbal cues, and Handouts Education comprehension: verbalized understanding, returned demonstration, verbal cues required, tactile  cues required, and needs further education  HOME EXERCISE PROGRAM: 10/31: handwriting strategies 05/21/23 - Bag exercises (handout, see pt instructions). Red Theraband Exercises for BUE - Seated. Access Code: [email or web address] (handwritten adjustments to pt's handouts to complete all exercises seated, see pt instructions.) 05/23/23 - cape method to don/doff jacket, lumbar rotation bilateral (supine), Overhead arm extension (supine), Wand ABD, Cane shoulder EXT (handouts provided, see pt instructions  GOALS: Goals reviewed with patient? Yes   LONG TERM GOALS: Target date: 06/23/23 (extended date d/t scheduling conflicts)   Independent with HEP for RUE shoulder strength and grip strength Baseline:  Goal status: INITIAL  2.  Rt grip strength to improve by 10 lbs or greater Baseline: 34 lbs Goal status: INITIAL  3.  Pt to be issued strategies for writing bigger Baseline:  Goal status: INITIAL  ASSESSMENT:  CLINICAL IMPRESSION: Pt tolerated tasks well and continues to benefit from v/c for "big movements." Pt demo'd greatly improved attention to sizing  of handwriting today using 3-lined paper with visual cues. Pt would benefit from PWR! Hands given hand changes and affects on functional use. Pt would benefit from skilled OT services to address deficits, increase overall independence, and return patient to PLOF as able.    PERFORMANCE DEFICITS: in functional skills including IADLs, coordination, ROM, strength, pain, Fine motor control, mobility, balance, and UE functional use, cognitive skills including memory and safety awareness.   IMPAIRMENTS: are limiting patient from IADLs, leisure, and social participation.   CO-MORBIDITIES: has co-morbidities such as cancer  that affects occupational performance. Patient will benefit from skilled OT to address above impairments and improve overall function.  REHAB POTENTIAL: Good  PLAN:  OT FREQUENCY: 2x/week  OT DURATION: 4 weeks  PLANNED INTERVENTIONS: 97535 self care/ADL training, 54098 therapeutic exercise, 97530 therapeutic activity, 97112 neuromuscular re-education, 97140 manual therapy, passive range of motion, functional mobility training, energy conservation, patient/family education, and DME and/or AE instructions  RECOMMENDED OTHER SERVICES: none at this time  CONSULTED AND AGREED WITH PLAN OF CARE: Patient  PLAN FOR NEXT SESSION:  Large amplitude coordination tasks PWR hands?  Flex bar Continue to review previously issued HEPs from earlier admission and review and/or update prn  Practice handwriting using visual cues on top and bottom baseline of 3-lined paper   Wynetta Emery, OT 05/28/2023, 2:29 PM

## 2023-05-30 ENCOUNTER — Ambulatory Visit: Payer: Medicare Other | Admitting: Occupational Therapy

## 2023-06-03 ENCOUNTER — Encounter: Payer: Medicare Other | Admitting: Occupational Therapy

## 2023-06-04 ENCOUNTER — Ambulatory Visit: Payer: Medicare Other | Admitting: Occupational Therapy

## 2023-06-04 DIAGNOSIS — M6281 Muscle weakness (generalized): Secondary | ICD-10-CM

## 2023-06-04 DIAGNOSIS — R208 Other disturbances of skin sensation: Secondary | ICD-10-CM

## 2023-06-04 DIAGNOSIS — R278 Other lack of coordination: Secondary | ICD-10-CM

## 2023-06-04 DIAGNOSIS — I69351 Hemiplegia and hemiparesis following cerebral infarction affecting right dominant side: Secondary | ICD-10-CM

## 2023-06-04 NOTE — Patient Instructions (Signed)
You can place your arms out to the sides with pillows underneath or place a pillow across your stomach with your hands resting on top for support. Hold a pillow with your arms away from your body.  Do not bend your arms at the elbows or tuck your hands under your head. Do not place your hand on top or underneath pillow. Use outside of pillow as a border.

## 2023-06-04 NOTE — Therapy (Signed)
OUTPATIENT OCCUPATIONAL THERAPY NEURO TREATMENT  Patient Name: Jerome Cardenas MRN: [id number] DOB:11-17-1950, 72 y.o., male Today's Date: 06/04/2023  PCP: Kerin Salen, PA-C REFERRING PROVIDER: Butch Penny, NP  END OF SESSION:  OT End of Session - 06/04/23 1107     Visit Number 6    Number of Visits 8    Date for OT Re-Evaluation 06/23/23    Authorization Type MCR, [employer]    Progress Note Due on Visit 10    OT Start Time 1105    OT Stop Time 1145    OT Time Calculation (min) 40 min    Activity Tolerance Patient tolerated treatment well    Behavior During Therapy [facility] for tasks assessed/performed                Past Medical History:  Diagnosis Date   Cancer (HCC)    prostate   CKD (chronic kidney disease) stage 2, GFR 60-89 ml/min 07/28/2022   HLD (hyperlipidemia)    Hypertension    Past Surgical History:  Procedure Laterality Date   APPENDECTOMY     CHOLECYSTECTOMY     HERNIA REPAIR     PROSTATE SURGERY     prostate cancer 2014   Patient Active Problem List   Diagnosis Date Noted   Diabetes mellitus type 2 with neurological manifestations (HCC) 07/29/2022   Hypertension associated with diabetes (HCC) 07/29/2022   Hyperlipidemia associated with type 2 diabetes mellitus (HCC) 07/29/2022   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    ONSET DATE: 04/08/2023 (referral date)   REFERRING DIAG: M62.81 (ICD-10-CM) - Muscle weakness of right upper extremity  Note:  Right upper extremity weakness hand weakness  THERAPY DIAG:  Muscle weakness (generalized)  Other lack of coordination  Other disturbances of skin sensation  Hemiplegia and hemiparesis following cerebral infarction affecting right dominant side (HCC)  Rationale for Evaluation and Treatment: Rehabilitation  SUBJECTIVE:   SUBJECTIVE STATEMENT: Pt reports he has various tumors on the right side of his body near his shoulder.   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 PMH: Lt thalamic stroke in Jan 2024, hypertension, hyperlipidemia, chronic kidney disease, prostate cancer   PRECAUTIONS: Fall, active CA  WEIGHT BEARING RESTRICTIONS: No  PAIN:  Are you having pain? 3/10 pain in RUE   FALLS: Has patient fallen in last 6 months? Yes. Number of falls 1  LIVING ENVIRONMENT: Lives with: lives with their spouse Lives in:2 STORY HOUSE, bedroom on 2nd floor, 14 steps total to bedroom (7 to platform, 7 more to 2nd floor) Has following equipment at home: Quad cane small base, shower chair, and rollator  PLOF: Independent and retired  PATIENT GOALS: get my Rt hand better  OBJECTIVE:  Note: Objective measures were completed at Evaluation unless otherwise noted.  HAND DOMINANCE: Right  ADLs: Eating: independent  Grooming: independent UB Dressing: independent  LB Dressing: independent Toileting: independent Bathing: independent Tub Shower transfers: independent  Equipment: Grab bars  IADLs: Shopping: wife performs Light housekeeping: wife performs Meal Prep: wife performs Community mobility: pt driving Medication management: independent per pt report Landscape architect: wife performs Handwriting: 90% legible in print for name and short sentence, pt reports he has always written smaller but at last admission he said his handwriting had gotten smaller  MOBILITY STATUS:  use rollator in community, uses quad cane in the house  UPPER EXTREMITY ROM:  LUE AROM WNL's but still required cues for full sh flexion. RUE  slightly limited shoulder flexion and elbow extension against gravity from stroke. Elbow distally WFL's  UPPER EXTREMITY MMT:   Rt shoulder 3/5, Lt shoulder 4/5   HAND FUNCTION: Grip strength: Right: 34.6 lbs (declined from 77 lbs at last admission); Left: 74.2 lbs  COORDINATION: 9 Hole Peg test: Right: 30 sec; Left: 24 sec  SENSATION: Light touch: WFL Pt reports RUE  feels cold in the morning compared to LUE  EDEMA: none   COGNITION: Overall cognitive status:  pt inconsistent with reports of function/ability, pain and poor health advocate for himself  VISION: Subjective report: vision is ok Baseline vision: Bifocals and Wears glasses all the time Visual history:  N/A   PERCEPTION: Not tested  PRAXIS: Not tested  OBSERVATIONS: Pt with significant decline in grip strength Rt hand since last admission. Pt is the same with coordination Rt hand. Pt with fluctuating balance.   TODAY'S TREATMENT:                                                                                                                               TherEx: R gross grasp to pick up colored blocks and place into bowl with 35 pound hand grip for strengthening of affected extremity  - Self-care/home management completed for duration as noted below including: OT educated patient on sleep positioning as noted in patient instructions to reduce stress to upper extremity nerves, which could be attributing to reported paresthesias and pain in affected extremity. Patient verbalized understanding. Handout provided. Therapist reviewed goals with patient and updated patient progression.  Objective measures assessed as noted in Goals section to determine progression towards goals. OT educated pt on use of heat and stretching of RUE in efforts to improve pain.   PATIENT EDUCATION: Education details: see today's treatment above Person educated: Patient Education method: Explanation, Demonstration, Verbal cues, and Handouts Education comprehension: verbalized understanding, returned demonstration, verbal cues required, tactile cues required, and needs further education  HOME EXERCISE PROGRAM: 10/31: handwriting strategies 05/21/23 - Bag exercises (handout, see pt instructions). Red Theraband Exercises for BUE - Seated. Access Code: [email or web address] (handwritten adjustments to pt's handouts to complete  all exercises seated, see pt instructions.) 05/23/23 - cape method to don/doff jacket, lumbar rotation bilateral (supine), Overhead arm extension (supine), Wand ABD, Cane shoulder EXT (handouts provided, see pt instructions  GOALS: Goals reviewed with patient? Yes   LONG TERM GOALS: Target date: 06/23/23 (extended date d/t scheduling conflicts)   Independent with HEP for RUE shoulder strength and grip strength Baseline:  Goal status: IN PROGRESS  2.  Rt grip strength to improve by 10 lbs or greater Baseline: 34 lbs 06/04/2023: 34.6 lbs Goal status: IN PROGRESS  3.  Pt to be issued strategies for writing bigger Baseline:  Goal status: MET  ASSESSMENT:  CLINICAL IMPRESSION: Pt reports tumors on right side of body near shoulder, but unable to fully determine the location of reported tumors. Would recommend use of heat for R  shoulder pain management; however, pt cautioned this could be contraindicated depending on tumor location. He was advised to ask treating physician if he can use a heating pad over this area. Otherwise, pt was encouraged to stretch RUE in shower with warm water by sliding arm up the wall of the shower/moving it. Pt reported symptoms are indicative of some arthritic changes to this joint and therefore should respond well to heat and stretching.   PERFORMANCE DEFICITS: in functional skills including IADLs, coordination, ROM, strength, pain, Fine motor control, mobility, balance, and UE functional use, cognitive skills including memory and safety awareness.   IMPAIRMENTS: are limiting patient from IADLs, leisure, and social participation.   CO-MORBIDITIES: has co-morbidities such as cancer  that affects occupational performance. Patient will benefit from skilled OT to address above impairments and improve overall function.  REHAB POTENTIAL: Good  PLAN:  OT FREQUENCY: 2x/week  OT DURATION: 4 weeks  PLANNED INTERVENTIONS: 97535 self care/ADL training, 40981  therapeutic exercise, 97530 therapeutic activity, 97112 neuromuscular re-education, 97140 manual therapy, passive range of motion, functional mobility training, energy conservation, patient/family education, and DME and/or AE instructions  RECOMMENDED OTHER SERVICES: none at this time  CONSULTED AND AGREED WITH PLAN OF CARE: Patient  PLAN FOR NEXT SESSION: d/c Recheck grip strength (3 rapid)  Continue to review previously issued HEPs from earlier admission and review and/or update prn    Delana Meyer, OT 06/04/2023, 5:28 PM

## 2023-06-05 ENCOUNTER — Ambulatory Visit: Payer: Medicare Other | Admitting: Occupational Therapy

## 2023-06-05 DIAGNOSIS — M6281 Muscle weakness (generalized): Secondary | ICD-10-CM | POA: Diagnosis not present

## 2023-06-05 DIAGNOSIS — R278 Other lack of coordination: Secondary | ICD-10-CM

## 2023-06-05 DIAGNOSIS — R208 Other disturbances of skin sensation: Secondary | ICD-10-CM

## 2023-06-05 NOTE — Therapy (Signed)
OUTPATIENT OCCUPATIONAL THERAPY NEURO TREATMENT AND DISCHARGE  Patient Name: Jerome Cardenas MRN: [id number] DOB:11-Jul-1950, 72 y.o., male Today's Date: 06/05/2023  OCCUPATIONAL THERAPY DISCHARGE SUMMARY  Visits from Start of Care: 7  Current functional level related to goals / functional outcomes: Patient has met all goals to date.   Remaining deficits: Chronic R shoulder pain   Education / Equipment: Continue with HEP and strategies following OT d/c to maintain current level of function.    Patient agrees to discharge. Patient goals were met. Patient is being discharged due to meeting the stated rehab goals.Marland Kitchen  PCP: Kerin Salen, PA-C REFERRING PROVIDER: Butch Penny, NP  END OF SESSION:  OT End of Session - 06/05/23 1407     Visit Number 7    Number of Visits 8    Date for OT Re-Evaluation 06/23/23    Authorization Type MCR, [employer]    Progress Note Due on Visit 10    OT Start Time 1407    OT Stop Time 1438    OT Time Calculation (min) 31 min    Activity Tolerance Patient tolerated treatment well    Behavior During Therapy WFL for tasks assessed/performed             Past Medical History:  Diagnosis Date   Cancer (HCC)    prostate   CKD (chronic kidney disease) stage 2, GFR 60-89 ml/min 07/28/2022   HLD (hyperlipidemia)    Hypertension    Past Surgical History:  Procedure Laterality Date   APPENDECTOMY     CHOLECYSTECTOMY     HERNIA REPAIR     PROSTATE SURGERY     prostate cancer 2014   Patient Active Problem List   Diagnosis Date Noted   Diabetes mellitus type 2 with neurological manifestations (HCC) 07/29/2022   Hypertension associated with diabetes (HCC) 07/29/2022   Hyperlipidemia associated with type 2 diabetes mellitus (HCC) 07/29/2022   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    ONSET DATE: 04/08/2023 (referral date)   REFERRING DIAG: M62.81 (ICD-10-CM) - Muscle weakness of right upper extremity  Note:  Right upper extremity  weakness hand weakness  THERAPY DIAG:  Muscle weakness (generalized)  Other lack of coordination  Other disturbances of skin sensation  Rationale for Evaluation and Treatment: Rehabilitation  SUBJECTIVE:   SUBJECTIVE STATEMENT: Pt reports he has copies of all his exercises and feels comfortable completing these exercises following OT d/c.  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 PMH: Lt thalamic stroke in Jan 2024, hypertension, hyperlipidemia, chronic kidney disease, prostate cancer   PRECAUTIONS: Fall, active CA  WEIGHT BEARING RESTRICTIONS: No  PAIN:  Are you having pain? 3/10 pain in RUE   FALLS: Has patient fallen in last 6 months? Yes. Number of falls 1  LIVING ENVIRONMENT: Lives with: lives with their spouse Lives in:2 STORY HOUSE, bedroom on 2nd floor, 14 steps total to bedroom (7 to platform, 7 more to 2nd floor) Has following equipment at home: Quad cane small base, shower chair, and rollator  PLOF: Independent and retired  PATIENT GOALS: get my Rt hand better  OBJECTIVE:  Note: Objective measures were completed at Evaluation unless otherwise noted.  HAND DOMINANCE: Right  ADLs: Eating: independent  Grooming: independent UB Dressing: independent  LB Dressing: independent Toileting: independent Bathing: independent Tub Shower transfers: independent  Equipment: Grab bars  IADLs: Shopping: wife performs Light housekeeping: wife performs Meal Prep: wife performs Community mobility: pt driving Medication management: independent  per pt report Financial management: wife performs Handwriting: 90% legible in print for name and short sentence, pt reports he has always written smaller but at last admission he said his handwriting had gotten smaller  MOBILITY STATUS:  use rollator in community, uses quad cane in the house  UPPER EXTREMITY ROM:  LUE AROM WNL's but still required  cues for full sh flexion. RUE slightly limited shoulder flexion and elbow extension against gravity from stroke. Elbow distally WFL's  UPPER EXTREMITY MMT:   Rt shoulder 3/5, Lt shoulder 4/5   HAND FUNCTION: Grip strength: Right: 34.6 lbs (declined from 77 lbs at last admission); Left: 74.2 lbs  COORDINATION: 9 Hole Peg test: Right: 30 sec; Left: 24 sec  SENSATION: Light touch: WFL Pt reports RUE feels cold in the morning compared to LUE  EDEMA: none   COGNITION: Overall cognitive status:  pt inconsistent with reports of function/ability, pain and poor health advocate for himself  VISION: Subjective report: vision is ok Baseline vision: Bifocals and Wears glasses all the time Visual history:  N/A   PERCEPTION: Not tested  PRAXIS: Not tested  OBSERVATIONS: Pt with significant decline in grip strength Rt hand since last admission. Pt is the same with coordination Rt hand. Pt with fluctuating balance.   TODAY'S TREATMENT:                                                                                                                               Therapist reviewed goals with patient and updated patient progression.  No additional functional limitations identified. Objective measures assessed as noted in Goals section to determine progression towards goals. OT initiated Exercise chart as noted in pt instructions to help with carryover of HEP.  PATIENT EDUCATION: Education details: see today's treatment above; OT d/c Person educated: Patient Education method: Explanation, Demonstration, Verbal cues, and Handouts Education comprehension: verbalized understanding, returned demonstration, and verbal cues required  HOME EXERCISE PROGRAM: 10/31: handwriting strategies 05/21/23 - Bag exercises (handout, see pt instructions). Red Theraband Exercises for BUE - Seated. Access Code: [email or web address] (handwritten adjustments to pt's handouts to complete all exercises seated, see pt  instructions.) 05/23/23 - cape method to don/doff jacket, lumbar rotation bilateral (supine), Overhead arm extension (supine), Wand ABD, Cane shoulder EXT (handouts provided, see pt instructions 06/05/2023: Exercise chart  GOALS: Goals reviewed with patient? Yes  LONG TERM GOALS: Target date: 06/23/23 (extended date d/t scheduling conflicts)   Independent with HEP for RUE shoulder strength and grip strength Baseline:  Goal status: MET  2.  Rt grip strength to improve by 10 lbs or greater Baseline: 34 lbs 06/04/2023: 34.6 lbs 06/05/2023: Right: 50.9; 45.4; 51.1:  49 lbs Goal status: MET  3.  Pt to be issued strategies for writing bigger Baseline:  Goal status: MET  ASSESSMENT:  CLINICAL IMPRESSION: Patient is appropriate for discharge and no longer demonstrates medical necessity for continued skilled occupational services.  PERFORMANCE DEFICITS: in functional skills including  IADLs, coordination, ROM, strength, pain, Fine motor control, mobility, balance, and UE functional use, cognitive skills including memory and safety awareness.   IMPAIRMENTS: are limiting patient from IADLs, leisure, and social participation.   CO-MORBIDITIES: has co-morbidities such as cancer  that affects occupational performance. Patient will benefit from skilled OT to address above impairments and improve overall function.  REHAB POTENTIAL: Good  PLAN:  OT D/C Completed   Delana Meyer, OT 06/05/2023, 4:38 PM

## 2023-06-05 NOTE — Patient Instructions (Addendum)
(  Exercise) Monday Tuesday Wednesday Thursday Friday Saturday Sunday   Wall Slides (shower)           Band           Stick           [employer]           Coordination           Putty           [employer] Method for [employer] strategies

## 2023-08-16 NOTE — Progress Notes (Signed)
 Hematology/Oncology Follow-up Visit  Patient Name:  Jerome Cardenas Date of Birth:  1951-01-04 Date of Encounter:  08/16/2023  Referring Provider:  No ref. provider found,   PCP:  Tinnie Almarie Forts, PA-C  Diagnoses: 1.  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   Treatment: 1.  Leuprolide every 3 months initiated 04/27/2021.  (Urology, Dr. Gwenith)   2.  Enzalutamide  160 mg p.o. daily, initiated 06/02/2021.  Held January 2023 secondary to anorexia, fatigue and dizziness.  Reinitiated at 80 mg daily 07/26/2021.  Titrated to 160 mg daily February 2023 with excellent tolerance.     Hematologic/Oncologic History: Jerome Cardenas is a 73 y.o. male who is seen in consultation at the request of Forts Tinnie Brier* for an evaluation of biochemical recurrence of castration sensitive prostate cancer, original date of consultation 05/03/2021.   [name] has a past medical history significant for hypertension and prediabetes.  He was diagnosed with cT1 N0 M0 Gleason 6 prostate cancer 11/07/2012.  Tumor was in the left apex and left lateral apex.  PTEN deletion was not observed in the sample.   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  He was treated with prostate brachytherapy under the care of Dr. Norleen Prude at the [facility] prostate center.   He has been followed long-term by Dr. Gwenith in urology.  PSA on 02/26/2020 was <0.1.  On 05/20/2020, PSA was detectable at 0.13.  Short-term follow-up PSA on 08/26/2020 had risen to 0.33.  Unfortunately, there continues to be an unfavorable trend in  the PSA in May when it was found to be 0.88.  Follow-up PSA on 03/02/2021 had risen to 4.51.   Nuclear medicine bone scan on 03/24/2021 revealed a single focus of intense uptake of radiotracer within the left iliac spine.  Focal osseous metastasis was not excluded.  MRI of the pelvis revealed adenopathy along the left external iliac  chain, 13 mm  lymph node in this area, small rounded abnormal appearing lymph nodes adjacent measuring 6 mm, suspicious for metastasis based on location and appearance.  There was an enhancing lesion in the left posterior iliac bone suspicious for bony metastasis.   Signs of sclerosis with sclerotic areas in the left and right iliac bones were also noted, concerning for metastasis.   Mr. Brouwer received his first dose of leuprolide on 04/27/2021 at Dr. Puschinsky's office.  Interim Note:  [name] returns today for follow up of high risk castration sensitive adenocarcinoma the prostate.  This is a 4-week scheduled follow-up.  He really does not feel any different off the abiraterone.  He still does not have much of an appetite and he is still having at least 3 or 4 loose stools a day.  He saw his primary care provider who discontinued his trazodone and sent in a prescription for mirtazapine.  He has also been referred to psychiatry and is being set up with GI as well to evaluate the diarrhea.  ECOG performance status 3.  Review of Systems: All other systems are negative.  Medications:   Current Outpatient Medications:  .  amLODIPine  (NORVASC ) 10 mg tablet, Take 1 tablet (10 mg total) by mouth daily., Disp: 90 tablet, Rfl: 3 .  apple cider vinegar 500 mg tab, Take 1 tablet by mouth daily., Disp: , Rfl:  .  aspirin  81 mg EC tablet, Take 81 mg by mouth Once Daily., Disp: , Rfl:  .  baclofen  (LIORESAL ) 5 mg tablet, Take  5 mg by mouth 4 (four) times a day., Disp: , Rfl:  .  calcium  carbonate (OS-CAL) 1500 mg (600 mg calcium ) tablet, Take 1 tablet by mouth daily., Disp: , Rfl:  .  cholecalciferol  (VITAMIN D3) 2,000 unit tablet, Take 2,000 Units by mouth Once Daily., Disp: 30 each, Rfl: 5 .  cyanocobalamin  (VITAMIN B12) 2,500 mcg tab, Take 1,000 mg by mouth daily., Disp: , Rfl:  .  enzalutamide  (Xtandi ) 40 mg capsule, TAKE 4 CAPSULES (160 MG) DAILY, Disp: 120 capsule, Rfl: 11 .   finasteride (PROSCAR) 5 mg tablet, Take 5 mg by mouth Once Daily., Disp: , Rfl:  .  gabapentin (NEURONTIN) 100 mg capsule, Take 1 to 3 capsules before bedtime as needed for insomnia and right sided pain., Disp: 90 capsule, Rfl: 3 .  glucose blood (FreeStyle Lite Strips) test strip, USE TO MONITOR BLOOD GLUCOSE 1 TO 2 TIMES DAILY (Patient not taking: Reported on 08/14/2023), Disp: 200 strip, Rfl: 3 .  icosapent ethyL (Vascepa) 1 gram cap, Take 2 g by mouth in the morning and 2 g in the evening. Take with meals., Disp: 360 capsule, Rfl: 5 .  Lancets misc, Monitor BG once to twice daily (Patient not taking: Reported on 08/14/2023), Disp: , Rfl:  .  magnesium oxide 400 mg (241 mg magnesium) tab, Take 400 mg by mouth., Disp: , Rfl:  .  menthol-colloidal oatmeal (Eucerin Calm Itch,menthol-oat,) 0.1 % lotn, Apply twice a day on body, Disp: 200 mL, Rfl: 5 .  metFORMIN  (GLUCOPHAGE ) 500 mg tablet, TAKE 1 TABLET ONCE DAILY AFTER LARGEST MEAL (Patient taking differently: Take 500 mg by mouth in the morning and 500 mg in the evening. Take with meals.), Disp: 30 tablet, Rfl: 0 .  metoprolol  succinate (TOPROL  XL) 50 mg 24 hr tablet, Take 1.5 tablets (75 mg total) by mouth daily., Disp: 135 tablet, Rfl: 2 .  mirtazapine (REMERON) 15 mg tablet, Take 1 tablet (15 mg total) by mouth at bedtime. Take in place of trazodone, Disp: 30 tablet, Rfl: 1 .  multivitamin-iron-minerals-folic acid (Thera-M) 27-0.4 mg tab, Take 1 tablet by mouth daily., Disp: , Rfl:  .  olmesartan (BENICAR) 40 mg tablet, Take 1 tablet (40 mg total) by mouth daily., Disp: 90 tablet, Rfl: 3 .  omeprazole (PriLOSEC) 20 mg DR capsule, Take 1 capsule (20 mg total) by mouth daily., Disp: 90 capsule, Rfl: 3 .  rosuvastatin  (CRESTOR ) 20 mg tablet, Take 1 tablet (20 mg total) by mouth daily., Disp: 90 tablet, Rfl: 2 .  spironolactone  (ALDACTONE ) 50 mg tablet, Take 1 tablet (50 mg total) by mouth daily., Disp: 90 tablet, Rfl: 4 .  tamsulosin  (FLOMAX ) 0.4 mg cap,  Take 0.4 mg by mouth Once Daily., Disp: , Rfl:  .  topiramate  (TOPAMAX ) 25 mg tablet, Take 25 mg by mouth daily., Disp: , Rfl:  .  traMADoL (ULTRAM) 50 mg tablet, Take 50 mg by mouth every 6 (six) hours as needed., Disp: 60 tablet, Rfl: 1 .  triamcinolone (KENALOG) 0.1 % cream, Apply twice a day on affected areas, Disp: 80 g, Rfl: 1 .  vit C-zinc  citrate-elderberry (Elderberry Immune Health) 45-3.75-50 mg chew, TAKE 1 TABLET A DAY, Disp: , Rfl:   Past medical history, allergies, current medications, social history and family history were reviewed and updated when appropriate.  Objective:  BP 143/84   Pulse 60   Temp 98.4 F (36.9 C) (Oral)   Resp 17   Ht 1.778 m (5' 10)   Wt 93.9 kg (  207 lb)   SpO2 97%   BMI 29.70 kg/m  General: He does not appear toxic today.  No acute distress.  He has a very flat affect.  I was able to make him smile, though.  He provided a good history of present illness but it did take me asking very pointed specific questions to get answers. HEENT: Normocephalic, atraumatic. PERRLA, sclera anicteric.  Nose without discharge. Lymphatic/Immunologic:  No cervical, supraclavicular or axillary adenopathy. Respiratory:  Chest clear to auscultation bilaterally. No respiratory distress. The patient speaks in full sentences. No accessory muscle use noted. Musculoskeletal:  No bony pain or tenderness. No tenosynovitis or joint effusions noted. Extremities:  No edema or suspicious rashes.  No cyanosis or clubbing.  Wt Readings from Last 5 Encounters:  08/16/23 93.9 kg (207 lb)  08/14/23 94.7 kg (208 lb 12.8 oz)  07/15/23 94.6 kg (208 lb 9.6 oz)  06/24/23 95.3 kg (210 lb)  04/15/23 99.2 kg (218 lb 12.8 oz)    Results:   Results for orders placed or performed in visit on 07/15/23  Comprehensive Metabolic Panel   Collection Time: 07/15/23 10:40 AM  Result Value Ref Range   Sodium 140 136 - 145 mmol/L   Potassium 3.8 3.4 - 4.5 mmol/L   Chloride 105 98 - 107 mmol/L    CO2 26 21 - 31 mmol/L   Anion Gap 9 6 - 14 mmol/L   Glucose, Random 162 (H) 70 - 99 mg/dL   Blood Urea Nitrogen (BUN) 17 7 - 25 mg/dL   Creatinine 8.89 9.29 - 1.30 mg/dL   eGFR 71 >40 fO/fpw/8.26f7   Albumin 4.7 3.5 - 5.7 g/dL   Total Protein 7.2 6.4 - 8.9 g/dL   Bilirubin, Total 0.9 0.3 - 1.0 mg/dL   Alkaline Phosphatase (ALP) 82 34 - 104 U/L   Aspartate Aminotransferase (AST) 11 (L) 13 - 39 U/L   Alanine Aminotransferase (ALT) 8 7 - 52 U/L   Calcium  10.1 8.6 - 10.3 mg/dL   BUN/Creatinine Ratio    PSA, Total   Collection Time: 07/15/23 10:40 AM  Result Value Ref Range   PSA, Total 0.00 0.00 - 6.50 ng/mL  CBC with Differential   Collection Time: 07/15/23 10:40 AM  Result Value Ref Range   WBC 8.20 4.40 - 11.00 10*3/uL   RBC 4.85 4.50 - 5.90 10*6/uL   Hemoglobin 15.2 14.0 - 17.5 g/dL   Hematocrit 57.3 58.4 - 50.4 %   Mean Corpuscular Volume (MCV) 87.7 80.0 - 96.0 fL   Mean Corpuscular Hemoglobin (MCH) 31.4 27.5 - 33.2 pg   Mean Corpuscular Hemoglobin Conc (MCHC) 35.8 33.0 - 37.0 g/dL   Red Cell Distribution Width (RDW) 13.9 12.3 - 17.0 %   Platelet Count (PLT) 191 150 - 450 10*3/uL   Mean Platelet Volume (MPV) 7.8 6.8 - 10.2 fL   Neutrophils % 61 %   Lymphocytes % 27 %   Monocytes % 8 %   Eosinophils % 4 %   Basophils % 1 %   Neutrophils Absolute 5.00 1.80 - 7.80 10*3/uL   Lymphocytes # 2.20 1.00 - 4.80 10*3/uL   Monocytes # 0.70 0.00 - 0.80 10*3/uL   Eosinophils # 0.30 0.00 - 0.50 10*3/uL   Basophils # 0.10 0.00 - 0.20 10*3/uL   Lab Results  Component Value Date   PSA 0.00 07/15/2023   PSA 0.00 04/15/2023   PSA 0.00 01/28/2023    Radiology:   Endo Colon Table formatting from the original result was  not included. Staff Role  Clem JINNY Sero, MD Proceduralist  Rocky Macario Louder, CRNA CRNA  Rolin Anette Faden, RN Endo Nurse  Norleen Dallas Hock, MD Anesthesiologist  Melene Garre [facility] Endo Technician   Impression 2 polyps One 12 mm polyp in the  proximal ascending colon; performed hot snare  removal One 10 mm polyp in the ascending colon; performed hot snare removal Performed pancolonic forceps biopsies to rule out microscopic colitis The terminal ileum appeared normal. (grade 1) hemorrhoids  Recommendation Await pathology results  Repeat colonoscopy in 3 years, due: 03/12/2026      Indication Colon cancer screening  Medications See Anesthesia Record.  Preprocedure A history and physical has been performed, and patient medication  allergies have been reviewed. The patient's tolerance of previous  anesthesia has been reviewed. The risks and benefits of the procedure and  the sedation options and risks were discussed with the patient. All  questions were answered and informed consent obtained.  Details of the Procedure The patient underwent monitored anesthesia care, which was administered by  an anesthesia professional. The patient's blood pressure, ECG, ETCO2,  heart rate, level of consciousness, oxygen and respirations were monitored  throughout the procedure. A digital rectal exam was performed. A perianal  exam was performed. The scope was introduced through the anus and advanced  to the terminal ileum, 10 cm from the ileocecal valve. Retroflexion was  performed in the rectum. The quality of bowel preparation was evaluated  using the [facility] Bowel Preparation Scale with scores of: right colon = 2,  transverse colon = 2, left colon = 2. The total BBPS score was 6. Bowel  prep was adequate. The patient experienced no blood loss. The procedure  was not difficult. The patient tolerated the procedure well. There were no  apparent adverse events.   Events Procedure Events  Event Event Time  LOWER SCOPE IN TIME 03/13/2023 10:59 AM  ENDO CECUM REACHED 03/13/2023 11:03 AM  LOWER SCOPE OUT TIME 03/13/2023 11:24 AM   Findings Polyp One 12 mm pedunculated polyp in the proximal ascending colon; no bleeding  was identified;  performed hot snare with complete en bloc removal and  retrieved specimen One 10 mm pedunculated polyp in the ascending colon; no bleeding was  identified; performed hot snare with complete en bloc removal and  retrieved specimen One 2 mm sessile polyp in the distal ascending colon; no bleeding was  identified; performed cold forceps biopsy with complete en bloc removal.  The colon was otherwise normal. Performed 16 random pancolonic forceps biopsies to rule out microscopic  colitis The terminal ileum appeared normal. Internal (grade 1) hemorrhoids; no bleeding was identified  Post Procedure Diagnosis  No diagnosis.   Specimens ID Type Source Tests Collected by Time  1 : random colon bx r/o microscopic colitis Tissue Colon, Random TISSUE  EXAM Clem JINNY Sero, MD 03/13/2023 1107  2 : Ascending colon polyp x3 r/o adenoma Tissue Colon Polyp,  Right/Ascending TISSUE EXAM Clem JINNY Sero, MD 03/13/2023 [id number]   Referring Provider Sero Clem JINNY, MD  Last Colonoscopy 09/11/2019    Assessment and Plan:  1.  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  I wonder as well how much symptoms might be due to androgen deprivation therapy.  We know that leuprolide causes metabolic changes that increase the risk of both arterial and venous  thromboembolic events.  The mechanism is felt to be related to decrease in lean muscle mass, increased visceral fat, insulin  resistance and dyslipidemia, all leading to a prothrombotic state that expedites atherosclerosis.  There are also felt to be immunomodulatory changes such as activation of atherosclerotic plaque T-cell GnRH receptors leading to plaque destabilization.  Admittedly, there is still uncertainty as to whether leuprolide might have a direct effect on increased risk and whether a GnRH  antagonist might be safer.  I told him I would recommend strong consideration for transitioning from leuprolide to degarelix.  I told him I would talk to Dr. Gwenith about this.  According to Mr. Brownstein, he just received his injection.  He would like to try going back on his abiraterone.  I do not see a contraindication.  Abiraterone in and of itself does not increase the risk of stroke.  I told him that if he felt worse in any way going back on the abiraterone to let me know and we would reduce the dose.  I did my best offer support today.  Questions were answered today to the best of my ability.  I will see him back in 8 weeks with labs, sooner if necessary.  In total, 30 minutes of time was spent on this encounter, greater than 50% of which was spent face-to-face with Nancyann Lemond Drum.  Non-face-to-face activities may include but are not necessarily limited to reviewing medical records, entering orders, reviewing and signing treatment/chemotherapy orders, reviewing and interpreting imaging studies and labs, as well as medical documentation.  This document was created using the aid of voice recognition Dragon dictation software, please excuse any sound alike substitutions, typographic or transcription errors.  Efforts have been made to correct these dictation errors, however some may persist, and this does not reflect the standard of medical care.  If there are any questions please do not hesitate to contact me for clarification.

## 2023-09-19 ENCOUNTER — Other Ambulatory Visit: Payer: Self-pay | Admitting: Internal Medicine

## 2023-09-19 DIAGNOSIS — K529 Noninfective gastroenteritis and colitis, unspecified: Secondary | ICD-10-CM

## 2023-09-19 DIAGNOSIS — R634 Abnormal weight loss: Secondary | ICD-10-CM

## 2023-10-15 ENCOUNTER — Ambulatory Visit
Admission: RE | Admit: 2023-10-15 | Discharge: 2023-10-15 | Disposition: A | Discharge status: Home / Self Care | Source: Ambulatory Visit | Attending: Internal Medicine

## 2023-10-15 DIAGNOSIS — R634 Abnormal weight loss: Secondary | ICD-10-CM

## 2023-10-15 DIAGNOSIS — K529 Noninfective gastroenteritis and colitis, unspecified: Secondary | ICD-10-CM

## 2023-10-15 MED ORDER — IOPAMIDOL (ISOVUE-300) INJECTION 61%
100.0000 mL | Freq: Once | INTRAVENOUS | Status: AC | PRN
  Administered 2023-10-15: 100 mL via INTRAVENOUS

## 2023-11-26 NOTE — Progress Notes (Signed)
 PATIENT: Jerome Cardenas DOB: 24-Nov-1950  REASON FOR VISIT: follow up HISTORY FROM: patient PRIMARY NEUROLOGIST: Dr. Janett Medin  Chief Complaint  Patient presents with   Follow-up    Pt in room 19. Alone. Here for follow up history of Left thalamic infarction. Pt reports right arm and leg are still weak/numb,no headaches.  Pt had fall 1 week ago, no head injury.      HISTORY OF PRESENT ILLNESS: Today 11/26/23:  Jerome Cardenas is a 73 y.o. male with a history of left lateral thalamic infarct. Returns today for follow-up.  Overall he feels that he is doing relatively well.  He remains on Topamax  25 mg twice a day.  Denies any tingling sensation in the right upper extremity.  He does report that sometimes the right upper extremity feels cold.  Continues to have weakness in the right arm and right leg.  Reports that a week ago he was coming out of the bathroom and fell.  Reports that his legs got weak.  He did hit his head against the door.  No headache vision change or confusion since then.  His PCP recently put him on Cymbalta for depression.  He is not sure how helpful this has been yet.  He remains on aspirin  for stroke prevention.  Has regular follow-ups with his PCP to manage risk factors.   03/26/23: Jerome Cardenas is a 73 y.o. male with a history of left Lateral thalamic infarct. Returns today for follow-up.  At the last visit with Dr. Janett Medin he was started on Topamax  for paresthesias on the right side.  Reports that he continues to have pins and needle sensation on the right side of the body in the arm and the leg.  He also reports muscle spasms on the right side.  He was on baclofen  10 mg at bedtime but did not find it beneficial so it was not refilled.  He remains on aspirin  without significant bruising.  Blood pressure is slightly elevated today.  PCP is checking lipid panel.  Denies any stroke like symptoms.   01/30/23: Jerome Cardenas is a 73 year old Caucasian male seen today for  office follow-up visit following last office visit with Colby Daub nurse practitioner on 08/29/2022.  He has past medical history of hypertension, hyperlipidemia, chronic kidney disease, prostate cancer.  He presented on 07/28/2022 with 2-day history of right facial droop and right-sided weakness and numbness and hemiataxia.  CT head showed no acute finding and MRI scan showed left lateral thalamic/posterior limb internal capsule infarct.  CT angiogram showed left P3 occlusion.  2D echo showed ejection fraction of 60 to 65%.  LDL cholesterol 67 mg percent.  Triglycerides were elevated at 371.  Hemoglobin A1c was 6.6.  He was initially started on dual antiplatelet therapy aspirin  and Plavix  for 3 months and then aspirin  alone.  Patient states he has noticed improvement in right-sided strength but he still has significant right-sided paresthesias.  These have been bothersome and are not getting better.  He has not tried any medications for his paresthesias.  He also complains of occasional leg spasms at night.  He has finished outpatient physical and Occupational Therapy.  He is able to ambulate with a cane.  He does have a wheeled walker which she uses rarely.  He has had no falls or injuries.  Still has some diminished fine motor skills on the right side.  He has had no recurrent stroke or TIA symptoms.  He is tolerating aspirin   well without bruising or bleeding.  He is tolerating Crestor  well without muscle aches and pains.  He has an upcoming visit with primary care physician and will have follow-up lipid profile checked.  He is on Xtandi  for his prostate cancer.   08/29/22: Jerome Cardenas is a 73 y.o. 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 Returns today for follow-up.  Patient reports that he continues to have trouble with his gait.  Uses a cane when ambulating.  No falls. No PT or OT. Reports that he has spasms in the right arm and leg usually daily.  Only last a few minutes. Typically happens at night. Only on ASA.  Took Plavix  for 1 month.  HISTORY Jerome Cardenas is a 73 y.o. male with history of HTN, HLD, borderline DM admitted for right sided weakness and numbness with right facial droop. No tPA given due to outside window.     Stroke:  left lateral thalamic infarct likely secondary to large vessel disease source CT no acute finding CTA head and neck showed left P3 occlusion  MRI  left lateral thalamic / PLIC infarc 2D Echo EF 60-65% LDL 67 TG 371 HgbA1c 6.6 Lovenox  for VTE prophylaxis No antithrombotic prior to admission, now on aspirin  81 mg daily and clopidogrel  75 mg daily DAPT for 3 months and then ASA alone given left distal PCA occlusion. Patient counseled to be compliant with his antithrombotic medications Ongoing aggressive stroke risk factor management Therapy recommendations:  HH PT Disposition:  pending    REVIEW OF SYSTEMS: Out of a complete 14 system review of symptoms, the patient complains only of the following symptoms, and all other reviewed systems are negative.  ALLERGIES: That red cell and they are now reaching a point okay you should hear something within the next week about simple therapy okay I will put it in today for care   HOME MEDICATIONS: Outpatient Medications Prior to Visit  Medication Sig Dispense Refill   amLODipine  (NORVASC ) 10 MG tablet Take 10 mg by mouth daily.     APPLE CIDER VINEGAR PO Take 1 tablet by mouth daily.     aspirin  EC 81 MG tablet Take 1 tablet (81 mg total) by mouth daily. Swallow whole. 30 tablet 12   calcium  carbonate (SUPER CALCIUM ) 1500 (600 Ca) MG TABS tablet Take 1 tablet by mouth daily.     Cholecalciferol  (VITAMIN D -3 PO) Take 1 capsule by mouth daily.     Cyanocobalamin  (VITAMIN B-12 PO) Take 1 tablet by mouth daily.     ELDERBERRY PO Take 1 capsule by mouth daily.     enzalutamide  (XTANDI ) 40 MG capsule Take 160 mg by mouth every evening.     fenofibrate  (TRICOR) 48 MG tablet Take 48 mg by mouth daily.     finasteride (PROSCAR) 5 MG tablet Take 5 mg by mouth daily.     icosapent Ethyl (VASCEPA) 1 g capsule Take 1 g by mouth 2 (two) times daily.     Magnesium 400 MG TABS Take 400 mg by mouth daily.     metFORMIN  (GLUCOPHAGE ) 500 MG tablet Take 500 mg by mouth 2 (two) times daily with a meal.     metoprolol  succinate (TOPROL -XL) 50 MG 24 hr tablet Take 75 mg by mouth daily. Take with or immediately following a meal.     olmesartan (BENICAR) 40 MG tablet Take 40 mg by mouth daily.     omeprazole (  PRILOSEC) 20 MG capsule Take 20 mg by mouth daily.     rosuvastatin  (CRESTOR ) 20 MG tablet Take 1 tablet (20 mg total) by mouth daily. 30 tablet 1   spironolactone  (ALDACTONE ) 50 MG tablet Take 25 mg by mouth daily.     tamsulosin  (FLOMAX ) 0.4 MG CAPS capsule Take 0.4 mg by mouth every evening.     topiramate  (TOPAMAX ) 25 MG tablet Take 1 tablet (25 mg total) by mouth 2 (two) times daily. 60 tablet 11   No facility-administered medications prior to visit.    PAST MEDICAL HISTORY: Past Medical History:  Diagnosis Date   Cancer (HCC)    prostate   CKD (chronic kidney disease) stage 2, GFR 60-89 ml/min 07/28/2022   HLD (hyperlipidemia)    Hypertension     PAST SURGICAL HISTORY: Past Surgical History:  Procedure Laterality Date   APPENDECTOMY     CHOLECYSTECTOMY     HERNIA REPAIR     PROSTATE SURGERY     prostate cancer 2014    FAMILY HISTORY: Family History  Problem Relation Age of Onset   Stroke Neg Hx     SOCIAL HISTORY: Social History   Socioeconomic History   Marital status: Married    Spouse name: Not on file   Number of children: Not on file   Years of education: Not on file   Highest education level: Not on file  Occupational History   Not on file  Tobacco Use   Smoking status: Never   Smokeless tobacco: Never  Substance and Sexual Activity   Alcohol use: No   Drug use: No   Sexual activity: Yes  Other Topics  Concern   Not on file  Social History Narrative   Not on file   Social Drivers of Health   Financial Resource Strain: Low Risk  (05/02/2022)   [facility] to [facility] prior to [facility]08/2022[facility]   Overall Financial Resource Strain (CARDIA)    Difficulty of Paying Living Expenses: Not hard at all  Food Insecurity: Low Risk  (08/16/2023)   [facility]   Hunger Vital Sign    Worried About Running Out of Food in the Last Year: Never true    Ran Out of Food in the Last Year: Never true  Transportation Needs: No Transportation Needs (08/16/2023)   Received from [employer]    In the past 12 months, has lack of reliable transportation kept you from medical appointments, meetings, work or from getting things needed for daily living? : No  Physical Activity: Unknown (05/02/2022)   [facility] to 09/08/2022[facility], [facility] visits prior to 09/08/2022., [facility]   Exercise Vital Sign    Days of Exercise per Week: Patient declined    Minutes of Exercise per Session: 0 min  Stress: Stress Concern Present (05/02/2022)   [facility] visits prior to 09/08/2022., [facility] visits prior to 09/08/2022., [facility]   [employer] of Occupational Health - Occupational Stress Questionnaire    Feeling of Stress : Very much  Social Connections: Unknown (05/02/2022)   [facility] prior to 09/08/2022., [facility] visits prior to 09/08/2022., [facility]   Social Connection and Isolation Panel [NHANES]  Frequency of Communication with Friends and Family: Once a week    Frequency of Social Gatherings with Friends and Family: Once  a week    Attends Religious Services: Patient declined    Active Member of Clubs or Organizations: Yes    Attends Banker Meetings: Never    Marital Status: Married  Catering manager Violence: Not At Risk (05/02/2022)   [facility] prior to 09/08/2022., [facility] visits prior to 09/08/2022.   Humiliation, Afraid, Rape, and Kick questionnaire    Fear of Current or Ex-Partner: No    Emotionally Abused: No    Physically Abused: No    Sexually Abused: No      PHYSICAL EXAM  Vitals:   11/27/23 1053  BP: 131/83  Pulse: 68  Weight: 205 lb (93 kg)  Height: 5\' 8"  (1.727 m)    Body mass index is 31.17 kg/m.  Generalized: Well developed, in no acute distress   Neurological examination  Mentation: Alert oriented to time, place, history taking. Follows all commands speech and language fluent Cranial nerve II-XII: Pupils were equal round reactive to light. Extraocular movements were full, visual field were full on confrontational test. Facial sensation and strength were normal. Uvula tongue midline. Head turning and shoulder shrug  were normal and symmetric. Motor: The motor testing reveals 5 over 5 strength in the left upper and lower extremities.  4/5 in the right upper and lower extremity Sensory: Sensory testing is intact to soft touch on all 4 extremities. No evidence of extinction is noted.  Coordination: Cerebellar testing reveals good finger-nose-finger and heel-to-shin bilaterally.  Gait and station: Patient uses the chair to push off with standing.  Uses a cane when ambulating.   Slight limp on the right side.   DIAGNOSTIC DATA (LABS, IMAGING, TESTING) - I reviewed patient records, labs, notes, testing and imaging myself where available.  Lab Results  Component Value Date   WBC 7.6 04/01/2023   HGB 14.9 04/01/2023   HCT 42.0 04/01/2023   MCV 85.9 04/01/2023   PLT 202 04/01/2023      Component  Value Date/Time   NA 136 04/01/2023 1249   K 3.8 04/01/2023 1249   CL 102 04/01/2023 1249   CO2 23 04/01/2023 1249   GLUCOSE 150 (H) 04/01/2023 1249   BUN 16 04/01/2023 1249   CREATININE 1.12 04/01/2023 1249   CALCIUM  9.0 04/01/2023 1249   PROT 6.8 07/28/2022 1237   ALBUMIN 4.1 07/28/2022 1237   AST 21 07/28/2022 1237   ALT 15 07/28/2022 1237   ALKPHOS 86 07/28/2022 1237   BILITOT 0.3 07/28/2022 1237   GFRNONAA >60 04/01/2023 1249   Lab Results  Component Value Date   CHOL 139 07/29/2022   HDL 23 (L) 07/29/2022   LDLCALC NOT CALCULATED 07/29/2022   LDLDIRECT 67 07/29/2022   TRIG 371 (H) 07/29/2022   CHOLHDL 6.0 07/29/2022   Lab Results  Component Value Date   HGBA1C 6.6 (H) 07/29/2022   No results found for: "VITAMINB12" Lab Results  Component Value Date   TSH 3.821 07/29/2022      ASSESSMENT AND PLAN 73 y.o. year old male  has a past medical history of Cancer (HCC), CKD (chronic kidney disease) stage 2, GFR 60-89 ml/min (07/28/2022), HLD (hyperlipidemia), and Hypertension. here with :  Stroke:  left lateral thalamic infarct likely secondary to large vessel disease source  2.   Abnormality of gait and balance 3.  Paresthesias   Continue aspirin  81 mg  for secondary stroke prevention.   HTN: BP goal <130/90.  HLD: LDL goal <70. Recent LDL 67. TG 317 DMII: A1c goal<7.0. Recent A1c 6.6.  We discussed that if he continues to have falls he should let us  know.  May need to consider another round of physical therapy Continue Topamax  25 mg twice a day FU with our office in 6 months or sooner if needed      Jerome Currier, MSN, NP-C 11/26/2023, 12:14 PM [facility] [address] [place], [name] [address] [phone]

## 2023-11-27 ENCOUNTER — Ambulatory Visit (INDEPENDENT_AMBULATORY_CARE_PROVIDER_SITE_OTHER): Payer: Medicare Other | Admitting: Adult Health

## 2023-11-27 ENCOUNTER — Encounter: Payer: Self-pay | Admitting: Adult Health

## 2023-11-27 VITALS — BP 131/83 | HR 68 | Ht 68.0 in | Wt 205.0 lb

## 2023-11-27 DIAGNOSIS — R269 Unspecified abnormalities of gait and mobility: Secondary | ICD-10-CM | POA: Diagnosis not present

## 2023-11-27 DIAGNOSIS — I6381 Other cerebral infarction due to occlusion or stenosis of small artery: Secondary | ICD-10-CM | POA: Diagnosis not present

## 2023-11-27 DIAGNOSIS — R202 Paresthesia of skin: Secondary | ICD-10-CM

## 2023-11-27 DIAGNOSIS — I69398 Other sequelae of cerebral infarction: Secondary | ICD-10-CM

## 2023-11-27 NOTE — Patient Instructions (Signed)
 Your Plan:  Continue aspirin  81 mg  for secondary stroke prevention.   HTN: BP goal <130/90.  HLD: LDL goal <70. Recent LDL 67. TG 317 DMII: A1c goal<7.0. Recent A1c 6.6.  If you continue to have falls please let us  know. may need to consider another round of physical therapy Continue Topamax  25 mg twice a day FU with our office in 6 months or sooner if needed  Thank you for coming to see us  at [facility]. I hope we have been able to provide you high quality care today.  You may receive a patient satisfaction survey over the next few weeks. We would appreciate your feedback and comments so that we may continue to improve ourselves and the health of our patients.

## 2023-11-27 NOTE — Progress Notes (Signed)
 I agree with the above plan

## 2023-12-08 ENCOUNTER — Ambulatory Visit (HOSPITAL_COMMUNITY)
Admission: EM | Admit: 2023-12-08 | Discharge: 2023-12-08 | Disposition: A | Discharge status: Home / Self Care | Attending: Internal Medicine | Admitting: Internal Medicine

## 2023-12-08 ENCOUNTER — Encounter (HOSPITAL_COMMUNITY): Payer: Self-pay

## 2023-12-08 DIAGNOSIS — R1011 Right upper quadrant pain: Secondary | ICD-10-CM | POA: Diagnosis not present

## 2023-12-08 DIAGNOSIS — W19XXXA Unspecified fall, initial encounter: Secondary | ICD-10-CM | POA: Diagnosis not present

## 2023-12-08 LAB — COMPREHENSIVE METABOLIC PANEL WITH GFR
ALT: <5 U/L (ref 0–44)
AST: 28 U/L (ref 15–41)
Albumin: 4 g/dL (ref 3.5–5.0)
Alkaline Phosphatase: 60 U/L (ref 38–126)
Anion gap: 11 (ref 5–15)
BUN: 12 mg/dL (ref 8–23)
CO2: 22 mmol/L (ref 22–32)
Calcium: 9.3 mg/dL (ref 8.9–10.3)
Chloride: 104 mmol/L (ref 98–111)
Creatinine, Ser: 0.97 mg/dL (ref 0.61–1.24)
GFR, Estimated: >60 mL/min (ref >60)
Glucose, Bld: 147 mg/dL — ABNORMAL HIGH (ref 70–99)
Potassium: 4.3 mmol/L (ref 3.5–5.1)
Sodium: 137 mmol/L (ref 135–145)
Total Bilirubin: 1.7 mg/dL — ABNORMAL HIGH (ref 0.0–1.2)
Total Protein: 6.7 g/dL (ref 6.5–8.1)

## 2023-12-08 LAB — CBC WITH DIFFERENTIAL/PLATELET
Abs Immature Granulocytes: 0.03 10*3/uL (ref 0.00–0.07)
Basophils Absolute: 0.1 10*3/uL (ref 0.0–0.1)
Basophils Relative: 1 %
Eosinophils Absolute: 0.3 10*3/uL (ref 0.0–0.5)
Eosinophils Relative: 4 %
HCT: 41.6 % (ref 39.0–52.0)
Hemoglobin: 15.4 g/dL (ref 13.0–17.0)
Immature Granulocytes: 0 %
Lymphocytes Relative: 29 %
Lymphs Abs: 2.5 10*3/uL (ref 0.7–4.0)
MCH: 31 pg (ref 26.0–34.0)
MCHC: 37 g/dL — ABNORMAL HIGH (ref 30.0–36.0)
MCV: 83.9 fL (ref 80.0–100.0)
Monocytes Absolute: 0.9 10*3/uL (ref 0.1–1.0)
Monocytes Relative: 11 %
Neutro Abs: 4.7 10*3/uL (ref 1.7–7.7)
Neutrophils Relative %: 55 %
Platelets: 210 10*3/uL (ref 150–400)
RBC: 4.96 MIL/uL (ref 4.22–5.81)
RDW: 12.6 % (ref 11.5–15.5)
WBC: 8.5 10*3/uL (ref 4.0–10.5)
nRBC: 0 % (ref 0.0–0.2)

## 2023-12-08 NOTE — ED Triage Notes (Signed)
 Patient reports that he had a fall a week ago and states his "legs gave out." Patient states he has had right flank pain since the fall and states it has been worsening.  Patient states that he has been taking Tylenol  for pain and the last dose was at noon.

## 2023-12-08 NOTE — ED Provider Notes (Signed)
 [facility]    CSN: [id number] Arrival date & time: 12/08/23  1344      History   Chief Complaint Chief Complaint  Patient presents with   Fall   Flank Pain    HPI Jerome Cardenas is a 73 y.o. male.   73 year old male who presents urgent care with complaints of right upper quadrant abdominal pain.  He is accompanied by his family.  He reports that he fell going through a door when his legs gave out about a week ago.  He landed on his right side.  Initially he did not have any significant increase in pain.  He does relate that he has some chronic pain but did not feel that this pain was much worse.  He reports that in the last 2 or 3 days he has now developed pain in the right upper quadrant.  He denies any nausea, vomiting, fevers, diarrhea, dizziness, syncope or other concerning symptoms.  The pain is worse with movement.  He is able to move his arms up and down without trouble.  He is not having any weakness in the lower extremities that is unusual for him.   Fall Pertinent negatives include no chest pain, no abdominal pain and no shortness of breath.  Flank Pain Pertinent negatives include no chest pain, no abdominal pain and no shortness of breath.    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 Authorizing Provider  amLODipine  (NORVASC ) 10 MG tablet Take 10 mg by mouth daily.    [provider]  APPLE CIDER VINEGAR PO Take 1 tablet by mouth daily.    [provider]  aspirin  EC 81 MG tablet Take 1 tablet (81 mg total) by mouth daily. Swallow whole. 07/29/22   Paige, Alger In[place], DO  calcium  carbonate (SUPER CALCIUM ) 1500 (600 Ca) MG TABS tablet Take 1 tablet by mouth daily. 04/27/21   [provider]  Cholecalciferol  (VITAMIN D -3 PO) Take 1 capsule by mouth daily.    [provider]  Cyanocobalamin  (VITAMIN B-12 PO) Take 1 tablet by mouth daily.    [provider]  ELDERBERRY PO Take 1 capsule by mouth daily.    [provider]  enzalutamide  (XTANDI ) 40 MG capsule Take 160 mg by mouth every evening.    [provider]  fenofibrate (TRICOR) 48 MG tablet Take 48 mg by mouth daily. 11/01/21   [provider]  finasteride (PROSCAR) 5 MG tablet Take 5 mg by mouth daily. 01/18/17   [provider]  icosapent Ethyl (VASCEPA) 1 g capsule Take 1 g by mouth 2 (two) times daily.    [provider]  Magnesium 400 MG TABS Take 400  mg by mouth daily.    [provider]  metFORMIN  (GLUCOPHAGE ) 500 MG tablet Take 500 mg by mouth 2 (two) times daily with a meal. Patient not taking: Reported on 11/27/2023    [provider]  metoprolol  succinate (TOPROL -XL) 50 MG 24 hr tablet Take 75 mg by mouth daily. Take with or immediately following a meal.    [provider]  olmesartan (BENICAR) 40 MG tablet Take 40 mg by mouth daily.    [provider]  omeprazole (PRILOSEC) 20 MG capsule Take 20 mg by mouth daily.    [provider]  rosuvastatin  (CRESTOR ) 20 MG tablet Take 1 tablet (20 mg total) by mouth daily. 07/29/22   Paige, Victoria J, DO  spironolactone  (ALDACTONE ) 50 MG tablet Take 25 mg by mouth daily.    [provider]  tamsulosin  (FLOMAX ) 0.4  MG CAPS capsule Take 0.4 mg by mouth every evening.    [provider]  topiramate  (TOPAMAX ) 25 MG tablet Take 1 tablet (25 mg total) by mouth 2 (two) times daily. 03/26/23 03/25/24  Clem Currier, NP    Family History Family History  Problem Relation Age of Onset   Stroke Neg Hx     Social History Social History   Tobacco Use   Smoking status: Never   Smokeless tobacco: Never  Vaping Use   Vaping status: Never Used  Substance Use Topics   Alcohol use: No   Drug use: No     Allergies   Pravachol [pravastatin]   Review of Systems Review of Systems  Constitutional:  Negative for chills and fever.  HENT:  Negative for ear pain and sore throat.   Eyes:  Negative for pain and visual disturbance.  Respiratory:  Negative for cough and shortness of breath.   Cardiovascular:  Negative for chest pain and palpitations.  Gastrointestinal:  Negative for abdominal pain, blood in stool, diarrhea, nausea and vomiting.  Genitourinary:  Positive for flank pain. Negative for dysuria and hematuria.  Musculoskeletal:  Negative for arthralgias and back pain.  Skin:  Negative for color change and rash.  Neurological:  Negative for seizures and syncope.  All other systems reviewed and are negative.    Physical Exam Triage Vital Signs ED Triage Vitals [12/08/23 1508]  Encounter Vitals Group     BP (!) 146/82     Systolic BP Percentile      Diastolic BP Percentile      Pulse Rate 68     Resp 16     Temp 98.2 F (36.8 C)     Temp Source Oral     SpO2 96 %     Weight      Height      Head Circumference      Peak Flow      Pain Score 10     Pain Loc      Pain Education      Exclude from Growth Chart    No data found.  Updated Vital Signs BP (!) 146/82 (BP Location: Left Arm)   Pulse 68   Temp 98.2 F (36.8 C) (Oral)   Resp 16   SpO2 96%   Visual Acuity Right Eye Distance:   Left Eye Distance:   Bilateral Distance:    Right Eye Near:   Left Eye Near:     Bilateral Near:     Physical Exam Vitals and nursing note reviewed.  Constitutional:      General: He is not  in acute distress.    Appearance: He is well-developed.  HENT:     Head: Normocephalic and atraumatic.  Eyes:     Conjunctiva/sclera: Conjunctivae normal.  Cardiovascular:     Rate and Rhythm: Normal rate and regular rhythm.     Heart sounds: No murmur heard. Pulmonary:     Effort: Pulmonary effort is normal. No respiratory distress.     Breath sounds: Normal breath sounds.  Abdominal:     General: There is no distension.     Palpations: Abdomen is soft.     Tenderness: There is abdominal tenderness in the right upper quadrant. There is no right CVA tenderness, left CVA tenderness, guarding or rebound. Negative signs include Murphy's sign and McBurney's sign.     Comments: Mildly tender in the right upper quadrant.  Musculoskeletal:        General: No swelling.     Cervical back: Neck supple.  Skin:    General: Skin is warm and dry.     Capillary Refill: Capillary refill takes less than 2 seconds.  Neurological:     Mental Status: He is alert.  Psychiatric:        Mood and Affect: Mood normal.      UC Treatments / Results  Labs (all labs ordered are listed, but only abnormal results are displayed) Labs Reviewed  CBC WITH DIFFERENTIAL/PLATELET  COMPREHENSIVE METABOLIC PANEL WITH GFR    EKG   Radiology No results found.  Procedures Procedures (including critical care time)  Medications Ordered in UC Medications - No data to display  Initial Impression / Assessment and Plan / UC Course  I have reviewed the triage vital signs and the nursing notes.  Pertinent labs & imaging results that were available during my care of the patient were reviewed by me and considered in my medical decision making (see chart for details).     Fall, initial encounter  Abdominal pain, right upper quadrant   Fall on the right side with right upper quadrant pain.   Physical exam findings shows some tenderness in the right upper quadrant but no other acute findings.  There is no vomiting, fevers, nausea, dizziness or syncope.  Vital signs are stable here.  We will draw lab work including complete metabolic panel and complete blood count.  The results will likely take 12 to 24 hours.  If these results are within normal limits for you then would continue to monitor the area.  If any of these results are significantly abnormal then we would have you go to the emergency room for further evaluation.  Recommend monitoring for fever, severe increase in abdominal pain, nausea, vomiting, diarrhea, dizziness or syncope and if any of these occur recommend going to the emergency department immediately.  Recommend contacting your primary care physician to schedule follow-up in the next 4 to 5 days to ensure that the pain is improving.  Final Clinical Impressions(s) / UC Diagnoses   Final diagnoses:  Fall, initial encounter  Abdominal pain, right upper quadrant     Discharge Instructions      Fall on the right side with right upper quadrant pain.  Physical exam findings shows some tenderness in the right upper quadrant but no other acute findings.  There is no vomiting, fevers, nausea, dizziness or syncope.  Vital signs are stable here.  We will draw lab work including complete metabolic panel and complete blood count.  The results will likely take 12 to 24 hours.  If these results  are within normal limits for you then would continue to monitor the area.  If any of these results are significantly abnormal then we would have you go to the emergency room for further evaluation.  Recommend monitoring for fever, severe increase in abdominal pain, nausea, vomiting, diarrhea, dizziness or syncope and if any of these occur recommend going to the emergency department immediately.  Recommend contacting your primary care physician to schedule follow-up in the next 4 to 5 days to ensure  that the pain is improving.  ED Prescriptions   None    PDMP not reviewed this encounter.   Kreg Pesa, [place] 12/08/23 1539

## 2023-12-08 NOTE — Discharge Instructions (Addendum)
 Fall on the right side with right upper quadrant pain.  Physical exam findings shows some tenderness in the right upper quadrant but no other acute findings.  There is no vomiting, fevers, nausea, dizziness or syncope.  Vital signs are stable here.  We will draw lab work including complete metabolic panel and complete blood count.  The results will likely take 12 to 24 hours.  If these results are within normal limits for you then would continue to monitor the area.  If any of these results are significantly abnormal then we would have you go to the emergency room for further evaluation.  Recommend monitoring for fever, severe increase in abdominal pain, nausea, vomiting, diarrhea, dizziness or syncope and if any of these occur recommend going to the emergency department immediately.  Recommend contacting your primary care physician to schedule follow-up in the next 4 to 5 days to ensure that the pain is improving.

## 2023-12-09 ENCOUNTER — Ambulatory Visit (HOSPITAL_COMMUNITY): Payer: Self-pay

## 2024-02-05 ENCOUNTER — Emergency Department (HOSPITAL_COMMUNITY)
Admission: EM | Admit: 2024-02-05 | Discharge: 2024-02-05 | Disposition: A | Discharge status: Home / Self Care | Attending: Emergency Medicine | Admitting: Emergency Medicine

## 2024-02-05 ENCOUNTER — Emergency Department (HOSPITAL_COMMUNITY)

## 2024-02-05 ENCOUNTER — Encounter (HOSPITAL_COMMUNITY): Payer: Self-pay

## 2024-02-05 ENCOUNTER — Other Ambulatory Visit: Payer: Self-pay

## 2024-02-05 DIAGNOSIS — Z8673 Personal history of transient ischemic attack (TIA), and cerebral infarction without residual deficits: Secondary | ICD-10-CM | POA: Diagnosis not present

## 2024-02-05 DIAGNOSIS — I129 Hypertensive chronic kidney disease with stage 1 through stage 4 chronic kidney disease, or unspecified chronic kidney disease: Secondary | ICD-10-CM | POA: Insufficient documentation

## 2024-02-05 DIAGNOSIS — E1122 Type 2 diabetes mellitus with diabetic chronic kidney disease: Secondary | ICD-10-CM | POA: Diagnosis not present

## 2024-02-05 DIAGNOSIS — Y9301 Activity, walking, marching and hiking: Secondary | ICD-10-CM | POA: Insufficient documentation

## 2024-02-05 DIAGNOSIS — S01411A Laceration without foreign body of right cheek and temporomandibular area, initial encounter: Secondary | ICD-10-CM | POA: Insufficient documentation

## 2024-02-05 DIAGNOSIS — S01111A Laceration without foreign body of right eyelid and periocular area, initial encounter: Secondary | ICD-10-CM | POA: Diagnosis not present

## 2024-02-05 DIAGNOSIS — Z7982 Long term (current) use of aspirin: Secondary | ICD-10-CM | POA: Insufficient documentation

## 2024-02-05 DIAGNOSIS — R519 Headache, unspecified: Secondary | ICD-10-CM | POA: Diagnosis not present

## 2024-02-05 DIAGNOSIS — Z859 Personal history of malignant neoplasm, unspecified: Secondary | ICD-10-CM | POA: Diagnosis not present

## 2024-02-05 DIAGNOSIS — Z7984 Long term (current) use of oral hypoglycemic drugs: Secondary | ICD-10-CM | POA: Diagnosis not present

## 2024-02-05 DIAGNOSIS — S62630A Displaced fracture of distal phalanx of right index finger, initial encounter for closed fracture: Secondary | ICD-10-CM | POA: Insufficient documentation

## 2024-02-05 DIAGNOSIS — S60011A Contusion of right thumb without damage to nail, initial encounter: Secondary | ICD-10-CM | POA: Diagnosis not present

## 2024-02-05 DIAGNOSIS — W19XXXA Unspecified fall, initial encounter: Secondary | ICD-10-CM

## 2024-02-05 DIAGNOSIS — S6291XA Unspecified fracture of right wrist and hand, initial encounter for closed fracture: Secondary | ICD-10-CM

## 2024-02-05 DIAGNOSIS — W01198A Fall on same level from slipping, tripping and stumbling with subsequent striking against other object, initial encounter: Secondary | ICD-10-CM | POA: Diagnosis not present

## 2024-02-05 DIAGNOSIS — S62610A Displaced fracture of proximal phalanx of right index finger, initial encounter for closed fracture: Secondary | ICD-10-CM | POA: Insufficient documentation

## 2024-02-05 DIAGNOSIS — N189 Chronic kidney disease, unspecified: Secondary | ICD-10-CM | POA: Insufficient documentation

## 2024-02-05 DIAGNOSIS — Z79899 Other long term (current) drug therapy: Secondary | ICD-10-CM | POA: Insufficient documentation

## 2024-02-05 DIAGNOSIS — M79644 Pain in right finger(s): Secondary | ICD-10-CM | POA: Diagnosis present

## 2024-02-05 MED ORDER — LIDOCAINE-EPINEPHRINE-TETRACAINE (LET) TOPICAL GEL
3.0000 mL | Freq: Once | TOPICAL | Status: AC
  Administered 2024-02-05: 3 mL via TOPICAL
  Filled 2024-02-05: qty 3

## 2024-02-05 MED ORDER — HYDROCODONE-ACETAMINOPHEN 5-325 MG PO TABS: 1.0000 | ORAL_TABLET | ORAL | 0 refills | Status: DC | PRN

## 2024-02-05 MED ORDER — HYDROCODONE-ACETAMINOPHEN 5-325 MG PO TABS
1.0000 | ORAL_TABLET | Freq: Once | ORAL | Status: AC
  Administered 2024-02-05: 1 via ORAL
  Filled 2024-02-05: qty 1

## 2024-02-05 MED ORDER — BACITRACIN ZINC 500 UNIT/GM EX OINT
TOPICAL_OINTMENT | Freq: Once | CUTANEOUS | Status: AC
  Administered 2024-02-05: 2 via TOPICAL
  Filled 2024-02-05: qty 1.8

## 2024-02-05 MED ORDER — TETANUS-DIPHTH-ACELL PERTUSSIS 5-2.5-18.5 LF-MCG/0.5 IM SUSY
0.5000 mL | PREFILLED_SYRINGE | Freq: Once | INTRAMUSCULAR | Status: AC
  Administered 2024-02-05: 0.5 mL via INTRAMUSCULAR
  Filled 2024-02-05: qty 0.5

## 2024-02-05 MED ORDER — BACITRACIN ZINC 500 UNIT/GM EX OINT: 1.0000 | TOPICAL_OINTMENT | Freq: Two times a day (BID) | CUTANEOUS | 0 refills | Status: DC

## 2024-02-05 NOTE — Progress Notes (Signed)
 Orthopedic Tech Progress Note Patient Details:  Clarion Mooneyhan [facility] Apr 11, 1951 [id number]  Ortho Devices Type of Ortho Device: Ace wrap, Cotton web roll, Thumb spica splint Splint Material: Fiberglass Ortho Device/Splint Location: R Thumb Ortho Device/Splint Interventions: Application, Ordered, Adjustment   Post Interventions Patient Tolerated: Well Instructions Provided: Care of device   Anyia Gierke L Xitlally Mooneyham 02/05/2024, 8:08 PM

## 2024-02-05 NOTE — ED Triage Notes (Signed)
 Pt arrives via EMS. He had a routine appointment with his endocrinologist today, when he was walking outside, he tripped over his can and fell down. PT struck his face and head on concrete. No loc, no blood thinners, pt is AxOx4. He has an abrasion/skin tear to right side of face, a laceration to his right eyebrow, and a skin tear to right index finger. Bleeding is controlled.

## 2024-02-05 NOTE — ED Notes (Signed)
 Patient transported to CT

## 2024-02-05 NOTE — ED Provider Notes (Signed)
 [place] [facility]ERGENCY DEPARTMENT AT [facility] Provider Note   CSN: [id number] Arrival date & time: 02/05/24  1608     Patient presents with: Fall  HPI Jerome Cardenas is a 73 y.o. male with history of thalamic stroke, type 2 diabetes, hypertension hyperlipidemia, CKD cancer presenting for a fall.  Occurred earlier today.  He states he was at an appointment with his endocrinologist when he walked outside and tripped and fell over landing on his right side.  He states he put a lot of weight on his right hand.  Now endorsing pain in the front of his head, abrasion to the right cheek and right eyebrow.  He also endorses some swelling and pain and lack of mobility in the right thumb as well as pain in the right little finger.  He denies loss of consciousness.  Denies chest pain or abdominal pain.  Denies preceding dizziness or palpitations or chest pain.  Per his wife has weakness on the right side since his stroke.     Fall       Prior to Admission medications   Medication Sig Start Date End Date Taking? Authorizing Provider  amLODipine  (NORVASC ) 10 MG tablet Take 10 mg by mouth daily.    [provider]  APPLE CIDER VINEGAR PO Take 1 tablet by mouth daily.    [provider]  aspirin  EC 81 MG tablet Take 1 tablet (81 mg total) by mouth daily. Swallow whole. 07/29/22   Paige, Richerd PARAS, DO  calcium  carbonate (SUPER CALCIUM ) 1500 (600 Ca) MG TABS tablet Take 1 tablet by mouth daily. 04/27/21   [provider]  Cholecalciferol  (VITAMIN D -3 PO) Take 1 capsule by mouth daily.    [provider]  Cyanocobalamin  (VITAMIN B-12 PO) Take 1 tablet by mouth daily.    [provider]  ELDERBERRY PO Take 1 capsule by mouth daily.    [provider]  enzalutamide  (XTANDI ) 40 MG capsule Take 160 mg by mouth every evening.    [provider]  fenofibrate (TRICOR) 48 MG tablet Take 48 mg by mouth daily. 11/01/21   [provider]  finasteride (PROSCAR) 5 MG tablet Take 5 mg by mouth daily. 01/18/17   [provider]  icosapent Ethyl (VASCEPA) 1 g capsule Take 1 g by mouth 2 (two) times daily.    [provider]  Magnesium 400 MG TABS Take 400 mg by mouth daily.    [provider]  metFORMIN  (GLUCOPHAGE ) 500 MG tablet Take 500 mg by mouth 2 (two) times daily with a meal. Patient not taking: Reported on 11/27/2023    [provider]  metoprolol  succinate (TOPROL -XL) 50 MG 24 hr tablet Take 75 mg by mouth daily. Take with or immediately following a meal.    [provider]  olmesartan (BENICAR) 40 MG tablet Take 40 mg by mouth daily.    [provider]  omeprazole (PRILOSEC) 20 MG capsule Take 20 mg by mouth daily.    [provider]  rosuvastatin  (CRESTOR ) 20 MG tablet Take 1 tablet (20 mg total) by mouth daily. 07/29/22   Paige, Victoria J, DO  spironolactone  (ALDACTONE ) 50 MG tablet Take 25 mg by mouth daily.    [provider]  tamsulosin  (FLOMAX ) 0.4 MG CAPS capsule Take 0.4 mg by mouth every evening.    [provider]  topiramate  (TOPAMAX ) 25 MG tablet Take 1 tablet (25 mg total) by mouth 2 (two) times daily. 03/26/23  03/25/24  Sherryl Bouchard, NP    Allergies: Pravachol [pravastatin]    Review of Systems See HPI   Physical Exam   Vitals:   02/05/24 2015 02/05/24 2033  BP:  (!) 148/76  Pulse: (!) 57 62  Resp:  18  Temp:  98.5 F (36.9 C)  SpO2: 100% 100%    CONSTITUTIONAL:  well-appearing, NAD NEURO:  Alert and oriented x 3, CN 3-12 grossly intact, moving all extremities, notable weakness in the right arm and right leg but near baseline per his wife, sensation intact to light touch EYES:  eyes equal and reactive ENT/NECK:  Supple, no stridor  Face: Tiny laceration overlying the right eyebrow, skin tear to the right cheek CARDIO:  regular rate and rhythm, appears well-perfused  PULM:  No respiratory distress,  CTAB GI/GU:  non-distended, soft, non tender MSK/SPINE:  No gross deformities, swelling and ecchymosis noted to the right thumb with limited mobility.  Having difficulty flexing extending the thumb.  SKIN:  no rash, atraumatic   *Additional and/or pertinent findings included in MDM below    (all labs ordered are listed, but only abnormal results are displayed) Labs Reviewed - No data to display  EKG: None  Radiology: CT Cervical Spine Wo Contrast Result Date: 02/05/2024 EXAM: CT CERVICAL SPINE WITHOUT CONTRAST 02/05/2024 05:34:00 PM TECHNIQUE: CT of the cervical spine was performed without the administration of intravenous contrast. Multiplanar reformatted images are provided for review. Automated exposure control, iterative reconstruction, and/or weight based adjustment of the mA/kV was utilized to reduce the radiation dose to as low as reasonably achievable. COMPARISON: None available. CLINICAL HISTORY: Polytrauma, blunt. Fall; CT Head Wo Contrast; Polytrauma, blunt; CT Cervical Spine Wo Contrast; Polytrauma, blunt; CT Maxillofacial WO CM; Facial trauma, blunt; See ED Notes:; Pt arrives via EMS. He had a routine appointment with his endocrinologist today, when he was walking outside, he tripped over his can and fell down. PT struck his face and head on concrete. No loc, no blood thinners, pt is AxOx4. He has an abrasion/skin tear to right side of face, a laceration to his right eyebrow, and a skin tear to right index finger. Bleeding is controlled. FINDINGS: CERVICAL SPINE: BONES AND ALIGNMENT: Cervical lordosis is maintained. No significant listhesis. No facet subluxation or dislocation. No compression fracture or displaced fracture in the cervical spine. No suspicious osseous lesion. DEGENERATIVE CHANGES: Degenerative changes at the atlanto-dens articulation. Degenerative endplate osteophytes at multiple levels. There are small disc osteophyte complexes at multiple levels. Facet arthrosis and  uncovertebral hypertrophy throughout the cervical spine. There is moderate foraminal stenosis at multiple levels. No high-grade osseous spinal canal stenosis. SOFT TISSUES: There is no significant prevertebral soft tissue swelling. The paraspinal soft tissues are unremarkable. The visualized lung apices are unremarkable. IMPRESSION: 1. No acute abnormality of the cervical spine related to the reported trauma. 2. Degenerative changes as above. Electronically signed by: Donnice Mania MD 02/05/2024 06:07 PM EDT RP Workstation: [id number]   CT Maxillofacial WO CM Result Date: 02/05/2024 EXAM: CT OF THE FACE WITHOUT CONTRAST 02/05/2024 05:34:00 PM TECHNIQUE: CT of the face was performed without the administration of intravenous contrast. Multiplanar reformatted images are provided for review. Automated exposure control, iterative reconstruction, and/or weight based adjustment of the mA/kV was utilized to reduce the radiation dose to as low as reasonably achievable. COMPARISON: None available. CLINICAL HISTORY: Facial trauma, blunt. Fall; CT Head Wo Contrast; Polytrauma, blunt; CT Cervical Spine Wo Contrast; Polytrauma, blunt; CT Maxillofacial WO CM; Facial trauma, blunt; See  ED Notes:; Pt arrives via EMS. He had a routine appointment with his endocrinologist today, when he was walking outside, he tripped over his can and fell down. PT struck his face and head on concrete. No loc, no blood thinners, pt is AxOx4. He has an abrasion/skin tear to right side of face, a laceration to his right eyebrow, and a skin tear to right index finger. Bleeding is controlled. FINDINGS: FACIAL BONES: The maxillofacial bones are intact without evidence of fracture or destructive osseous lesion. There is a transversely oriented fracture through the right maxillary central incisor without significant displacement. There is no mandibular condyle dislocation. ORBITS: The globes are intact. Lenses are normally located. The extraocular muscles  and optic nerve sheath complexes are unremarkable. There is no evidence of postseptal soft tissue swelling within the right orbit. SINUSES AND MASTOIDS: There is mild mucosal thickening within the alveolar recess of the right maxillary sinus. No air-fluid levels. The mastoid air cells are clear. SOFT TISSUES: Soft tissue swelling over the right aspect of the forehead with overlying locules of gas suggestive of laceration. Soft tissue swelling over the right supraorbital ridge with involvement of the right periorbital soft tissues and right facial soft tissues. There is no abnormality of the deeper spaces of the upper neck within the visualized neck. IMPRESSION: 1. Transversely oriented fracture through the right maxillary central incisor without significant displacement. 2. Soft tissue swelling over the right aspect of the forehead with overlying locules of gas suggestive of laceration. 3. Right periorbital and facial soft tissue swelling. Electronically signed by: Donnice Mania MD 02/05/2024 06:01 PM EDT RP Workstation: [id number]   CT Head Wo Contrast Result Date: 02/05/2024 EXAM: CT HEAD WITHOUT 02/05/2024 05:34:00 PM TECHNIQUE: CT of the head was performed without the administration of intravenous contrast. Automated exposure control, iterative reconstruction, and/or weight based adjustment of the mA/kV was utilized to reduce the radiation dose to as low as reasonably achievable. COMPARISON: MRI head dated Jul 28 2022. CLINICAL HISTORY: Polytrauma, blunt. Fall; Pt arrives via EMS. He had a routine appointment with his endocrinologist today, when he was walking outside, he tripped over his can and fell down. PT struck his face and head on concrete. No loc, no blood thinners, pt is AxOx4. He has an abrasion/skin tear to right side of face, a laceration to his right eyebrow, and a skin tear to right index finger. Bleeding is controlled. FINDINGS: BRAIN AND VENTRICLES: No acute intracranial hemorrhage. No mass  effect or midline shift. No extra-axial fluid collection. Gray-white differentiation is maintained. No hydrocephalus. Nonspecific hypoattenuation in the periventricular and subcortical white matter, most likely representing chronic small vessel disease. Remote lacunar infarct in the left thalamus. ORBITS: No acute abnormality. SINUSES AND MASTOIDS: No acute abnormality. SOFT TISSUES AND SKULL: Right forehead hematoma with overlying loculus of gas suggestive of laceration. Soft tissue swelling extends over the supraorbital ridge into the right periorbital soft tissues and right facial soft tissues along the inferolateral aspect of the orbit overlying the maxillary sinus. No acute skull fracture. IMPRESSION: 1. No acute intracranial abnormality. 2. Right forehead hematoma with overlying gas suggestive of laceration. Electronically signed by: Donnice Mania MD 02/05/2024 05:54 PM EDT RP Workstation: [id number]   DG Hand Complete Right Result Date: 02/05/2024 CLINICAL DATA:  Right hand pain after fall today. EXAM: RIGHT HAND - COMPLETE 3+ VIEW COMPARISON:  None Available. FINDINGS: Mildly displaced fracture is seen involving the dorsal aspect of the proximal base of the first distal phalanx. Minimally displaced fracture  is also seen involving the dorsal aspect of the distal portion of the first proximal phalanx. Overlying soft tissue swelling is noted. Possible nondisplaced oblique fracture is seen involving fifth metacarpal. Severe degenerative changes seen involving the first carpometacarpal joint. IMPRESSION: Mildly displaced fracture involving proximal base of first distal phalanx with minimal displaced fracture involving dorsal aspect of dorsal portion of first proximal phalanx. Possible nondisplaced fifth metacarpal oblique fracture. Electronically Signed   By: Lynwood Landy Raddle M.D.   On: 02/05/2024 17:52     Procedures   Medications Ordered in the ED  HYDROcodone -acetaminophen  (NORCO/VICODIN) 5-325 MG per  tablet 1 tablet (1 tablet Oral Given 02/05/24 1721)  lidocaine -EPINEPHrine -tetracaine  (LET) topical gel (3 mLs Topical Given 02/05/24 1756)  Tdap (BOOSTRIX ) injection 0.5 mL (0.5 mLs Intramuscular Given 02/05/24 1756)  HYDROcodone -acetaminophen  (NORCO/VICODIN) 5-325 MG per tablet 1 tablet (1 tablet Oral Given 02/05/24 2020)  bacitracin  ointment (2 Applications Topical Given 02/05/24 2058)                                    Medical Decision Making Amount and/or Complexity of Data Reviewed Radiology: ordered.  Risk Prescription drug management.   73 year old well-appearing male presenting for mechanical fall.  Exam notable for abrasions to the right eyebrow and right cheek, swelling and bruising in the right thumb with limited mobility.  X-ray of the right hand showed mildly displaced fracture involving proximal base of first distal phalanx with minimal displaced fracture involving dorsal aspect of dorsal portion of first proximal phalanx. Possible nondisplaced fifth metacarpal oblique fracture.  CT max face showed transversely oriented fracture through the right maxillary central incisor without significant displacement. 2. Soft tissue swelling over the right aspect of the forehead with overlying locules of gas suggestive of laceration. 3. Right periorbital and facial soft tissue swelling.  No acute abnormalities on CT head and cervical spine.  Discussed the fractures in the right hand with Dr. Romona of hand surgery.  He recommended thumb spica splint which we applied and advised to have him follow-up in his office.  Inspected the mouth and there did appear to be tiny tooth fracture.  Sent a few tablets of Norco to his pharmacy.  Advised to wound care at home.  Also advised if the right cheek skin tear does not improve he may need to follow-up with wound.  Also advised PCP follow-up.  Discharge condition.     Final diagnoses:  Fall, initial encounter  Closed fracture of right hand, initial  encounter    ED Discharge Orders     None          Jerome Cardenas 02/05/24 2107    Jerome Lamar BROCKS, MD 02/06/24 [phone]

## 2024-02-05 NOTE — Discharge Instructions (Signed)
 Evaluation revealed that you have a fracture in your right thumb and right little finger.  Please follow-up with Dr. Romona in his office.  In the meantime please keep the splint in place until then.  I sent a few tablets of Norco to your pharmacy as well.  Also continue to clean the wound on your right cheek and apply bacitracin  twice a day.  If your wound on your right cheek and over your right eyebrow does not improve please follow-up with wound care.

## 2024-02-28 ENCOUNTER — Encounter (INDEPENDENT_AMBULATORY_CARE_PROVIDER_SITE_OTHER): Payer: Medicare Other | Admitting: Ophthalmology

## 2024-02-28 DIAGNOSIS — H35033 Hypertensive retinopathy, bilateral: Secondary | ICD-10-CM

## 2024-02-28 DIAGNOSIS — D3132 Benign neoplasm of left choroid: Secondary | ICD-10-CM

## 2024-02-28 DIAGNOSIS — I10 Essential (primary) hypertension: Secondary | ICD-10-CM

## 2024-02-28 DIAGNOSIS — E113293 Type 2 diabetes mellitus with mild nonproliferative diabetic retinopathy without macular edema, bilateral: Secondary | ICD-10-CM

## 2024-02-28 DIAGNOSIS — H43813 Vitreous degeneration, bilateral: Secondary | ICD-10-CM

## 2024-02-28 DIAGNOSIS — Z7984 Long term (current) use of oral hypoglycemic drugs: Secondary | ICD-10-CM | POA: Diagnosis not present

## 2024-02-28 DIAGNOSIS — H2513 Age-related nuclear cataract, bilateral: Secondary | ICD-10-CM

## 2024-03-10 NOTE — Progress Notes (Signed)
 [facility]  - INTERNAL MEDICINE [place] Follow Up Visit Jerome Cardenas DOB: 1951-04-16  MRN: [id number]  Visit Date: 03/10/2024 Encounter Provider: Tinnie Almarie Forts, PA-C  PCP: Tinnie Almarie Forts, PA-C  Subjective:    Chief Complaint  Patient presents with  . Depression    Pt states he is fasting and has been taking his meds. He states he feels the same w/o any improvement on the med.   Depression/Anxiety Patient presents for depression/anxiety follow up.  Currently medications reviewed.  Doing well w/o side effects.  Taking medication daily as prescribed.  Current symptoms: depressed mood, hypersomnia, fatigue, difficulty concentrating, and impaired memory. No active SI/HI. Patient states that overall, he feels ok. Obviously has been more down with his thumb fracture/splint.    The following portions of the patient's history were reviewed and updated as appropriate: Patient Active Problem List   Diagnosis Date Noted  . History of CVA (cerebrovascular accident) 03/10/2024  . Hemiparesis of right dominant side as late effect of cerebral infarction    (CMD) 03/10/2024  . Closed fracture of shaft of fifth metacarpal bone of right hand 02/07/2024  . Displaced fracture of distal phalanx of right thumb, initial encounter for closed fracture 02/07/2024  . Albuminuria 12/20/2022  . Metastasis to bone (HCC) 11/01/2021  . Metabolic bone disease 06/14/2020  . Nephrolithiasis 12/10/2019  . Diabetes mellitus with stage 2 chronic kidney disease (HCC) 04/03/2018  . Vitamin D  deficiency 05/26/2015  . Erectile dysfunction associated with type 2 diabetes mellitus (HCC) 11/27/2013  . Essential hypertension 11/27/2013  . GERD without esophagitis 11/27/2013  . Mixed hyperlipidemia due to type 2 diabetes mellitus (HCC) 11/27/2013  . Prostate carcinoma (HCC) 11/27/2013  . Generalized osteoarthrosis, involving multiple sites 12/13/2012     Current  Outpatient Medications  Medication Instructions  . amLODIPine  (NORVASC ) 10 mg, oral, Daily  . apple cider vinegar 500 mg tab 1 tablet, Daily  . aspirin  81 mg, Daily  . calcium  carbonate (OS-CAL) 1500 mg (600 mg calcium ) tablet 1 tablet, Daily  . cholecalciferol  (VITAMIN D3) 2,000 Units, oral, Daily  . cyanocobalamin  (VITAMIN B12) 1,000 mg, Daily  . DULoxetine  (CYMBALTA ) 60 mg, oral, Daily  . enzalutamide  (Xtandi ) 40 mg capsule TAKE 4 CAPSULES (160 MG) DAILY  . finasteride (PROSCAR) 5 mg, Daily  . gabapentin (NEURONTIN) 100 mg capsule Take 1 to 3 capsules before bedtime as needed for insomnia and right sided pain.  SABRA glucose blood (FreeStyle Lite Strips) test strip USE TO MONITOR BLOOD GLUCOSE 1 TO 2 TIMES DAILY  . icosapent ethyL 1 gram cap TAKE 2 CAPSULES (2 GRAMS) IN THE MORNING AND 2 CAPSULES (2 GRAMS) IN THE EVENING. TAKE WITH MEALS  . Lancets misc   . magnesium oxide 400 mg  . menthol-colloidal oatmeal (Eucerin Calm Itch,menthol-oat,) 0.1 % lotn Apply twice a day on body  . metFORMIN  (GLUCOPHAGE ) 500 mg tablet TAKE 1 TABLET ONCE DAILY AFTER LARGEST MEAL  . metoprolol  succinate (TOPROL  XL) 75 mg, oral, Daily  . multivitamin-iron-minerals-folic acid (Thera-M) 27-0.4 mg tab 1 tablet, Daily  . olmesartan (BENICAR) 40 mg, oral, Daily  . omeprazole (PRILOSEC) 20 mg, oral, Daily  . rosuvastatin  (CRESTOR ) 20 mg, oral, Daily  . spironolactone  (ALDACTONE ) 50 mg tablet TAKE 1 TABLET DAILY (DOSE INCREASE)  . tamsulosin  (FLOMAX ) 0.4 mg, Daily  . topiramate  (TOPAMAX ) 25 mg, Daily  . vit C-zinc  citrate-elderberry (Elderberry Immune Health) 45-3.75-50 mg chew TAKE 1 TABLET A DAY      He  has a past surgical history that includes Appendectomy; Other surgical history; Mouth surgery; Hernia repair; Cholecystectomy; and Transurethral resection of prostate (N/A, 01/16/2023). His family history includes Heart disease in his father and mother; Lung cancer in his father, maternal grandfather, and paternal  grandfather. He is allergic to pravastatin..   Review of Systems - History obtained from the patient Complete ROS negative except those noted in the HPI or elsewhere in note  Objective  Objective    Physical Exam Vital signs and nursing note reviewed. BP (!) 162/88   Pulse 62   Temp 96.8 F (36 C) (Temporal)   Ht 1.778 m (5' 10)   Wt 91.7 kg (202 lb 3.2 oz)   SpO2 97%   BMI 29.01 kg/m    General appearance: alert, appears stated age and cooperative Head: Normocephalic and atrauamatic Eyes: Conjunctiva clear, EOMs intact, no scleral icterus noted Neck: no adenopathy, no carotid bruit, no JVD; neck is supple, symmetrical; trachea midline and thyroid  not enlarged, symmetric, no tenderness/mass/nodules Heart: regular rate and rhythm, S1, S2 normal, no murmur, click, rub or gallop Lungs: clear to auscultation bilaterally; no wheezes, rhonchi, rales and in no acute distress. Breathing is unlabored. Extremities: extremities normal, atraumatic, no cyanosis or edema Pulses: 2+ and symmetric  Psych: Mood and affect appropriate Skin: color, texture, and turgor normal; no visualized rashes or lesions   Recent lab work reviewed and analyzed: Lab Results  Component Value Date   WBC 6.99 02/18/2024   HGB 15.1 02/18/2024   HCT 42.3 02/18/2024   MCV 86.5 02/18/2024   PLT 205 02/18/2024   ALT 5 (L) 02/18/2024   AST 8 (L) 02/18/2024   CREATININE 1.13 02/18/2024   BUN 18 02/18/2024   EGFR 68 02/18/2024   NA 140 02/18/2024   K 4.2 02/18/2024   CL 104 02/18/2024   CO2 30 02/18/2024   PSA 0.00 02/18/2024   VITAMINB12 759 01/28/2024   VITD 48.5 06/21/2023   HGBA1C 7.1 (H) 01/28/2024   LDLCALC 70 02/11/2023     Recent imaging reviewed and analyzed: Endo Colon Table formatting from the original result was not included. Staff Role  Clem JINNY Sero, MD Proceduralist  Rocky Macario Louder, CRNA CRNA  Rolin Anette Faden, RN Endo Nurse  Norleen Dallas Hock, MD Anesthesiologist   [facility] Endo Technician   Impression 2 polyps One 12 mm polyp in the proximal ascending colon; performed hot snare  removal One 10 mm polyp in the ascending colon; performed hot snare removal Performed pancolonic forceps biopsies to rule out microscopic colitis The terminal ileum appeared normal. (grade 1) hemorrhoids  Recommendation Await pathology results  Repeat colonoscopy in 3 years, due: 03/12/2026      Indication Colon cancer screening  Medications See Anesthesia Record.  Preprocedure A history and physical has been performed, and patient medication  allergies have been reviewed. The patient's tolerance of previous  anesthesia has been reviewed. The risks and benefits of the procedure and  the sedation options and risks were discussed with the patient. All  questions were answered and informed consent obtained.  Details of the Procedure The patient underwent monitored anesthesia care, which was administered by  an anesthesia professional. The patient's blood pressure, ECG, ETCO2,  heart rate, level of consciousness, oxygen and respirations were monitored  throughout the procedure. A digital rectal exam was performed. A perianal  exam was performed. The scope was introduced through the anus and advanced  to the terminal ileum, 10 cm from the  ileocecal valve. Retroflexion was  performed in the rectum. The quality of bowel preparation was evaluated  using the [facility] Bowel Preparation Scale with scores of: right colon = 2,  transverse colon = 2, left colon = 2. The total BBPS score was 6. Bowel  prep was adequate. The patient experienced no blood loss. The procedure  was not difficult. The patient tolerated the procedure well. There were no  apparent adverse events.   Events Procedure Events  Event Event Time  LOWER SCOPE IN TIME 03/13/2023 10:59 AM  ENDO CECUM REACHED 03/13/2023 11:03 AM  LOWER SCOPE OUT TIME 03/13/2023 11:24 AM   Findings Polyp One 12  mm pedunculated polyp in the proximal ascending colon; no bleeding  was identified; performed hot snare with complete en bloc removal and  retrieved specimen One 10 mm pedunculated polyp in the ascending colon; no bleeding was  identified; performed hot snare with complete en bloc removal and  retrieved specimen One 2 mm sessile polyp in the distal ascending colon; no bleeding was  identified; performed cold forceps biopsy with complete en bloc removal.  The colon was otherwise normal. Performed 16 random pancolonic forceps biopsies to rule out microscopic  colitis The terminal ileum appeared normal. Internal (grade 1) hemorrhoids; no bleeding was identified  Post Procedure Diagnosis  No diagnosis.   Specimens ID Type Source Tests Collected by Time  1 : random colon bx r/o microscopic colitis Tissue Colon, Random TISSUE  EXAM Clem JINNY Sero, MD 03/13/2023 1107  2 : Ascending colon polyp x3 r/o adenoma Tissue Colon Polyp,  Right/Ascending TISSUE EXAM Clem JINNY Sero, MD 03/13/2023 1125   Referring Provider Sero Clem JINNY, MD  Last Colonoscopy 09/11/2019   Results for orders placed or performed in visit on 02/18/24  Comprehensive Metabolic Panel   Collection Time: 02/18/24  8:47 AM  Result Value Ref Range   Sodium 140 136 - 145 mmol/L   Potassium 4.2 3.4 - 4.5 mmol/L   Chloride 104 98 - 107 mmol/L   CO2 30 21 - 31 mmol/L   Anion Gap 6 6 - 14 mmol/L   Glucose, Random 177 (H) 70 - 99 mg/dL   Blood Urea Nitrogen (BUN) 18 7 - 25 mg/dL   Creatinine 8.86 9.29 - 1.30 mg/dL   eGFR 68 >40 fO/fpw/8.26f7   Albumin 4.4 3.5 - 5.7 g/dL   Total Protein 7.1 6.4 - 8.9 g/dL   Bilirubin, Total 0.6 0.3 - 1.0 mg/dL   Alkaline Phosphatase (ALP) 102 34 - 104 U/L   Aspartate Aminotransferase (AST) 8 (L) 13 - 39 U/L   Alanine Aminotransferase (ALT) 5 (L) 7 - 52 U/L   Calcium  9.6 8.6 - 10.3 mg/dL   BUN/Creatinine Ratio    PSA, Total   Collection Time: 02/18/24  8:47 AM  Result Value Ref  Range   PSA, Total 0.00 0.00 - 6.50 ng/mL  CBC with Differential   Collection Time: 02/18/24  8:47 AM  Result Value Ref Range   WBC 6.99 4.40 - 11.00 10*3/uL   RBC 4.90 4.50 - 5.90 10*6/uL   Hemoglobin 15.1 14.0 - 17.5 g/dL   Hematocrit 57.6 58.4 - 50.4 %   Mean Corpuscular Volume (MCV) 86.5 80.0 - 96.0 fL   Mean Corpuscular Hemoglobin (MCH) 30.8 27.5 - 33.2 pg   Mean Corpuscular Hemoglobin Conc (MCHC) 35.6 33.0 - 37.0 g/dL   Red Cell Distribution Width (RDW) 14.4 12.3 - 17.0 %   Platelet Count (PLT) 205 150 - 450 10*3/uL  Mean Platelet Volume (MPV) 7.4 6.8 - 10.2 fL   Neutrophils % 47 %   Lymphocytes % 35 %   Monocytes % 11 %   Eosinophils % 6 %   Basophils % 1 %   Neutrophils Absolute 3.30 1.80 - 7.80 10*3/uL   Lymphocytes # 2.50 1.00 - 4.80 10*3/uL   Monocytes # 0.80 0.00 - 0.80 10*3/uL   Eosinophils # 0.40 0.00 - 0.50 10*3/uL   Basophils # 0.10 0.00 - 0.20 10*3/uL        Assessment/Plan    1. Moderate episode of recurrent major depressive disorder (CMD) (Primary) Increase cymbalta  to 60 mg daily. Return in 2 months to recheck - DULoxetine  (CYMBALTA ) 60 mg capsule; Take 1 capsule (60 mg total) by mouth daily.  Dispense: 90 capsule; Refill: 3  2. Closed nondisplaced fracture of shaft of fifth metacarpal bone of right hand with routine healing, subsequent encounter 3. Displaced fracture of distal phalanx of right thumb, initial encounter for closed fracture Stable; has f/u with orthopedics in a few weeks to repeat xrays.   4. Daytime hypersomnolence They canceled neurology appt due to fall. Sleep study ordered.  - Wake Only-Adult Home Sleep Apnea Test Order (Non-Sleep Provider); Future  5. History of CVA (cerebrovascular accident) 6. Hemiparesis of right dominant side as late effect of cerebral infarction    (CMD) Getting a chair lift up the stairs given weakness and recent falls downstairs    Patient expresses understanding of their current medications and use.   If a new prescription was given today, then I discussed potential side effects, drug interactions, instructions for taking the medication, and the consequences of not taking it.   Patient verbalized an understanding of these instructions. Patient's medical and personal goals were discussed today. Barriers to current goals: none evident The following portions of the patient's history were reviewed and updated as appropriate: allergies, current medications, past family history, past medical history, past social history, past surgical history and problem list. Return in about 2 months (around 05/10/2024). Call sooner if need arises.   Future Appointments  Date Time Provider Department Center  03/20/2024  1:00 PM Arlyne Kitty Deepstep, MSW, LCSW [facility] [address]  05/20/2024 10:00 AM [facility] HEM ONC HP PERIPHERAL LAB [facility]  05/20/2024 10:30 AM Selinda Vicenta Albino, MD [facility]  06/23/2024  1:20 PM Helyn Alston, MD [facility] NEP PRE [facility]    This document serves as a record of services personally performed by Tinnie Forts, PA-C.  It was created on their behalf by Ona LITTIE Mandril, CMA, a trained medical scribe, and Certified Medical Assistant (CMA). During the course of documenting the history, physical exam and medical decision making, I was functioning as a Stage manager. The creation of this record is the provider's dictation and/or activities during the visit.   Electronically signed by Ona LITTIE Mandril, CMA 03/10/2024 10:32 AM    I agree the documentation is accurate and complete.  Electronically signed by: Tinnie Almarie Forts, PA-C 03/10/2024 11:08 AM

## 2024-03-25 NOTE — Progress Notes (Signed)
 FOLLOW UP ENCOUNTER FOR INDIVIDUAL OUTPATIENT COUNSELING  Service Stop/Start time: Start time:  1:01 pm Stop time:  1:55 pm  Purpose: Therapy to address treatment plan goals.  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  Last session completed was 03/06/2024.  Jerome Cardenas has his brace off of his right hand/arm and reports the only thing still bothering him is the thumb on his right hand. He states he has had no more falls since last session. They will have chair lift installed this coming Monday. Jerome Cardenas reports he is still sleeping much of the time during the day and night. He is not sure if [facility] has rescheduled sleep study. He feels he continues to be forgetful. He agrees to MMSE which was scored at 26 out of 30. The items he got wrong were all related to time. He got the year, month and date wrong. Got the day of the wk correctly. LCSW assessed for status of his spouse selling his car. He reports car not being sold and he has been driving. He did not drive today but has driven to another doctor appointment closer to home. Marval drove him today. He denies skipping meals, is out of protein drinks again saying he is getting some today while they are out. Going out to lunch and to store after session. 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 Reviewed coping, poc and scheduling prior to close of care.SABRA Jerome Cardenas states  appreciation for care. Post session Debbie states PCP facilitating home sleep study.  Portions of this note were dictated using Animal nutritionist.  It has been reviewed for accuracy, but may contain grammatical and clerical errors.

## 2024-04-03 ENCOUNTER — Other Ambulatory Visit: Payer: Self-pay

## 2024-04-03 ENCOUNTER — Emergency Department (HOSPITAL_COMMUNITY)

## 2024-04-03 ENCOUNTER — Inpatient Hospital Stay (HOSPITAL_COMMUNITY): Admission: EM | Admit: 2024-04-03 | Discharge: 2024-05-09 | DRG: 064 | Disposition: E

## 2024-04-03 ENCOUNTER — Encounter (HOSPITAL_COMMUNITY): Payer: Self-pay

## 2024-04-03 ENCOUNTER — Observation Stay (HOSPITAL_COMMUNITY)

## 2024-04-03 DIAGNOSIS — E1165 Type 2 diabetes mellitus with hyperglycemia: Secondary | ICD-10-CM | POA: Diagnosis present

## 2024-04-03 DIAGNOSIS — Z7982 Long term (current) use of aspirin: Secondary | ICD-10-CM

## 2024-04-03 DIAGNOSIS — R7989 Other specified abnormal findings of blood chemistry: Secondary | ICD-10-CM | POA: Diagnosis not present

## 2024-04-03 DIAGNOSIS — E785 Hyperlipidemia, unspecified: Secondary | ICD-10-CM | POA: Diagnosis present

## 2024-04-03 DIAGNOSIS — E1122 Type 2 diabetes mellitus with diabetic chronic kidney disease: Secondary | ICD-10-CM | POA: Diagnosis present

## 2024-04-03 DIAGNOSIS — I2489 Other forms of acute ischemic heart disease: Secondary | ICD-10-CM | POA: Diagnosis present

## 2024-04-03 DIAGNOSIS — N400 Enlarged prostate without lower urinary tract symptoms: Secondary | ICD-10-CM | POA: Diagnosis present

## 2024-04-03 DIAGNOSIS — M4854XA Collapsed vertebra, not elsewhere classified, thoracic region, initial encounter for fracture: Secondary | ICD-10-CM | POA: Diagnosis present

## 2024-04-03 DIAGNOSIS — I129 Hypertensive chronic kidney disease with stage 1 through stage 4 chronic kidney disease, or unspecified chronic kidney disease: Secondary | ICD-10-CM | POA: Diagnosis present

## 2024-04-03 DIAGNOSIS — I639 Cerebral infarction, unspecified: Secondary | ICD-10-CM | POA: Diagnosis present

## 2024-04-03 DIAGNOSIS — E872 Acidosis, unspecified: Secondary | ICD-10-CM | POA: Diagnosis present

## 2024-04-03 DIAGNOSIS — Z9181 History of falling: Secondary | ICD-10-CM

## 2024-04-03 DIAGNOSIS — D329 Benign neoplasm of meninges, unspecified: Secondary | ICD-10-CM | POA: Diagnosis present

## 2024-04-03 DIAGNOSIS — E669 Obesity, unspecified: Secondary | ICD-10-CM | POA: Diagnosis present

## 2024-04-03 DIAGNOSIS — F419 Anxiety disorder, unspecified: Secondary | ICD-10-CM | POA: Diagnosis present

## 2024-04-03 DIAGNOSIS — G8191 Hemiplegia, unspecified affecting right dominant side: Secondary | ICD-10-CM | POA: Diagnosis present

## 2024-04-03 DIAGNOSIS — R918 Other nonspecific abnormal finding of lung field: Secondary | ICD-10-CM

## 2024-04-03 DIAGNOSIS — J9601 Acute respiratory failure with hypoxia: Secondary | ICD-10-CM | POA: Diagnosis present

## 2024-04-03 DIAGNOSIS — J9811 Atelectasis: Secondary | ICD-10-CM | POA: Diagnosis present

## 2024-04-03 DIAGNOSIS — K219 Gastro-esophageal reflux disease without esophagitis: Secondary | ICD-10-CM | POA: Diagnosis present

## 2024-04-03 DIAGNOSIS — R6520 Severe sepsis without septic shock: Secondary | ICD-10-CM | POA: Diagnosis not present

## 2024-04-03 DIAGNOSIS — Z888 Allergy status to other drugs, medicaments and biological substances status: Secondary | ICD-10-CM

## 2024-04-03 DIAGNOSIS — G9341 Metabolic encephalopathy: Secondary | ICD-10-CM | POA: Diagnosis not present

## 2024-04-03 DIAGNOSIS — Z79899 Other long term (current) drug therapy: Secondary | ICD-10-CM

## 2024-04-03 DIAGNOSIS — I63519 Cerebral infarction due to unspecified occlusion or stenosis of unspecified middle cerebral artery: Principal | ICD-10-CM | POA: Diagnosis present

## 2024-04-03 DIAGNOSIS — A419 Sepsis, unspecified organism: Secondary | ICD-10-CM | POA: Diagnosis not present

## 2024-04-03 DIAGNOSIS — Z8673 Personal history of transient ischemic attack (TIA), and cerebral infarction without residual deficits: Secondary | ICD-10-CM

## 2024-04-03 DIAGNOSIS — Z7902 Long term (current) use of antithrombotics/antiplatelets: Secondary | ICD-10-CM

## 2024-04-03 DIAGNOSIS — Z7984 Long term (current) use of oral hypoglycemic drugs: Secondary | ICD-10-CM

## 2024-04-03 DIAGNOSIS — C61 Malignant neoplasm of prostate: Secondary | ICD-10-CM | POA: Diagnosis present

## 2024-04-03 DIAGNOSIS — I959 Hypotension, unspecified: Secondary | ICD-10-CM | POA: Diagnosis present

## 2024-04-03 DIAGNOSIS — M4855XA Collapsed vertebra, not elsewhere classified, thoracolumbar region, initial encounter for fracture: Secondary | ICD-10-CM | POA: Diagnosis present

## 2024-04-03 DIAGNOSIS — E1151 Type 2 diabetes mellitus with diabetic peripheral angiopathy without gangrene: Secondary | ICD-10-CM | POA: Diagnosis not present

## 2024-04-03 DIAGNOSIS — Z66 Do not resuscitate: Secondary | ICD-10-CM | POA: Diagnosis present

## 2024-04-03 DIAGNOSIS — M4802 Spinal stenosis, cervical region: Secondary | ICD-10-CM | POA: Diagnosis present

## 2024-04-03 DIAGNOSIS — F32A Depression, unspecified: Secondary | ICD-10-CM | POA: Diagnosis present

## 2024-04-03 DIAGNOSIS — N179 Acute kidney failure, unspecified: Secondary | ICD-10-CM | POA: Diagnosis present

## 2024-04-03 DIAGNOSIS — E878 Other disorders of electrolyte and fluid balance, not elsewhere classified: Secondary | ICD-10-CM | POA: Diagnosis not present

## 2024-04-03 DIAGNOSIS — T380X5A Adverse effect of glucocorticoids and synthetic analogues, initial encounter: Secondary | ICD-10-CM | POA: Diagnosis present

## 2024-04-03 DIAGNOSIS — R569 Unspecified convulsions: Principal | ICD-10-CM | POA: Diagnosis present

## 2024-04-03 DIAGNOSIS — R0603 Acute respiratory distress: Secondary | ICD-10-CM | POA: Diagnosis not present

## 2024-04-03 DIAGNOSIS — K567 Ileus, unspecified: Secondary | ICD-10-CM | POA: Diagnosis present

## 2024-04-03 DIAGNOSIS — Z515 Encounter for palliative care: Secondary | ICD-10-CM | POA: Diagnosis not present

## 2024-04-03 DIAGNOSIS — Z6830 Body mass index (BMI) 30.0-30.9, adult: Secondary | ICD-10-CM

## 2024-04-03 DIAGNOSIS — I6930 Unspecified sequelae of cerebral infarction: Secondary | ICD-10-CM | POA: Diagnosis not present

## 2024-04-03 DIAGNOSIS — E876 Hypokalemia: Secondary | ICD-10-CM | POA: Diagnosis present

## 2024-04-03 DIAGNOSIS — M5126 Other intervertebral disc displacement, lumbar region: Secondary | ICD-10-CM | POA: Diagnosis present

## 2024-04-03 DIAGNOSIS — R29702 NIHSS score 2: Secondary | ICD-10-CM | POA: Diagnosis present

## 2024-04-03 DIAGNOSIS — G9340 Encephalopathy, unspecified: Secondary | ICD-10-CM | POA: Diagnosis not present

## 2024-04-03 DIAGNOSIS — N182 Chronic kidney disease, stage 2 (mild): Secondary | ICD-10-CM | POA: Diagnosis present

## 2024-04-03 DIAGNOSIS — I6389 Other cerebral infarction: Secondary | ICD-10-CM | POA: Diagnosis not present

## 2024-04-03 DIAGNOSIS — I6350 Cerebral infarction due to unspecified occlusion or stenosis of unspecified cerebral artery: Secondary | ICD-10-CM | POA: Diagnosis not present

## 2024-04-03 LAB — TROPONIN I (HIGH SENSITIVITY)
Troponin I (High Sensitivity): 146 ng/L (ref <18)
Troponin I (High Sensitivity): 159 ng/L (ref <18)
Troponin I (High Sensitivity): 169 ng/L (ref <18)
Troponin I (High Sensitivity): 150 ng/L (ref <18)

## 2024-04-03 LAB — CBC
HCT: 43.7 % (ref 39.0–52.0)
Hemoglobin: 15.8 g/dL (ref 13.0–17.0)
MCH: 31 pg (ref 26.0–34.0)
MCHC: 36.2 g/dL — ABNORMAL HIGH (ref 30.0–36.0)
MCV: 85.7 fL (ref 80.0–100.0)
Platelets: 164 K/uL (ref 150–400)
RBC: 5.1 MIL/uL (ref 4.22–5.81)
RDW: 12.3 % (ref 11.5–15.5)
WBC: 11.2 K/uL — ABNORMAL HIGH (ref 4.0–10.5)
nRBC: 0 % (ref 0.0–0.2)

## 2024-04-03 LAB — BASIC METABOLIC PANEL WITH GFR
Anion gap: 16 — ABNORMAL HIGH (ref 5–15)
BUN: 14 mg/dL (ref 8–23)
CO2: 20 mmol/L — ABNORMAL LOW (ref 22–32)
Calcium: 9.2 mg/dL (ref 8.9–10.3)
Chloride: 101 mmol/L (ref 98–111)
Creatinine, Ser: 1.18 mg/dL (ref 0.61–1.24)
GFR, Estimated: >60 mL/min (ref >60)
Glucose, Bld: 193 mg/dL — ABNORMAL HIGH (ref 70–99)
Potassium: 3.1 mmol/L — ABNORMAL LOW (ref 3.5–5.1)
Sodium: 137 mmol/L (ref 135–145)

## 2024-04-03 LAB — HEMOGLOBIN A1C
Hgb A1c MFr Bld: 6.1 % — ABNORMAL HIGH (ref 4.8–5.6)
Mean Plasma Glucose: 128.37 mg/dL

## 2024-04-03 LAB — D-DIMER, QUANTITATIVE: D-Dimer, Quant: 3.94 ug{FEU}/mL — ABNORMAL HIGH (ref 0.00–0.50)

## 2024-04-03 LAB — CBG MONITORING, ED: Glucose-Capillary: 199 mg/dL — ABNORMAL HIGH (ref 70–99)

## 2024-04-03 LAB — LIPID PANEL
Cholesterol: 159 mg/dL (ref 0–200)
HDL: 31 mg/dL — ABNORMAL LOW (ref >40)
LDL Cholesterol: 108 mg/dL — ABNORMAL HIGH (ref 0–99)
Total CHOL/HDL Ratio: 5.1 ratio
Triglycerides: 100 mg/dL (ref <150)
VLDL: 20 mg/dL (ref 0–40)

## 2024-04-03 LAB — MAGNESIUM: Magnesium: 1.7 mg/dL (ref 1.7–2.4)

## 2024-04-03 MED ORDER — FENOFIBRATE 54 MG PO TABS
54.0000 mg | ORAL_TABLET | Freq: Every day | ORAL | Status: DC
Start: 2024-04-04 — End: 2024-04-09
  Administered 2024-04-04 – 2024-04-08 (×5): 54 mg via ORAL
  Filled 2024-04-03 (×6): qty 1

## 2024-04-03 MED ORDER — PANTOPRAZOLE SODIUM 40 MG PO TBEC
40.0000 mg | DELAYED_RELEASE_TABLET | Freq: Every day | ORAL | Status: DC
Start: 2024-04-04 — End: 2024-04-09
  Administered 2024-04-04 – 2024-04-08 (×5): 40 mg via ORAL
  Filled 2024-04-03 (×5): qty 1

## 2024-04-03 MED ORDER — POTASSIUM CHLORIDE CRYS ER 20 MEQ PO TBCR
60.0000 meq | EXTENDED_RELEASE_TABLET | Freq: Once | ORAL | Status: AC
  Administered 2024-04-03: 60 meq via ORAL
  Filled 2024-04-03: qty 3

## 2024-04-03 MED ORDER — SODIUM CHLORIDE 0.9 % IV SOLN
500.0000 mg | Freq: Once | INTRAVENOUS | Status: AC
  Administered 2024-04-03: 500 mg via INTRAVENOUS
  Filled 2024-04-03: qty 5

## 2024-04-03 MED ORDER — GADOBUTROL 1 MMOL/ML IV SOLN
9.2000 mL | Freq: Once | INTRAVENOUS | Status: AC | PRN
  Administered 2024-04-03: 9.2 mL via INTRAVENOUS

## 2024-04-03 MED ORDER — ROSUVASTATIN CALCIUM 20 MG PO TABS
40.0000 mg | ORAL_TABLET | Freq: Every day | ORAL | Status: DC
  Administered 2024-04-04 – 2024-04-08 (×5): 40 mg via ORAL
  Filled 2024-04-03 (×5): qty 2

## 2024-04-03 MED ORDER — ACETAMINOPHEN 500 MG PO TABS
1000.0000 mg | ORAL_TABLET | Freq: Once | ORAL | Status: AC
  Administered 2024-04-03: 1000 mg via ORAL
  Filled 2024-04-03: qty 2

## 2024-04-03 MED ORDER — SODIUM CHLORIDE 0.9 % IV SOLN
1.0000 g | Freq: Once | INTRAVENOUS | Status: AC
  Administered 2024-04-03: 1 g via INTRAVENOUS
  Filled 2024-04-03: qty 10

## 2024-04-03 MED ORDER — IOHEXOL 350 MG/ML SOLN
75.0000 mL | Freq: Once | INTRAVENOUS | Status: AC | PRN
Start: 2024-04-03 — End: 2024-04-03
  Administered 2024-04-03: 75 mL via INTRAVENOUS

## 2024-04-03 MED ORDER — ENZALUTAMIDE 40 MG PO CAPS: 160.0000 mg | ORAL_CAPSULE | Freq: Every evening | ORAL | Status: DC

## 2024-04-03 MED ORDER — ASPIRIN 81 MG PO TBEC
81.0000 mg | DELAYED_RELEASE_TABLET | Freq: Every day | ORAL | Status: DC
  Administered 2024-04-03 – 2024-04-08 (×6): 81 mg via ORAL
  Filled 2024-04-03 (×6): qty 1

## 2024-04-03 MED ORDER — ONDANSETRON HCL 4 MG/2ML IJ SOLN
4.0000 mg | Freq: Once | INTRAMUSCULAR | Status: AC
  Administered 2024-04-03: 4 mg via INTRAVENOUS
  Filled 2024-04-03: qty 2

## 2024-04-03 MED ORDER — ENOXAPARIN SODIUM 40 MG/0.4ML IJ SOSY
40.0000 mg | PREFILLED_SYRINGE | INTRAMUSCULAR | Status: DC
  Administered 2024-04-04 – 2024-04-08 (×5): 40 mg via SUBCUTANEOUS
  Filled 2024-04-03 (×5): qty 0.4

## 2024-04-03 MED ORDER — DULOXETINE HCL 60 MG PO CPEP
60.0000 mg | ORAL_CAPSULE | Freq: Every day | ORAL | Status: DC
Start: 2024-04-04 — End: 2024-04-08
  Administered 2024-04-04 – 2024-04-08 (×5): 60 mg via ORAL
  Filled 2024-04-03 (×5): qty 1

## 2024-04-03 NOTE — ED Notes (Signed)
 Pt brief changed, pt attempted to use the urinal

## 2024-04-03 NOTE — ED Notes (Signed)
Pt eating meal provided

## 2024-04-03 NOTE — H&P (Signed)
 History and Physical    Jerome Jerome FMW:[id number] DOB: [birth year] DOA: 04/03/2024  DOS: the patient was seen and examined on 04/03/2024  PCP: Jerome Jerome, Jerome Jerome   Patient coming from: Home  I have personally briefly reviewed patient's old medical records in [facility] and CareEverywhere  HPI:  No notes on file   Jerome Jerome is a 73 y.o. year old male with past medical history of hypertension, hyperlipidemia, recurrent prostate cancer, and T2DM presenting to the ED after a first-time seizure this a.m. on 04/03/2024.  Patient's wife discovered the patient on the floor and witnessed a seizure.  Patient appeared to be confused afterwards with some weakness noted as well.  Patient has a history of CVA and follows with neurology outpatient for this purpose.  Patient's PCP is Jerome Jerome, Jerome Jerome with [facility].  Last visit was earlier this month.  Patient also follows with hematology oncology for recurrent prostate cancer and is undergoing treatment for that.  ED Course: Patient brought to the ED via EMS.  Initial lab work showed elevated troponin and D-dimer were CTA was obtained that did not show PE.  CTA showed some concern for an infection or possibly aspiration so EDP started patient on empiric antibiotic coverage for pneumonia.  Neurology was consulted who recommended getting an MRI but wanted to pursue further workup outpatient if the MRI was negative.  The CT head that was obtained without contrast was negative for any acute intracranial abnormalities.  TRH was consulted for admission.  TRH contacted for admission.  Review of Systems: As mentioned in the history of present illness. All other systems reviewed and are negative.    Past Medical History:  Diagnosis Date   Cancer [facility])    prostate   CKD (chronic kidney disease) stage 2, GFR 60-89 ml/min 07/28/2022   HLD (hyperlipidemia)    Hypertension     Past Surgical History:  Procedure  Laterality Date   APPENDECTOMY     CHOLECYSTECTOMY     HERNIA REPAIR     PROSTATE SURGERY     prostate cancer 2014     reports that he has never smoked. He has never used smokeless tobacco. He reports that he does not drink alcohol and does not use drugs.  Allergies  Allergen Reactions   Pravachol [Pravastatin] Other (See Comments)    Myalgias     Family History  Problem Relation Age of Onset   Stroke Neg Hx     Prior to Admission medications   Medication Sig Start Date End Date Taking? Authorizing Provider  aspirin  EC 81 MG tablet Take 1 tablet (81 mg total) by mouth daily. Swallow whole. 07/29/22  Yes Paige, Victoria J, DO  enzalutamide  (XTANDI ) 40 MG capsule Take 160 mg by mouth every evening.   Yes [provider]  amLODipine  (NORVASC ) 10 MG tablet Take 10 mg by mouth daily.    [provider]  APPLE CIDER VINEGAR PO Take 1 tablet by mouth daily.    [provider]  bacitracin  ointment Apply 1 Application topically 2 (two) times daily. Please apply to the right cheek skin tear. 02/05/24   Robinson, Jerome Jerome  calcium  carbonate (SUPER CALCIUM ) 1500 (600 Ca) MG TABS tablet Take 1 tablet by mouth daily. 04/27/21   [provider]  Cholecalciferol  (VITAMIN D -3 PO) Take 1 capsule by mouth daily.    [provider]  Cyanocobalamin  (VITAMIN B-12 PO) Take 1 tablet by mouth daily.  [provider]  DULoxetine  (CYMBALTA ) 60 MG capsule Take 60 mg by mouth daily. 02/18/24   [provider]  ELDERBERRY PO Take 1 capsule by mouth daily.    [provider]  fenofibrate  (TRICOR ) 48 MG tablet Take 48 mg by mouth daily. 11/01/21   [provider]  finasteride (PROSCAR) 5 MG tablet Take 5 mg by mouth daily. 01/18/17   [provider]  HYDROcodone -acetaminophen  (NORCO/VICODIN) 5-325 MG tablet Take 1 tablet by mouth every 4 (four) hours as needed. 02/05/24   Robinson, Jerome Jerome  icosapent Ethyl (VASCEPA) 1  g capsule Take 1 g by mouth 2 (two) times daily.    [provider]  Magnesium 400 MG TABS Take 400 mg by mouth daily.    [provider]  metoprolol  succinate (TOPROL -XL) 50 MG 24 hr tablet Take 75 mg by mouth daily. Take with or immediately following a meal.    [provider]  olmesartan (BENICAR) 40 MG tablet Take 40 mg by mouth daily.    [provider]  omeprazole (PRILOSEC) 20 MG capsule Take 20 mg by mouth daily.    [provider]  rosuvastatin  (CRESTOR ) 20 MG tablet Take 1 tablet (20 mg total) by mouth daily. 07/29/22   Paige, Victoria J, DO  spironolactone  (ALDACTONE ) 50 MG tablet Take 25 mg by mouth daily.    [provider]  tamsulosin  (FLOMAX ) 0.4 MG CAPS capsule Take 0.4 mg by mouth every evening.    [provider]  topiramate  (TOPAMAX ) 25 MG tablet Take 1 tablet (25 mg total) by mouth 2 (two) times daily. 03/26/23 03/25/24  Sherryl Bouchard, NP    Physical Exam: Vitals:   04/03/24 1145 04/03/24 1215 04/03/24 1256 04/03/24 1515  BP: (!) 178/110 (!) 176/90  134/82  Pulse: (!) 103 96  100  Resp: (!) 21 12  17   Temp:   98.2 F (36.8 C)   TempSrc:   Oral   SpO2: 99% 99%  95%  Weight:      Height:        Physical Exam: Gen: NAD HENT: tongue with laceration CV: normal heart sounds, good pulses Resp: normal lungs sounds, no rales or wheeze heard Abd:no TTP MSK: good bulk Neuro: alert and oriented, diffusely weak 4/5, no focal defciits Psych: normal mood   Labs on Admission: I have personally reviewed following labs and imaging studies  CBC: Recent Labs  Lab 04/03/24 0901  WBC 11.2*  HGB 15.8  HCT 43.7  MCV 85.7  PLT 164   Basic Metabolic Panel: Recent Labs  Lab 04/03/24 0901 04/03/24 1518  NA 137  --   K 3.1*  --   CL 101  --   CO2 20*  --   GLUCOSE 193*  --   BUN 14  --   CREATININE 1.18  --   CALCIUM  9.2  --   MG  --  1.7   GFR: Estimated Creatinine Clearance: 60.5 mL/min (by C-G  formula based on SCr of 1.18 mg/dL). Liver Function Tests: No results for input(s): AST, ALT, ALKPHOS, BILITOT, PROT, ALBUMIN in the last 168 hours. No results for input(s): LIPASE, AMYLASE in the last 168 hours. No results for input(s): AMMONIA in the last 168 hours. Coagulation Profile: No results for input(s): INR, PROTIME in the last 168 hours. Cardiac Enzymes: Recent Labs  Lab 04/03/24 0901 04/03/24 1108 04/03/24 1518  TROPONINIHS 146* 159* 169*   BNP (last 3 results) No results for input(s):  BNP in the last 8760 hours. HbA1C: No results for input(s): HGBA1C in the last 72 hours. CBG: Recent Labs  Lab 04/03/24 0856  GLUCAP 199*   Lipid Profile: No results for input(s): CHOL, HDL, LDLCALC, TRIG, CHOLHDL, LDLDIRECT in the last 72 hours. Thyroid  Function Tests: No results for input(s): TSH, T4TOTAL, FREET4, T3FREE, THYROIDAB in the last 72 hours. Anemia Panel: No results for input(s): VITAMINB12, FOLATE, FERRITIN, TIBC, IRON, RETICCTPCT in the last 72 hours. Urine analysis:    Component Value Date/Time   COLORURINE AMBER (A) 07/28/2022 1223   APPEARANCEUR HAZY (A) 07/28/2022 1223   LABSPEC 1.019 07/28/2022 1223   PHURINE 5.0 07/28/2022 1223   GLUCOSEU 50 (A) 07/28/2022 1223   HGBUR NEGATIVE 07/28/2022 1223   BILIRUBINUR NEGATIVE 07/28/2022 1223   KETONESUR NEGATIVE 07/28/2022 1223   PROTEINUR >=300 (A) 07/28/2022 1223   NITRITE NEGATIVE 07/28/2022 1223   LEUKOCYTESUR NEGATIVE 07/28/2022 1223    Radiological Exams on Admission: I have personally reviewed images CT Angio Chest PE W and/or Wo Contrast Result Date: 04/03/2024 CLINICAL DATA:  Positive D-dimer. Seizures. Low to intermediate probability for pulmonary embolism. EXAM: CT ANGIOGRAPHY CHEST WITH CONTRAST TECHNIQUE: Multidetector CT imaging of the chest was performed using the standard protocol during bolus administration of intravenous contrast.  Multiplanar CT image reconstructions and MIPs were obtained to evaluate the vascular anatomy. RADIATION DOSE REDUCTION: This exam was performed according to the departmental dose-optimization program which includes automated exposure control, adjustment of the mA and/or kV according to patient size and/or use of iterative reconstruction technique. CONTRAST:  75mL OMNIPAQUE  IOHEXOL  350 MG/ML SOLN COMPARISON:  Chest CT 02/25/2008 and abdominal CT 10/15/2023 FINDINGS: Cardiovascular: Borderline cardiomegaly. Mild calcified plaque over the left anterior descending coronary artery. Thoracic aorta measures 3.8 cm in AP diameter. Thoracic aorta is otherwise unremarkable. Adequate opacification of the pulmonary arterial system without evidence of pulmonary embolism. Remaining vascular structures are unremarkable. Mediastinum/Nodes: Mediastinum demonstrates no evidence of adenopathy. No hilar adenopathy. Remaining mediastinal structures are unremarkable. Lungs/Pleura: Lungs are adequately inflated. There is significant artifact from metallic device over the posterior right thorax just right of midline. Possible mild opacification over the superior segment right lower lobe which could be due to infection versus atelectasis, although may be due to artifact from the adjacent metallic device over the posterior thorax. Remainder of the right lung is clear. Left lung is clear. Airways are unremarkable. Upper Abdomen: No acute findings over the visualized upper abdominal images. Musculoskeletal: No focal abnormality. Review of the MIP images confirms the above findings. IMPRESSION: 1. No evidence of pulmonary embolism. 2. Possible mild opacification over the superior segment right lower lobe which could be due to infection versus atelectasis, although may be due to artifact from the adjacent metallic device over the posterior thorax. Consider follow-up noncontrast chest CT without metallic device present for further evaluation  versus Jerome Jerome and lateral chest radiograph. 3. Borderline cardiomegaly. Mild atherosclerotic coronary artery disease. 4. Aortic atherosclerosis. Aortic Atherosclerosis (ICD10-I70.0). Electronically Signed   By: Toribio Agreste M.D.   On: 04/03/2024 12:40   DG Chest Port 1 View Result Date: 04/03/2024 CLINICAL DATA:  Shortness of breath EXAM: PORTABLE CHEST 1 VIEW COMPARISON:  02/25/2008 portable chest and chest CTA. FINDINGS: Poor inspiration. Normal-sized heart. Tortuous and partially calcified thoracic aorta. Clear lungs with normal vascularity. Mildly elevated left hemidiaphragm. Mild lower thoracic spine degenerative changes. IMPRESSION: No acute abnormality. Electronically Signed   By: Elspeth Bathe M.D.   On: 04/03/2024 10:02   CT Head  Wo Contrast Result Date: 04/03/2024 EXAM: CT HEAD WITHOUT CONTRAST 04/03/2024 09:19:00 AM TECHNIQUE: CT of the head was performed without the administration of intravenous contrast. Automated exposure control, iterative reconstruction, and/or weight based adjustment of the mA/kV was utilized to reduce the radiation dose to as low as reasonably achievable. COMPARISON: CT of the head dated 02/05/2024. CLINICAL HISTORY: Mental status change, unknown cause. No contrast. BIB EMS hx of stroke with right side deficit. Also being treated for prostate cancer. Wife heard a moan and checked on him and he was hving seizure like activity that last 90sec. No seizure hx. Trauma to tongue no incontinence. Normally ; alert and oriented. Postictal at this time but slowly coming around. 200/130 Runs of bigemny. CBG 178 20g L wrist. FINDINGS: BRAIN AND VENTRICLES: No acute hemorrhage. No evidence of acute infarct. No hydrocephalus. No extra-axial collection. No mass effect or midline shift. There is age-related atrophy and mild-to-moderate periventricular white matter disease. ORBITS: No acute abnormality. SINUSES: No acute abnormality. SOFT TISSUES AND SKULL: No acute soft tissue abnormality. No  skull fracture. IMPRESSION: 1. No acute intracranial abnormality. 2. Age-related cerebral atrophy and mild-to-moderate periventricular white matter disease. Electronically signed by: Evalene Coho MD 04/03/2024 09:32 AM EDT RP Workstation: [id number]    EKG: My personal interpretation of EKG shows: Sinus tachycardia without any ST-T wave changes but shows abnormal/late R wave progression   Assessment/Plan Principal Problem:   Seizures (HCC)   Seizure: Patient presents after first-time seizure.  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  Neurology consulted, appreciate their recommendations.  Exact etiology of the seizure is not known yet appreciate nephrology's assistance with this.  It appears patient's head trauma could be an etiology.  Spot EEG ordered but per neurology will defer long-term EEG monitoring as it is not indicated.  Acute CVA: Noted on MRI.  Given patient with previous history of left thalamic stroke, exam is unchanged.  Neurology on board.  Will further workup based on the recommendations.  Resume high intensity statin.  Will get lipid panel and A1c and recent results for these.  PT/OT and speech ordered.  4 mm dural lesion: Consulted neurology, appreciate recommendation.  Hypertension: Allow permissive hypertension and hold home antihypertensives.  Prostate cancer: Follows with urology and oncology.  On Xtandi  and leuprolide every 3 months.  Will restart home Xtandi .  Concern for pulmonary infection: Patient started on ceftriaxone  and azithromycin  in the ED.  But I suspect this is aspiration pneumonitis from the seizure and less likely an infection given patient is afebrile and satting well on room air.  I think this can be safely discontinued.  GERD: Chronic.  Resume home [facility].    VTE prophylaxis:  Lovenox   Diet: NPO Code Status:   Full Code HCDM: Telemetry:  Admission status: Observation, Med-Surg Patient is from: Home  Anticipated d/c is to: Home  Anticipated d/c date is: 1-2 days    Family Communication: Family updated at bedside   Consults called: Neurology    Severity of Illness: The appropriate patient status for this patient is OBSERVATION. Observation status is judged to be reasonable and necessary in order to provide the required intensity of service to ensure the patient's safety. The patient's presenting symptoms, physical exam findings, and initial radiographic and laboratory data in the context of their medical condition is felt to place them at decreased risk for further clinical  deterioration. Furthermore, it is anticipated that the patient will be medically stable for discharge from the hospital within 2 midnights of admission.    Morene Bathe, MD [facility]

## 2024-04-03 NOTE — ED Notes (Signed)
 Pt bedding and brief changed as patient had urinated on himself during CT

## 2024-04-03 NOTE — ED Provider Notes (Signed)
 [place] EMERGENCY DEPARTMENT AT [facility] Provider Note   CSN: [id number] Arrival date & time: 04/03/24  9156     Patient presents with: Seizures   Jerome Cardenas is a 73 y.o. male.   73 year old male with past medical history of metastatic prostate cancer, CKD, and hypertension presenting to the emergency department today after a seizure this morning.  The patient's last known normal was last night around 1230 when his wife helped him up to go to the bathroom.  She reports that she heard a moaning sound per medics and when she went to check in on him he was having a seizure.  The patient did bite his tongue.  Denies a history of seizures in the past.  He was reportedly in his normal state of health until then.  He was brought to the emergency department at that time.  The patient does have some mild confusion now.  He does have a history of remote CVA with right-sided deficits.  His strength is at his baseline per medics.   Seizures      Prior to Admission medications   Medication Sig Start Date End Date Taking? Authorizing Provider  APPLE CIDER VINEGAR PO Take 1 tablet by mouth daily.   Yes [provider]  Ascorbic Acid (VITAMIN C) 500 MG CHEW Chew 500 mg by mouth daily.   Yes [provider]  calcium  carbonate (SUPER CALCIUM ) 1500 (600 Ca) MG TABS tablet Take 1 tablet by mouth daily. 04/27/21  Yes [provider]  Cholecalciferol  (VITAMIN D -3 PO) Take 1 capsule by mouth daily.   Yes [provider]  Cyanocobalamin  (VITAMIN B-12 PO) Take 1 tablet by mouth daily.   Yes [provider]  DULoxetine  (CYMBALTA ) 60 MG capsule Take 60 mg by mouth daily. 02/18/24  Yes [provider]  ELDERBERRY PO Take 1 capsule by mouth daily.   Yes [provider]  enzalutamide  (XTANDI ) 40 MG capsule Take 160 mg by mouth every evening.   Yes [provider]  icosapent Ethyl (VASCEPA) 1 g capsule Take 2 g by mouth 2 (two)  times daily.   Yes [provider]  Magnesium 400 MG TABS Take 400 mg by mouth daily.   Yes [provider]  metFORMIN  (GLUCOPHAGE ) 500 MG tablet Take 500 mg by mouth daily with breakfast.   Yes [provider]  metoprolol  succinate (TOPROL -XL) 50 MG 24 hr tablet Take 50 mg by mouth daily. Take with or immediately following a meal.   Yes [provider]  Multiple Vitamin (MULTIVITAMIN WITH MINERALS) TABS tablet Take 1 tablet by mouth daily.   Yes [provider]  omeprazole (PRILOSEC) 20 MG capsule Take 20 mg by mouth daily.   Yes [provider]  rosuvastatin  (CRESTOR ) 20 MG tablet Take 1 tablet (20 mg total) by mouth daily. 07/29/22  Yes Paige, Victoria J, DO  spironolactone  (ALDACTONE ) 50 MG tablet Take 50 mg by mouth daily.   Yes [provider]  amLODipine  (NORVASC ) 10 MG tablet Take 10 mg by mouth daily. Patient not taking: Reported on 04/05/2024    [provider]  aspirin  EC 81 MG tablet Take 1 tablet (81 mg total) by mouth daily. Swallow whole. Patient not taking: Reported on 04/05/2024 07/29/22   Paige, Victoria J, DO  bacitracin  ointment Apply 1 Application topically 2 (two) times daily. Please apply to the right cheek skin tear. Patient not taking: Reported on 04/04/2024 02/05/24   Lang Rush  K, PA-C  fenofibrate  (TRICOR ) 48 MG tablet Take 48 mg by mouth daily. Patient not taking: Reported on 04/05/2024 11/01/21   [provider]  HYDROcodone -acetaminophen  (NORCO/VICODIN) 5-325 MG tablet Take 1 tablet by mouth every 4 (four) hours as needed. Patient not taking: Reported on 04/04/2024 02/05/24   Lang Norleen POUR, PA-C  olmesartan (BENICAR) 40 MG tablet Take 40 mg by mouth daily. Patient not taking: Reported on 04/05/2024    [provider]  topiramate  (TOPAMAX ) 25 MG tablet Take 1 tablet (25 mg total) by mouth 2 (two) times daily. Patient not taking: Reported on 04/04/2024 03/26/23 03/25/24  Millikan,  Megan, NP    Allergies: Pravachol [pravastatin]    Review of Systems  Neurological:  Positive for seizures.  All other systems reviewed and are negative.   Updated Vital Signs BP (!) 154/87 (BP Location: Right Arm)   Pulse 79   Temp 98.7 F (37.1 C) (Oral)   Resp 19   Ht 5' 8 (1.727 m)   Wt 92.1 kg   SpO2 97%   BMI 30.87 kg/m   Physical Exam Vitals and nursing note reviewed.   Gen: NAD Eyes: PERRL, EOMI HEENT: no oropharyngeal swelling, abrasion to tongue noted Neck: trachea midline, no meningismus Resp: clear to auscultation bilaterally Card: RRR, no murmurs, rubs, or gallops Abd: nontender, nondistended Extremities: no calf tenderness, no edema Vascular: 2+ radial pulses bilaterally, 2+ DP pulses bilaterally Neuro: Cranial nerves intact, the patient does have 4-5 strength in the right upper and lower extremity but no drift Skin: no rashes   (all labs ordered are listed, but only abnormal results are displayed) Labs Reviewed  BASIC METABOLIC PANEL WITH GFR - Abnormal; Notable for the following components:      Result Value   Potassium 3.1 (*)    CO2 20 (*)    Glucose, Bld 193 (*)    Anion gap 16 (*)    All other components within normal limits  CBC - Abnormal; Notable for the following components:   WBC 11.2 (*)    MCHC 36.2 (*)    All other components within normal limits  D-DIMER, QUANTITATIVE - Abnormal; Notable for the following components:   D-Dimer, Quant 3.94 (*)    All other components within normal limits  LIPID PANEL - Abnormal; Notable for the following components:   HDL 31 (*)    LDL Cholesterol 108 (*)    All other components within normal limits  HEMOGLOBIN A1C - Abnormal; Notable for the following components:   Hgb A1c MFr Bld 6.1 (*)    All other components within normal limits  BASIC METABOLIC PANEL WITH GFR - Abnormal; Notable for the following components:   Potassium 3.1 (*)    CO2 19 (*)    Glucose, Bld 174 (*)    Calcium  7.3 (*)     All other components within normal limits  URINALYSIS, COMPLETE (UACMP) WITH MICROSCOPIC - Abnormal; Notable for the following components:   Specific Gravity, Urine 1.045 (*)    Hgb urine dipstick SMALL (*)    Protein, ur 30 (*)    All other components within normal limits  CBC - Abnormal; Notable for the following components:   MCHC 36.4 (*)    Platelets 148 (*)    All other components within normal limits  BASIC METABOLIC PANEL WITH GFR - Abnormal; Notable for the following components:   CO2 21 (*)    Glucose, Bld 193 (*)    Calcium  8.8 (*)  All other components within normal limits  GLUCOSE, CAPILLARY - Abnormal; Notable for the following components:   Glucose-Capillary 226 (*)    All other components within normal limits  GLUCOSE, CAPILLARY - Abnormal; Notable for the following components:   Glucose-Capillary 145 (*)    All other components within normal limits  GLUCOSE, CAPILLARY - Abnormal; Notable for the following components:   Glucose-Capillary 172 (*)    All other components within normal limits  CBG MONITORING, ED - Abnormal; Notable for the following components:   Glucose-Capillary 199 (*)    All other components within normal limits  TROPONIN I (HIGH SENSITIVITY) - Abnormal; Notable for the following components:   Troponin I (High Sensitivity) 146 (*)    All other components within normal limits  TROPONIN I (HIGH SENSITIVITY) - Abnormal; Notable for the following components:   Troponin I (High Sensitivity) 159 (*)    All other components within normal limits  TROPONIN I (HIGH SENSITIVITY) - Abnormal; Notable for the following components:   Troponin I (High Sensitivity) 169 (*)    All other components within normal limits  TROPONIN I (HIGH SENSITIVITY) - Abnormal; Notable for the following components:   Troponin I (High Sensitivity) 150 (*)    All other components within normal limits  CULTURE, BLOOD (ROUTINE X 2)  CULTURE, BLOOD (ROUTINE X 2)  MAGNESIUM   URINALYSIS, ROUTINE W REFLEX MICROSCOPIC    EKG: EKG Interpretation Date/Time:  Friday April 03 2024 08:51:21 EDT Ventricular Rate:  103 PR Interval:  192 QRS Duration:  96 QT Interval:  348 QTC Calculation: 456 R Axis:   -6  Text Interpretation: Sinus tachycardia Abnormal R-wave progression, late transition Borderline repolarization abnormality Confirmed by Ula Barter [phone]) on 04/03/2024 8:59:03 AM  Radiology: EEG adult Result Date: 04/04/2024 Matthews Elida HERO, MD     04/04/2024  3:49 PM Routine EEG Report Jerome Cardenas is a 73 y.o. male with a history of seizure who is undergoing an EEG to evaluate for seizures. Report: This EEG was acquired with electrodes placed according to the International 10-20 electrode system (including Fp1, Fp2, F3, F4, C3, C4, P3, P4, O1, O2, T3, T4, T5, T6, A1, A2, Fz, Cz, Pz). The following electrodes were missing or displaced: none. The occipital dominant rhythm was 9 Hz. This activity is reactive to stimulation. Drowsiness was manifested by background fragmentation; deeper stages of sleep were identified by K complexes and sleep spindles. There was no focal slowing. There were no interictal epileptiform discharges. There were no electrographic seizures identified. Impression: This EEG was obtained while awake and asleep and is normal.   Clinical Correlation: Normal EEGs, however, do not rule out epilepsy. Elida Matthews, MD [facility] [phone] If 7pm- 7am, please page neurology on call as listed in AMION.   ECHOCARDIOGRAM COMPLETE Result Date: 04/04/2024    ECHOCARDIOGRAM REPORT   Patient Name:   Jerome Cardenas [facility] Date of Exam: 04/04/2024 Medical Rec #:  [id number]       Height:       68.0 in Accession #:    [id number]      Weight:       203.0 lb Date of Birth:  [birth year]       BSA:          2.057 m Patient Age:    73 years        BP:           138/101 mmHg Patient Gender: M  HR:           100 bpm. Exam Location:  Inpatient  Procedure: 2D Echo, Color Doppler, Cardiac Doppler and 3D Echo (Both Spectral            and Color Flow Doppler were utilized during procedure). Indications:    Elevated Troponin  History:        Patient has prior history of Echocardiogram examinations, most                 recent 07/29/2022. Risk Factors:Hypertension and Diabetes.                 Cancer, Chronic Kidney Disease.  Sonographer:    Logan Shove RDCS Referring Phys: [id number] [facility] IMPRESSIONS  1. Left ventricular ejection fraction, by estimation, is 60 to 65%. The left ventricle has normal function. The left ventricle has no regional wall motion abnormalities. Left ventricular diastolic parameters are consistent with Grade I diastolic dysfunction (impaired relaxation).  2. Right ventricular systolic function is normal. The right ventricular size is normal.  3. The mitral valve is normal in structure. No evidence of mitral valve regurgitation. No evidence of mitral stenosis.  4. The aortic valve is tricuspid. Aortic valve regurgitation is trivial. No aortic stenosis is present.  5. The inferior vena cava is normal in size with greater than 50% respiratory variability, suggesting right atrial pressure of 3 mmHg. FINDINGS  Left Ventricle: Left ventricular ejection fraction, by estimation, is 60 to 65%. The left ventricle has normal function. The left ventricle has no regional wall motion abnormalities. The left ventricular internal cavity size was normal in size. There is  no left ventricular hypertrophy. Left ventricular diastolic parameters are consistent with Grade I diastolic dysfunction (impaired relaxation). Right Ventricle: The right ventricular size is normal. Right ventricular systolic function is normal. Left Atrium: Left atrial size was normal in size. Right Atrium: Right atrial size was normal in size. Pericardium: There is no evidence of pericardial effusion. Mitral Valve: The mitral valve is normal in structure. No evidence of mitral valve  regurgitation. No evidence of mitral valve stenosis. Tricuspid Valve: The tricuspid valve is normal in structure. Tricuspid valve regurgitation is trivial. No evidence of tricuspid stenosis. Aortic Valve: The aortic valve is tricuspid. Aortic valve regurgitation is trivial. No aortic stenosis is present. Aortic valve mean gradient measures 4.5 mmHg. Aortic valve peak gradient measures 9.3 mmHg. Aortic valve area, by VTI measures 1.91 cm. Pulmonic Valve: The pulmonic valve was normal in structure. Pulmonic valve regurgitation is not visualized. No evidence of pulmonic stenosis. Aorta: The aortic root is normal in size and structure. Venous: The inferior vena cava is normal in size with greater than 50% respiratory variability, suggesting right atrial pressure of 3 mmHg. IAS/Shunts: No atrial level shunt detected by color flow Doppler. Additional Comments: 3D was performed not requiring image post processing on an independent workstation and was normal.  LEFT VENTRICLE PLAX 2D LVIDd:         4.70 cm LVIDs:         3.30 cm LV PW:         1.00 cm LV IVS:        0.80 cm LVOT diam:     2.20 cm   3D Volume EF: LV SV:         54        3D EF:        56 % LV SV Index:   26  LV EDV:       118 ml LVOT Area:     3.80 cm  LV ESV:       52 ml                          LV SV:        66 ml RIGHT VENTRICLE             IVC RV Basal diam:  2.30 cm     IVC diam: 1.70 cm RV S prime:     18.42 cm/s TAPSE (M-mode): 2.0 cm LEFT ATRIUM             Index        RIGHT ATRIUM          Index LA diam:        3.40 cm 1.65 cm/m   RA Area:     9.44 cm LA Vol (A2C):   52.8 ml 25.67 ml/m  RA Volume:   16.40 ml 7.97 ml/m LA Vol (A4C):   66.7 ml 32.43 ml/m LA Biplane Vol: 63.8 ml 31.02 ml/m  AORTIC VALVE AV Area (Vmax):    1.86 cm AV Area (Vmean):   1.86 cm AV Area (VTI):     1.91 cm AV Vmax:           152.50 cm/s AV Vmean:          99.300 cm/s AV VTI:            0.280 m AV Peak Grad:      9.3 mmHg AV Mean Grad:      4.5 mmHg LVOT Vmax:          74.50 cm/s LVOT Vmean:        48.600 cm/s LVOT VTI:          0.141 m LVOT/AV VTI ratio: 0.50  AORTA Ao Root diam: 3.20 cm Ao Asc diam:  3.50 cm  SHUNTS Systemic VTI:  0.14 m Systemic Diam: 2.20 cm Redell Shallow MD Electronically signed by Redell Shallow MD Signature Date/Time: 04/04/2024/1:14:12 PM    Final    CT ANGIO HEAD NECK W WO CM Result Date: 04/04/2024 CLINICAL DATA:  73 year old male with evidence of left MCA territory lacunar infarct on MRI yesterday. Neurologic deficit, seizure. 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 EXAM: CT ANGIOGRAPHY HEAD AND NECK WITH AND WITHOUT CONTRAST TECHNIQUE: Multidetector CT imaging of the head and neck was performed using the standard protocol during bolus administration of intravenous contrast. Multiplanar CT image reconstructions and MIPs were obtained to evaluate the vascular anatomy. Carotid stenosis measurements (when applicable) are obtained utilizing NASCET criteria, using the distal internal carotid diameter as the denominator. RADIATION DOSE REDUCTION: This exam was performed according to the departmental dose-optimization program which includes automated exposure control, adjustment of the mA and/or kV according to patient size and/or use of iterative reconstruction technique. CONTRAST:  75mL OMNIPAQUE  IOHEXOL  350 MG/ML SOLN COMPARISON:  Brain MRI yesterday.  Head CT yesterday. CTA head and neck 07/29/2022. FINDINGS: CT HEAD Brain: Stable non contrast CT appearance of the brain. Chronic small vessel disease including left thalamic lacunar infarct, bilateral white matter hypodensity. Suspected left MCA territory lacune and small left posterior fossa meningioma occult by CT. No acute intracranial hemorrhage or mass effect. Calvarium and skull base: Stable and intact. Paranasal sinuses: Visualized paranasal sinuses and mastoids are stable and well aerated. Orbits: Small left forehead benign scalp lipoma.  No gaze deviation. CTA NECK  Skeleton: Chronic cervical spine degeneration. No acute osseous abnormality identified. Upper chest: Dense left subclavian and innominate venous contrast streak artifact. Otherwise negative. Other neck: Nonvascular neck soft tissue spaces appears stable from last year and within normal limits. Aortic arch: Calcified arch atherosclerosis. Three vessel arch configuration. Right carotid system: Patent with chronic brachiocephalic artery and right CCA tortuosity. Mild soft and calcified plaque at the proximal right ICA without stenosis. Left carotid system: Patent with tortuosity, mild soft plaque in the left CCA. Minimal plaque at the left carotid bifurcation. No stenosis. Vertebral arteries: Tortuous proximal right subclavian artery without stenosis. Tortuous right vertebral artery origin with perhaps mild soft plaque but no stenosis. Patent right vertebral artery to the skull base without stenosis. Proximal left subclavian artery and left vertebral artery origin appear normal. Possible mild V1 segment plaque. Mildly dominant appearance of the left vertebral artery. No stenosis to the skull base. CTA HEAD Posterior circulation: Patent distal vertebral arteries and vertebrobasilar junction. Chronically dominant appearance of the left V4 segment, although fairly normal caliber. Chronically atherosclerotic and non dominant appearance of the distal right vertebral artery which remains patent. Mild left V4 plaque also. No hemodynamically significant distal vertebral stenosis. Patent basilar artery with mild irregularity. Fetal left PCA origin. Distal basilar and right PCA origin remain patent. Bilateral PCAs are patent, and bilateral PCA irregularity is less pronounced compared to the previous CTA. Up to moderate irregularity now right PCA P1/P2 junction and bilateral P3 segments (series 14, image 21). Anterior circulation: Both ICA siphons are patent. Mildly tortuous left siphon with mild calcified plaque and no stenosis.  Normal left posterior communicating artery origin. Mild tortuosity of the right siphon. Mild to moderate right siphon calcified plaque without stenosis. Patent carotid termini. Patent MCA and ACA origins. Mildly dominant left A1. Diminutive or absent anterior communicating artery. Bilateral ACA branches are patent with increased mild to moderate distal A2 segment irregularity compared to last year (series 13, image 22). MCA M1 segments and MCA bifurcations are patent. Left MCA distal M1, bifurcation, and proximal M2 mild to moderate irregularity appears stable on series 14, image 21. Comparatively mild right MCA bifurcation irregularity. MCA branches appear stable with mild irregularity bilaterally. Venous sinuses: Early contrast timing, not evaluated. Anatomic variants: Mildly dominant left vertebral artery, left ACA A1, fetal type left PCA origin. Review of the MIP images confirms the above findings IMPRESSION: 1. Negative for large vessel occlusion. Chronic extracranial artery tortuosity but little extracranial atherosclerosis. Chronically more advanced Intracranial Atherosclerosis. Notable for: - increased ACA A2 segment irregularity and stenosis, mild-to-moderate. - stable left > right MCA segment irregularity and stenosis, mild-to-moderate. - improved appearance of bilateral PCA irregularity and stenosis, now moderate. 2. Stable non contrast CT appearance of the brain. 3.  Aortic Atherosclerosis (ICD10-I70.0). Electronically Signed   By: VEAR Hurst M.D.   On: 04/04/2024 07:43     Procedures   Medications Ordered in the ED  aspirin  EC tablet 81 mg (81 mg Oral Given 04/05/24 1106)  DULoxetine  (CYMBALTA ) DR capsule 60 mg (60 mg Oral Given 04/05/24 1106)  fenofibrate  tablet 54 mg (54 mg Oral Given 04/05/24 1104)  pantoprazole  (PROTONIX ) EC tablet 40 mg (40 mg Oral Given 04/05/24 1106)  rosuvastatin  (CRESTOR ) tablet 40 mg (40 mg Oral Given 04/05/24 1105)  enoxaparin  (LOVENOX ) injection 40 mg (40 mg Subcutaneous  Given 04/05/24 1106)  levETIRAcetam (KEPPRA) tablet 500 mg (500 mg Oral Given 04/05/24 2215)  clopidogrel  (PLAVIX ) tablet 75 mg (75  mg Oral Given 04/05/24 1106)  labetalol (NORMODYNE) injection 10 mg (10 mg Intravenous Given 04/04/24 2040)  metoprolol  succinate (TOPROL -XL) 24 hr tablet 75 mg (75 mg Oral Given 04/05/24 1104)  topiramate  (TOPAMAX ) tablet 25 mg (25 mg Oral Given 04/05/24 2215)  amLODipine  (NORVASC ) tablet 10 mg (10 mg Oral Given 04/05/24 1105)  irbesartan  (AVAPRO ) tablet 300 mg (300 mg Oral Given 04/05/24 1106)  spironolactone  (ALDACTONE ) tablet 25 mg (25 mg Oral Given 04/05/24 1106)  insulin aspart (novoLOG) injection 0-9 Units (1 Units Subcutaneous Given 04/05/24 1626)  methocarbamol (ROBAXIN) tablet 500 mg (has no administration in time range)  oxyCODONE (Oxy IR/ROXICODONE) immediate release tablet 5 mg (has no administration in time range)  acetaminophen  (TYLENOL ) tablet 650 mg (has no administration in time range)  ondansetron  (ZOFRAN ) injection 4 mg (4 mg Intravenous Given 04/03/24 0943)  iohexol  (OMNIPAQUE ) 350 MG/ML injection 75 mL (75 mLs Intravenous Contrast Given 04/03/24 1204)  acetaminophen  (TYLENOL ) tablet 1,000 mg (1,000 mg Oral Given 04/03/24 1357)  cefTRIAXone  (ROCEPHIN ) 1 g in sodium chloride  0.9 % 100 mL IVPB (0 g Intravenous Stopped 04/03/24 1441)  azithromycin  (ZITHROMAX ) 500 mg in sodium chloride  0.9 % 250 mL IVPB (0 mg Intravenous Stopped 04/03/24 1508)  potassium chloride  SA (KLOR-CON  M) CR tablet 60 mEq (60 mEq Oral Given 04/03/24 1733)  gadobutrol  (GADAVIST ) 1 MMOL/ML injection 9.2 mL (9.2 mLs Intravenous Contrast Given 04/03/24 1826)  iohexol  (OMNIPAQUE ) 350 MG/ML injection 75 mL (75 mLs Intravenous Contrast Given 04/04/24 0718)  potassium chloride  SA (KLOR-CON  M) CR tablet 60 mEq (60 mEq Oral Given 04/04/24 1139)                                    Medical Decision Making 73 year old male with past medical history of prostate cancer, CKD, and hypertension presenting  to the emergency department today after a seizure.  Will make patient code medical to obtain a stat head CT to evaluate for intracranial hemorrhage.  The patient has his old neurologic deficits but does not have symptoms consistent with LVO.  His blood pressures here are less than 220/120 so we will hold off on any acute treatment of his blood pressure currently.  Will obtain basic labs here as well as a chest x-ray and D-dimer as the patient's pulse ox is 90% on room air.  I will reevaluate for ultimate disposition.  The patient's chest x-ray shows questionable pneumonia.  He started on antibiotics.  Given this along with the seizure call was placed to hospital service for admission.  Amount and/or Complexity of Data Reviewed Labs: ordered. Radiology: ordered.  Risk OTC drugs. Prescription drug management. Decision regarding hospitalization.        Final diagnoses:  Seizure-like activity (HCC)  Lung infiltrate on CT    ED Discharge Orders          Ordered    Ambulatory referral to Neurology       Comments: Follow up with stroke clinic NP Megan at [facility] in about 4-6 weeks. Pt is her pt. Thanks.   04/05/24 1526               Ula Prentice SAUNDERS, MD 04/05/24 2240

## 2024-04-03 NOTE — ED Triage Notes (Signed)
 BIB EMS hx of stroke with right side deficit.  Also being treated for prostate cancer.  Wife heard a moan and checked on him and he was hving seizure like activity that last 90sec.  No seizure hx.  Trauma to tongue no incontinence.  Normally alert and oriented.  Postictal at this time but slowly coming around.  200/130 Runs of bigemny . CBG 178  20g L wrist

## 2024-04-03 NOTE — ED Notes (Signed)
 Pt taken to CT by this paramedic. Pt became very nauseous during transfer to CT table but did not vomit.  EDP advised.

## 2024-04-03 NOTE — ED Notes (Signed)
 CCMD called to move patient

## 2024-04-03 NOTE — ED Notes (Signed)
 Pt wet, pt brief changed, linen changed.

## 2024-04-03 NOTE — ED Notes (Signed)
 Pt requesting to eat. Dr. Fernand notified and will come see pt to place diet order.

## 2024-04-03 NOTE — ED Notes (Signed)
 Placed on 2L NC

## 2024-04-03 NOTE — ED Notes (Signed)
 Patient transported to CT

## 2024-04-04 ENCOUNTER — Inpatient Hospital Stay (HOSPITAL_COMMUNITY)

## 2024-04-04 DIAGNOSIS — R569 Unspecified convulsions: Secondary | ICD-10-CM

## 2024-04-04 DIAGNOSIS — E1151 Type 2 diabetes mellitus with diabetic peripheral angiopathy without gangrene: Secondary | ICD-10-CM | POA: Diagnosis not present

## 2024-04-04 DIAGNOSIS — I6350 Cerebral infarction due to unspecified occlusion or stenosis of unspecified cerebral artery: Secondary | ICD-10-CM | POA: Diagnosis not present

## 2024-04-04 DIAGNOSIS — I6389 Other cerebral infarction: Secondary | ICD-10-CM

## 2024-04-04 DIAGNOSIS — R7989 Other specified abnormal findings of blood chemistry: Secondary | ICD-10-CM | POA: Diagnosis not present

## 2024-04-04 DIAGNOSIS — E785 Hyperlipidemia, unspecified: Secondary | ICD-10-CM | POA: Diagnosis not present

## 2024-04-04 LAB — ECHOCARDIOGRAM COMPLETE
AR max vel: 1.86 cm2
AV Area VTI: 1.91 cm2
AV Area mean vel: 1.86 cm2
AV Mean grad: 4.5 mmHg
AV Peak grad: 9.3 mmHg
Ao pk vel: 1.53 m/s
Height: 68 in
S' Lateral: 3.3 cm
Weight: 3248 [oz_av]

## 2024-04-04 LAB — URINALYSIS, COMPLETE (UACMP) WITH MICROSCOPIC
Bacteria, UA: NONE SEEN
Bilirubin Urine: NEGATIVE
Glucose, UA: NEGATIVE mg/dL
Ketones, ur: NEGATIVE mg/dL
Leukocytes,Ua: NEGATIVE
Nitrite: NEGATIVE
Protein, ur: 30 mg/dL — AB
Specific Gravity, Urine: 1.045 — ABNORMAL HIGH (ref 1.005–1.030)
pH: 5 (ref 5.0–8.0)

## 2024-04-04 LAB — BASIC METABOLIC PANEL WITH GFR
Anion gap: 8 (ref 5–15)
BUN: 8 mg/dL (ref 8–23)
CO2: 19 mmol/L — ABNORMAL LOW (ref 22–32)
Calcium: 7.3 mg/dL — ABNORMAL LOW (ref 8.9–10.3)
Chloride: 111 mmol/L (ref 98–111)
Creatinine, Ser: 0.74 mg/dL (ref 0.61–1.24)
GFR, Estimated: >60 mL/min (ref >60)
Glucose, Bld: 174 mg/dL — ABNORMAL HIGH (ref 70–99)
Potassium: 3.1 mmol/L — ABNORMAL LOW (ref 3.5–5.1)
Sodium: 138 mmol/L (ref 135–145)

## 2024-04-04 MED ORDER — LEVETIRACETAM 500 MG PO TABS
500.0000 mg | ORAL_TABLET | Freq: Two times a day (BID) | ORAL | Status: DC
  Administered 2024-04-04 – 2024-04-08 (×10): 500 mg via ORAL
  Filled 2024-04-04 (×10): qty 1

## 2024-04-04 MED ORDER — CLOPIDOGREL BISULFATE 75 MG PO TABS
75.0000 mg | ORAL_TABLET | Freq: Every day | ORAL | Status: DC
  Administered 2024-04-04 – 2024-04-08 (×5): 75 mg via ORAL
  Filled 2024-04-04 (×5): qty 1

## 2024-04-04 MED ORDER — TOPIRAMATE 25 MG PO TABS
25.0000 mg | ORAL_TABLET | Freq: Two times a day (BID) | ORAL | Status: DC
  Administered 2024-04-04 – 2024-04-08 (×9): 25 mg via ORAL
  Filled 2024-04-04 (×11): qty 1

## 2024-04-04 MED ORDER — POTASSIUM CHLORIDE CRYS ER 20 MEQ PO TBCR
60.0000 meq | EXTENDED_RELEASE_TABLET | Freq: Once | ORAL | Status: AC
  Administered 2024-04-04: 60 meq via ORAL
  Filled 2024-04-04: qty 3

## 2024-04-04 MED ORDER — LABETALOL HCL 5 MG/ML IV SOLN
10.0000 mg | INTRAVENOUS | Status: DC | PRN
  Administered 2024-04-04 – 2024-04-08 (×5): 10 mg via INTRAVENOUS
  Filled 2024-04-04 (×5): qty 4

## 2024-04-04 MED ORDER — METOPROLOL SUCCINATE ER 50 MG PO TB24
75.0000 mg | ORAL_TABLET | Freq: Every day | ORAL | Status: DC
  Administered 2024-04-04 – 2024-04-08 (×5): 75 mg via ORAL
  Filled 2024-04-04 (×5): qty 3

## 2024-04-04 MED ORDER — IOHEXOL 350 MG/ML SOLN
75.0000 mL | Freq: Once | INTRAVENOUS | Status: AC | PRN
  Administered 2024-04-04: 75 mL via INTRAVENOUS

## 2024-04-04 NOTE — Progress Notes (Signed)
 PROGRESS NOTE  Jerome Cardenas  FMW:[id number] DOB: Feb 18, 1951 DOA: 04/03/2024 PCP: Evangelina Tinnie Norris, PA-C   Brief Narrative: Patient is a 73 year old male with history of hypertension, hyperlipidemia, recurrent stroke, prostate cancer, type 2 diabetes , CVA who was brought to the to the emergency department for new onset seizure.  His wife found him on the floor and witnessed her seizure.  He was postictal afterwards and also had weakness.  On presentation, he was hemodynamically stable.  Lab work showed potassium of 3.1, troponin of 146, D-dimer 3.9.  CTA did not show any PE but also showed  possible mild opacification over the superior segment right lowerlobe which could be due to infection versus atelectasis, although may be due to artifact .  CT head did not show any acute findings.  MRI of the brain showed acute 7 mm nonhemorrhagic infarct within the inferior left lentiform nucleus, 4 mm dural-based lesion extending inferiorly from the left side of the tentorium, most consistent with a meningioma.  Neurology consulted currently being worked up for stroke.  Assessment & Plan:  Principal Problem:   Seizures (HCC) Active Problems:   Acute CVA (cerebrovascular accident) [facility])  Seizure: New onset.  Likely associated with stroke.  Neurology following.Started on keppra.  EEG pending today  Acute CVA: History of CVA in the past.  MRI showed cute 7 mm nonhemorrhagic infarct within the inferior left lentiform nucleus.  Initiated the stroke workup.  PT/OT/speech evaluation.  A1c of 6.1, LDL of 108.  Currently on aspirin  and Plavix .  Echocardiogram ordered  Hypertension: Allow permissive hypertension for now.  Monitor blood pressure.  Gradually normalize blood pressure in 3 to 5 days.  Continue pain medication for severe hypertension.  Takes amlodipine , spironolactone , olmesartan, metoprolol  at home  History of prostate cancer: Follows with urology, oncology.  On Xtandi  and leuprolide every  3 months  Suspected pneumonia: Chest x-ray was clear.  But CT also showed  possible mild opacification over the superior segment right lowerlobe, likely artifact.  Antibiotics discontinued  GERD: Continue home medications  Hypokalemia: Supplemented, will monitor  History of hyperlipidemia: Takes fenofibrate , Crestor   History of BPH: Takes finasteride, Flomax   Elevated troponin: Flat trend.  No chest pain.   Likely demand ischemia.  Will check echocardiogram.  Cognitive impairment: As per wife, he is mostly oriented at home but has some confusion with the time.  Patient is currently oriented to place.  Continue delirium precaution, frequent orientation         DVT prophylaxis:enoxaparin  (LOVENOX ) injection 40 mg Start: 04/04/24 1000     Code Status: Prior  Family Communication: Called and discussed with wife [name] on phone on 9/27  Patient status: Inpatient  Patient is from : Home  Anticipated discharge to: Home  Estimated DC date: 1 to 2 days   Consultants: Neuro  Procedures: MRI of the brain  Antimicrobials:  Anti-infectives (From admission, onward)    Start     Dose/Rate Route Frequency Ordered Stop   04/03/24 1330  cefTRIAXone  (ROCEPHIN ) 1 g in sodium chloride  0.9 % 100 mL IVPB        1 g 200 mL/hr over 30 Minutes Intravenous  Once 04/03/24 1324 04/03/24 1441   04/03/24 1330  azithromycin  (ZITHROMAX ) 500 mg in sodium chloride  0.9 % 250 mL IVPB        500 mg 250 mL/hr over 60 Minutes Intravenous  Once 04/03/24 1324 04/03/24 1508       Subjective: Patient seen and examined at bedside  today.  Hemodynamically stable.  Oriented to place but not to time.  Does not appear agitated or any kind of discomfort.  No chest pain.  No clear focal neurological deficits  Objective: Vitals:   04/04/24 0419 04/04/24 0600 04/04/24 0730 04/04/24 0735  BP:  (!) 155/83  (!) 132/111  Pulse:  99  99  Resp:  (!) 26  (!) 27  Temp: 99.7 F (37.6 C)     TempSrc: Oral      SpO2:  94% 96% 96%  Weight:      Height:       No intake or output data in the 24 hours ending 04/04/24 0739 Filed Weights   04/03/24 0851  Weight: 92.1 kg    Examination:  General exam: Overall comfortable, not in distress, pleasantly confused HEENT: PERRL Respiratory system:  no wheezes or crackles  Cardiovascular system: S1 & S2 heard, RRR.  Gastrointestinal system: Abdomen is nondistended, soft and nontender. Central nervous system: Alert and oriented to place only Extremities: No edema, no clubbing ,no cyanosis Skin: No rashes, no ulcers,no icterus     Data Reviewed: I have personally reviewed following labs and imaging studies  CBC: Recent Labs  Lab 04/03/24 0901  WBC 11.2*  HGB 15.8  HCT 43.7  MCV 85.7  PLT 164   Basic Metabolic Panel: Recent Labs  Lab 04/03/24 0901 04/03/24 1518  NA 137  --   K 3.1*  --   CL 101  --   CO2 20*  --   GLUCOSE 193*  --   BUN 14  --   CREATININE 1.18  --   CALCIUM  9.2  --   MG  --  1.7     No results found for this or any previous visit (from the past 240 hours).   Radiology Studies: MR Brain W and Wo Contrast Result Date: 04/03/2024 EXAM: MRI BRAIN WITH AND WITHOUT CONTRAST 04/03/2024 06:26:19 PM TECHNIQUE: Multiplanar multisequence MRI of the head/brain was performed with and without the administration of 9.2 mL gadobutrol  (GADAVIST ) 1 MMOL/ML intravenous contrast. COMPARISON: CT head without contrast 04/03/2024. MR head without contrast 07/28/2022. CLINICAL HISTORY: Brain/CNS neoplasm, staging; Prostate CA, new onset sz. Best images due to pt motion. FINDINGS: BRAIN AND VENTRICLES: An acute 7 mm nonhemorrhagic infarct is present within the inferior left lentiform nucleus. The previously noted left thalamic infarct demonstrates expected evolution. Confluent periventricular white matter changes are moderately advanced for age. This most likely reflects the sequelae of chronic microvascular ischemia. Mild white matter  changes extend into the brainstem. A 4 mm dural-based lesion extends inferiorly from the left side of the tentorium, most consistent with a meningioma. No other pathologic enhancement is present. No acute intracranial hemorrhage. No mass effect or midline shift. No hydrocephalus. The sella is unremarkable. Normal flow voids. ORBITS: No acute abnormality. SINUSES: No acute abnormality. BONES AND SOFT TISSUES: Normal bone marrow signal and enhancement. No acute soft tissue abnormality. IMPRESSION: 1. Acute 7 mm nonhemorrhagic infarct within the inferior left lentiform nucleus. 2. 4 mm dural-based lesion extending inferiorly from the left side of the tentorium, most consistent with a meningioma. 3. Moderately advanced confluent periventricular white matter changes, likely sequelae of chronic microvascular ischemia. Critical value findings were called to Dr. Deretha at 6:47 pm Electronically signed by: Lonni Necessary MD 04/03/2024 06:50 PM EDT RP Workstation: [id number]   CT Angio Chest PE W and/or Wo Contrast Result Date: 04/03/2024 CLINICAL DATA:  Positive D-dimer. Seizures. Low to intermediate probability for  pulmonary embolism. EXAM: CT ANGIOGRAPHY CHEST WITH CONTRAST TECHNIQUE: Multidetector CT imaging of the chest was performed using the standard protocol during bolus administration of intravenous contrast. Multiplanar CT image reconstructions and MIPs were obtained to evaluate the vascular anatomy. RADIATION DOSE REDUCTION: This exam was performed according to the departmental dose-optimization program which includes automated exposure control, adjustment of the mA and/or kV according to patient size and/or use of iterative reconstruction technique. CONTRAST:  75mL OMNIPAQUE  IOHEXOL  350 MG/ML SOLN COMPARISON:  Chest CT 02/25/2008 and abdominal CT 10/15/2023 FINDINGS: Cardiovascular: Borderline cardiomegaly. Mild calcified plaque over the left anterior descending coronary artery. Thoracic aorta measures 3.8  cm in AP diameter. Thoracic aorta is otherwise unremarkable. Adequate opacification of the pulmonary arterial system without evidence of pulmonary embolism. Remaining vascular structures are unremarkable. Mediastinum/Nodes: Mediastinum demonstrates no evidence of adenopathy. No hilar adenopathy. Remaining mediastinal structures are unremarkable. Lungs/Pleura: Lungs are adequately inflated. There is significant artifact from metallic device over the posterior right thorax just right of midline. Possible mild opacification over the superior segment right lower lobe which could be due to infection versus atelectasis, although may be due to artifact from the adjacent metallic device over the posterior thorax. Remainder of the right lung is clear. Left lung is clear. Airways are unremarkable. Upper Abdomen: No acute findings over the visualized upper abdominal images. Musculoskeletal: No focal abnormality. Review of the MIP images confirms the above findings. IMPRESSION: 1. No evidence of pulmonary embolism. 2. Possible mild opacification over the superior segment right lower lobe which could be due to infection versus atelectasis, although may be due to artifact from the adjacent metallic device over the posterior thorax. Consider follow-up noncontrast chest CT without metallic device present for further evaluation versus PA and lateral chest radiograph. 3. Borderline cardiomegaly. Mild atherosclerotic coronary artery disease. 4. Aortic atherosclerosis. Aortic Atherosclerosis (ICD10-I70.0). Electronically Signed   By: Toribio Agreste M.D.   On: 04/03/2024 12:40   DG Chest Port 1 View Result Date: 04/03/2024 CLINICAL DATA:  Shortness of breath EXAM: PORTABLE CHEST 1 VIEW COMPARISON:  02/25/2008 portable chest and chest CTA. FINDINGS: Poor inspiration. Normal-sized heart. Tortuous and partially calcified thoracic aorta. Clear lungs with normal vascularity. Mildly elevated left hemidiaphragm. Mild lower thoracic spine  degenerative changes. IMPRESSION: No acute abnormality. Electronically Signed   By: Elspeth Bathe M.D.   On: 04/03/2024 10:02   CT Head Wo Contrast Result Date: 04/03/2024 EXAM: CT HEAD WITHOUT CONTRAST 04/03/2024 09:19:00 AM TECHNIQUE: CT of the head was performed without the administration of intravenous contrast. Automated exposure control, iterative reconstruction, and/or weight based adjustment of the mA/kV was utilized to reduce the radiation dose to as low as reasonably achievable. COMPARISON: CT of the head dated 02/05/2024. CLINICAL HISTORY: Mental status change, unknown cause. No contrast. BIB EMS hx of stroke with right side deficit. Also being treated for prostate cancer. Wife heard a moan and checked on him and he was hving seizure like activity that last 90sec. No seizure hx. Trauma to tongue no incontinence. Normally ; alert and oriented. Postictal at this time but slowly coming around. 200/130 Runs of bigemny. CBG 178 20g L wrist. FINDINGS: BRAIN AND VENTRICLES: No acute hemorrhage. No evidence of acute infarct. No hydrocephalus. No extra-axial collection. No mass effect or midline shift. There is age-related atrophy and mild-to-moderate periventricular white matter disease. ORBITS: No acute abnormality. SINUSES: No acute abnormality. SOFT TISSUES AND SKULL: No acute soft tissue abnormality. No skull fracture. IMPRESSION: 1. No acute intracranial abnormality. 2. Age-related cerebral  atrophy and mild-to-moderate periventricular white matter disease. Electronically signed by: Evalene Coho MD 04/03/2024 09:32 AM EDT RP Workstation: [id number]    Scheduled Meds:  aspirin  EC  81 mg Oral Daily   clopidogrel   75 mg Oral Daily   DULoxetine   60 mg Oral Daily   enoxaparin  (LOVENOX ) injection  40 mg Subcutaneous Q24H   enzalutamide   160 mg Oral QPM   fenofibrate   54 mg Oral Daily   levETIRAcetam  500 mg Oral BID   pantoprazole   40 mg Oral Daily   rosuvastatin   40 mg Oral Daily   Continuous  Infusions:   LOS: 1 day   Ivonne Mustache, MD [facility] P9/27/2025, 7:39 AM  PROGRESS NOTE  Jerome Cardenas  FMW:[id number] DOB: [birth year] DOA: 04/03/2024 PCP: Evangelina Tinnie Norris, PA-C   Brief Narrative:   Assessment & Plan:  Principal Problem:   Seizures (HCC) Active Problems:   Acute CVA (cerebrovascular accident) (HCC)           DVT prophylaxis:enoxaparin  (LOVENOX ) injection 40 mg Start: 04/04/24 1000     Code Status: Prior  Family Communication:   Patient status:  Patient is from :  Anticipated discharge to:  Estimated DC date:   Consultants:   Procedures:  Antimicrobials:  Anti-infectives (From admission, onward)    Start     Dose/Rate Route Frequency Ordered Stop   04/03/24 1330  cefTRIAXone  (ROCEPHIN ) 1 g in sodium chloride  0.9 % 100 mL IVPB        1 g 200 mL/hr over 30 Minutes Intravenous  Once 04/03/24 1324 04/03/24 1441   04/03/24 1330  azithromycin  (ZITHROMAX ) 500 mg in sodium chloride  0.9 % 250 mL IVPB        500 mg 250 mL/hr over 60 Minutes Intravenous  Once 04/03/24 1324 04/03/24 1508       Subjective:   Objective: Vitals:   04/04/24 0419 04/04/24 0600 04/04/24 0730 04/04/24 0735  BP:  (!) 155/83  (!) 132/111  Pulse:  99  99  Resp:  (!) 26  (!) 27  Temp: 99.7 F (37.6 C)     TempSrc: Oral     SpO2:  94% 96% 96%  Weight:      Height:       No intake or output data in the 24 hours ending 04/04/24 0739 Filed Weights   04/03/24 0851  Weight: 92.1 kg    Examination:  General exam: Overall comfortable, not in distress HEENT: PERRL Respiratory system:  no wheezes or crackles  Cardiovascular system: S1 & S2 heard, RRR.  Gastrointestinal system: Abdomen is nondistended, soft and nontender. Central nervous system: Alert and oriented Extremities: No edema, no clubbing ,no cyanosis Skin: No rashes, no ulcers,no icterus     Data Reviewed: I have personally reviewed following labs and imaging  studies  CBC: Recent Labs  Lab 04/03/24 0901  WBC 11.2*  HGB 15.8  HCT 43.7  MCV 85.7  PLT 164   Basic Metabolic Panel: Recent Labs  Lab 04/03/24 0901 04/03/24 1518  NA 137  --   K 3.1*  --   CL 101  --   CO2 20*  --   GLUCOSE 193*  --   BUN 14  --   CREATININE 1.18  --   CALCIUM  9.2  --   MG  --  1.7     No results found for this or any previous visit (from the past 240 hours).   Radiology Studies: MR Brain W and Wo  Contrast Result Date: 04/03/2024 EXAM: MRI BRAIN WITH AND WITHOUT CONTRAST 04/03/2024 06:26:19 PM TECHNIQUE: Multiplanar multisequence MRI of the head/brain was performed with and without the administration of 9.2 mL gadobutrol  (GADAVIST ) 1 MMOL/ML intravenous contrast. COMPARISON: CT head without contrast 04/03/2024. MR head without contrast 07/28/2022. CLINICAL HISTORY: Brain/CNS neoplasm, staging; Prostate CA, new onset sz. Best images due to pt motion. FINDINGS: BRAIN AND VENTRICLES: An acute 7 mm nonhemorrhagic infarct is present within the inferior left lentiform nucleus. The previously noted left thalamic infarct demonstrates expected evolution. Confluent periventricular white matter changes are moderately advanced for age. This most likely reflects the sequelae of chronic microvascular ischemia. Mild white matter changes extend into the brainstem. A 4 mm dural-based lesion extends inferiorly from the left side of the tentorium, most consistent with a meningioma. No other pathologic enhancement is present. No acute intracranial hemorrhage. No mass effect or midline shift. No hydrocephalus. The sella is unremarkable. Normal flow voids. ORBITS: No acute abnormality. SINUSES: No acute abnormality. BONES AND SOFT TISSUES: Normal bone marrow signal and enhancement. No acute soft tissue abnormality. IMPRESSION: 1. Acute 7 mm nonhemorrhagic infarct within the inferior left lentiform nucleus. 2. 4 mm dural-based lesion extending inferiorly from the left side of the  tentorium, most consistent with a meningioma. 3. Moderately advanced confluent periventricular white matter changes, likely sequelae of chronic microvascular ischemia. Critical value findings were called to Dr. Deretha at 6:47 pm Electronically signed by: Lonni Necessary MD 04/03/2024 06:50 PM EDT RP Workstation: [id number]   CT Angio Chest PE W and/or Wo Contrast Result Date: 04/03/2024 CLINICAL DATA:  Positive D-dimer. Seizures. Low to intermediate probability for pulmonary embolism. EXAM: CT ANGIOGRAPHY CHEST WITH CONTRAST TECHNIQUE: Multidetector CT imaging of the chest was performed using the standard protocol during bolus administration of intravenous contrast. Multiplanar CT image reconstructions and MIPs were obtained to evaluate the vascular anatomy. RADIATION DOSE REDUCTION: This exam was performed according to the departmental dose-optimization program which includes automated exposure control, adjustment of the mA and/or kV according to patient size and/or use of iterative reconstruction technique. CONTRAST:  75mL OMNIPAQUE  IOHEXOL  350 MG/ML SOLN COMPARISON:  Chest CT 02/25/2008 and abdominal CT 10/15/2023 FINDINGS: Cardiovascular: Borderline cardiomegaly. Mild calcified plaque over the left anterior descending coronary artery. Thoracic aorta measures 3.8 cm in AP diameter. Thoracic aorta is otherwise unremarkable. Adequate opacification of the pulmonary arterial system without evidence of pulmonary embolism. Remaining vascular structures are unremarkable. Mediastinum/Nodes: Mediastinum demonstrates no evidence of adenopathy. No hilar adenopathy. Remaining mediastinal structures are unremarkable. Lungs/Pleura: Lungs are adequately inflated. There is significant artifact from metallic device over the posterior right thorax just right of midline. Possible mild opacification over the superior segment right lower lobe which could be due to infection versus atelectasis, although may be due to artifact  from the adjacent metallic device over the posterior thorax. Remainder of the right lung is clear. Left lung is clear. Airways are unremarkable. Upper Abdomen: No acute findings over the visualized upper abdominal images. Musculoskeletal: No focal abnormality. Review of the MIP images confirms the above findings. IMPRESSION: 1. No evidence of pulmonary embolism. 2. Possible mild opacification over the superior segment right lower lobe which could be due to infection versus atelectasis, although may be due to artifact from the adjacent metallic device over the posterior thorax. Consider follow-up noncontrast chest CT without metallic device present for further evaluation versus PA and lateral chest radiograph. 3. Borderline cardiomegaly. Mild atherosclerotic coronary artery disease. 4. Aortic atherosclerosis. Aortic Atherosclerosis (ICD10-I70.0). Electronically Signed  By: Toribio Agreste M.D.   On: 04/03/2024 12:40   DG Chest Port 1 View Result Date: 04/03/2024 CLINICAL DATA:  Shortness of breath EXAM: PORTABLE CHEST 1 VIEW COMPARISON:  02/25/2008 portable chest and chest CTA. FINDINGS: Poor inspiration. Normal-sized heart. Tortuous and partially calcified thoracic aorta. Clear lungs with normal vascularity. Mildly elevated left hemidiaphragm. Mild lower thoracic spine degenerative changes. IMPRESSION: No acute abnormality. Electronically Signed   By: Elspeth Bathe M.D.   On: 04/03/2024 10:02   CT Head Wo Contrast Result Date: 04/03/2024 EXAM: CT HEAD WITHOUT CONTRAST 04/03/2024 09:19:00 AM TECHNIQUE: CT of the head was performed without the administration of intravenous contrast. Automated exposure control, iterative reconstruction, and/or weight based adjustment of the mA/kV was utilized to reduce the radiation dose to as low as reasonably achievable. COMPARISON: CT of the head dated 02/05/2024. CLINICAL HISTORY: Mental status change, unknown cause. No contrast. BIB EMS hx of stroke with right side deficit.  Also being treated for prostate cancer. Wife heard a moan and checked on him and he was hving seizure like activity that last 90sec. No seizure hx. Trauma to tongue no incontinence. Normally ; alert and oriented. Postictal at this time but slowly coming around. 200/130 Runs of bigemny. CBG 178 20g L wrist. FINDINGS: BRAIN AND VENTRICLES: No acute hemorrhage. No evidence of acute infarct. No hydrocephalus. No extra-axial collection. No mass effect or midline shift. There is age-related atrophy and mild-to-moderate periventricular white matter disease. ORBITS: No acute abnormality. SINUSES: No acute abnormality. SOFT TISSUES AND SKULL: No acute soft tissue abnormality. No skull fracture. IMPRESSION: 1. No acute intracranial abnormality. 2. Age-related cerebral atrophy and mild-to-moderate periventricular white matter disease. Electronically signed by: Evalene Coho MD 04/03/2024 09:32 AM EDT RP Workstation: [id number]    Scheduled Meds:  aspirin  EC  81 mg Oral Daily   clopidogrel   75 mg Oral Daily   DULoxetine   60 mg Oral Daily   enoxaparin  (LOVENOX ) injection  40 mg Subcutaneous Q24H   enzalutamide   160 mg Oral QPM   fenofibrate   54 mg Oral Daily   levETIRAcetam  500 mg Oral BID   pantoprazole   40 mg Oral Daily   rosuvastatin   40 mg Oral Daily   Continuous Infusions:   LOS: 1 day   Ivonne Mustache, MD [facility] P9/27/2025, 7:39 AM

## 2024-04-04 NOTE — Evaluation (Signed)
 SLP Cancellation Note  Patient Details Name: Jerome Cardenas MRN: [id number] DOB: [birth year]   Cancelled treatment:       Reason Eval/Treat Not Completed: Other (comment) (Swallow eval order received.  Pt passed [place] swallow screen and no indications of dysphagia seen in chart. Please reorder SLP swallow evaluation if dysphagia concerns present. Thanks.)  Madelin POUR, MS [facility] SLP Acute Rehab Services Office [phone]  Nicolas Emmie Caldron 04/04/2024, 12:56 PM

## 2024-04-04 NOTE — Progress Notes (Signed)
 STROKE TEAM PROGRESS NOTE   SUBJECTIVE (INTERVAL HISTORY) His wife is at the bedside.  Overall his condition is stable now. Per wife, he had fall on 02/05/24 hitting head with bruise on the right side of face, LOC and likely suffered concussion. He had cognitive decline over time. Had left thalamic infarct in 07/2022. No seizure hx in the past.    OBJECTIVE Temp:  [98.9 F (37.2 C)-99.8 F (37.7 C)] 99.8 F (37.7 C) (09/27 1244) Pulse Rate:  [92-103] 103 (09/27 1200) Cardiac Rhythm: Normal sinus rhythm (09/27 0735) Resp:  [15-27] 19 (09/27 1200) BP: (117-168)/(69-123) 150/123 (09/27 1200) SpO2:  [92 %-97 %] 97 % (09/27 1200)  Recent Labs  Lab 04/03/24 0856  GLUCAP 199*   Recent Labs  Lab 04/03/24 0901 04/03/24 1518 04/04/24 1020  NA 137  --  138  K 3.1*  --  3.1*  CL 101  --  111  CO2 20*  --  19*  GLUCOSE 193*  --  174*  BUN 14  --  8  CREATININE 1.18  --  0.74  CALCIUM  9.2  --  7.3*  MG  --  1.7  --    No results for input(s): AST, ALT, ALKPHOS, BILITOT, PROT, ALBUMIN in the last 168 hours. Recent Labs  Lab 04/03/24 0901  WBC 11.2*  HGB 15.8  HCT 43.7  MCV 85.7  PLT 164   No results for input(s): CKTOTAL, CKMB, CKMBINDEX, TROPONINI in the last 168 hours. No results for input(s): LABPROT, INR in the last 72 hours. Recent Labs    04/04/24 0824  COLORURINE YELLOW  LABSPEC 1.045*  PHURINE 5.0  GLUCOSEU NEGATIVE  HGBUR SMALL*  BILIRUBINUR NEGATIVE  KETONESUR NEGATIVE  PROTEINUR 30*  NITRITE NEGATIVE  LEUKOCYTESUR NEGATIVE       Component Value Date/Time   CHOL 159 04/03/2024 1704   TRIG 100 04/03/2024 1704   HDL 31 (L) 04/03/2024 1704   CHOLHDL 5.1 04/03/2024 1704   VLDL 20 04/03/2024 1704   LDLCALC 108 (H) 04/03/2024 1704   Lab Results  Component Value Date   HGBA1C 6.1 (H) 04/03/2024   No results found for: LABOPIA, COCAINSCRNUR, LABBENZ, AMPHETMU, THCU, LABBARB  No results for input(s): ETH in the last  168 hours.  I have personally reviewed the radiological images below and agree with the radiology interpretations.  ECHOCARDIOGRAM COMPLETE Result Date: 04/04/2024    ECHOCARDIOGRAM REPORT   Patient Name:   Jerome Cardenas [facility] Date of Exam: 04/04/2024 Medical Rec #:  [id number]       Height:       68.0 in Accession #:    [id number]      Weight:       203.0 lb Date of Birth:  April 16, 1951       BSA:          2.057 m Patient Age:    73 years        BP:           138/101 mmHg Patient Gender: M               HR:           100 bpm. Exam Location:  Inpatient Procedure: 2D Echo, Color Doppler, Cardiac Doppler and 3D Echo (Both Spectral            and Color Flow Doppler were utilized during procedure). Indications:    Elevated Troponin  History:        Patient has prior  history of Echocardiogram examinations, most                 recent 07/29/2022. Risk Factors:Hypertension and Diabetes.                 Cancer, Chronic Kidney Disease.  Sonographer:    Logan Shove RDCS Referring Phys: [id number] [facility] IMPRESSIONS  1. Left ventricular ejection fraction, by estimation, is 60 to 65%. The left ventricle has normal function. The left ventricle has no regional wall motion abnormalities. Left ventricular diastolic parameters are consistent with Grade I diastolic dysfunction (impaired relaxation).  2. Right ventricular systolic function is normal. The right ventricular size is normal.  3. The mitral valve is normal in structure. No evidence of mitral valve regurgitation. No evidence of mitral stenosis.  4. The aortic valve is tricuspid. Aortic valve regurgitation is trivial. No aortic stenosis is present.  5. The inferior vena cava is normal in size with greater than 50% respiratory variability, suggesting right atrial pressure of 3 mmHg. FINDINGS  Left Ventricle: Left ventricular ejection fraction, by estimation, is 60 to 65%. The left ventricle has normal function. The left ventricle has no regional wall motion abnormalities. The  left ventricular internal cavity size was normal in size. There is  no left ventricular hypertrophy. Left ventricular diastolic parameters are consistent with Grade I diastolic dysfunction (impaired relaxation). Right Ventricle: The right ventricular size is normal. Right ventricular systolic function is normal. Left Atrium: Left atrial size was normal in size. Right Atrium: Right atrial size was normal in size. Pericardium: There is no evidence of pericardial effusion. Mitral Valve: The mitral valve is normal in structure. No evidence of mitral valve regurgitation. No evidence of mitral valve stenosis. Tricuspid Valve: The tricuspid valve is normal in structure. Tricuspid valve regurgitation is trivial. No evidence of tricuspid stenosis. Aortic Valve: The aortic valve is tricuspid. Aortic valve regurgitation is trivial. No aortic stenosis is present. Aortic valve mean gradient measures 4.5 mmHg. Aortic valve peak gradient measures 9.3 mmHg. Aortic valve area, by VTI measures 1.91 cm. Pulmonic Valve: The pulmonic valve was normal in structure. Pulmonic valve regurgitation is not visualized. No evidence of pulmonic stenosis. Aorta: The aortic root is normal in size and structure. Venous: The inferior vena cava is normal in size with greater than 50% respiratory variability, suggesting right atrial pressure of 3 mmHg. IAS/Shunts: No atrial level shunt detected by color flow Doppler. Additional Comments: 3D was performed not requiring image post processing on an independent workstation and was normal.  LEFT VENTRICLE PLAX 2D LVIDd:         4.70 cm LVIDs:         3.30 cm LV PW:         1.00 cm LV IVS:        0.80 cm LVOT diam:     2.20 cm   3D Volume EF: LV SV:         54        3D EF:        56 % LV SV Index:   26        LV EDV:       118 ml LVOT Area:     3.80 cm  LV ESV:       52 ml                          LV SV:  66 ml RIGHT VENTRICLE             IVC RV Basal diam:  2.30 cm     IVC diam: 1.70 cm RV S prime:      18.42 cm/s TAPSE (M-mode): 2.0 cm LEFT ATRIUM             Index        RIGHT ATRIUM          Index LA diam:        3.40 cm 1.65 cm/m   RA Area:     9.44 cm LA Vol (A2C):   52.8 ml 25.67 ml/m  RA Volume:   16.40 ml 7.97 ml/m LA Vol (A4C):   66.7 ml 32.43 ml/m LA Biplane Vol: 63.8 ml 31.02 ml/m  AORTIC VALVE AV Area (Vmax):    1.86 cm AV Area (Vmean):   1.86 cm AV Area (VTI):     1.91 cm AV Vmax:           152.50 cm/s AV Vmean:          99.300 cm/s AV VTI:            0.280 m AV Peak Grad:      9.3 mmHg AV Mean Grad:      4.5 mmHg LVOT Vmax:         74.50 cm/s LVOT Vmean:        48.600 cm/s LVOT VTI:          0.141 m LVOT/AV VTI ratio: 0.50  AORTA Ao Root diam: 3.20 cm Ao Asc diam:  3.50 cm  SHUNTS Systemic VTI:  0.14 m Systemic Diam: 2.20 cm Redell Shallow MD Electronically signed by Redell Shallow MD Signature Date/Time: 04/04/2024/1:14:12 PM    Final    CT ANGIO HEAD NECK W WO CM Result Date: 04/04/2024 CLINICAL DATA:  73 year old male with evidence of left MCA territory lacunar infarct on MRI yesterday. Neurologic deficit, seizure. 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 EXAM: CT ANGIOGRAPHY HEAD AND NECK WITH AND WITHOUT CONTRAST TECHNIQUE: Multidetector CT imaging of the head and neck was performed using the standard protocol during bolus administration of intravenous contrast. Multiplanar CT image reconstructions and MIPs were obtained to evaluate the vascular anatomy. Carotid stenosis measurements (when applicable) are obtained utilizing NASCET criteria, using the distal internal carotid diameter as the denominator. RADIATION DOSE REDUCTION: This exam was performed according to the departmental dose-optimization program which includes automated exposure control, adjustment of the mA and/or kV according to patient size and/or use of iterative reconstruction technique. CONTRAST:  75mL OMNIPAQUE  IOHEXOL  350 MG/ML SOLN COMPARISON:  Brain MRI yesterday.  Head CT yesterday. CTA head and  neck 07/29/2022. FINDINGS: CT HEAD Brain: Stable non contrast CT appearance of the brain. Chronic small vessel disease including left thalamic lacunar infarct, bilateral white matter hypodensity. Suspected left MCA territory lacune and small left posterior fossa meningioma occult by CT. No acute intracranial hemorrhage or mass effect. Calvarium and skull base: Stable and intact. Paranasal sinuses: Visualized paranasal sinuses and mastoids are stable and well aerated. Orbits: Small left forehead benign scalp lipoma.  No gaze deviation. CTA NECK Skeleton: Chronic cervical spine degeneration. No acute osseous abnormality identified. Upper chest: Dense left subclavian and innominate venous contrast streak artifact. Otherwise negative. Other neck: Nonvascular neck soft tissue spaces appears stable from last year and within normal limits. Aortic arch: Calcified arch atherosclerosis. Three vessel arch configuration. Right carotid system: Patent with chronic brachiocephalic artery  and right CCA tortuosity. Mild soft and calcified plaque at the proximal right ICA without stenosis. Left carotid system: Patent with tortuosity, mild soft plaque in the left CCA. Minimal plaque at the left carotid bifurcation. No stenosis. Vertebral arteries: Tortuous proximal right subclavian artery without stenosis. Tortuous right vertebral artery origin with perhaps mild soft plaque but no stenosis. Patent right vertebral artery to the skull base without stenosis. Proximal left subclavian artery and left vertebral artery origin appear normal. Possible mild V1 segment plaque. Mildly dominant appearance of the left vertebral artery. No stenosis to the skull base. CTA HEAD Posterior circulation: Patent distal vertebral arteries and vertebrobasilar junction. Chronically dominant appearance of the left V4 segment, although fairly normal caliber. Chronically atherosclerotic and non dominant appearance of the distal right vertebral artery which  remains patent. Mild left V4 plaque also. No hemodynamically significant distal vertebral stenosis. Patent basilar artery with mild irregularity. Fetal left PCA origin. Distal basilar and right PCA origin remain patent. Bilateral PCAs are patent, and bilateral PCA irregularity is less pronounced compared to the previous CTA. Up to moderate irregularity now right PCA P1/P2 junction and bilateral P3 segments (series 14, image 21). Anterior circulation: Both ICA siphons are patent. Mildly tortuous left siphon with mild calcified plaque and no stenosis. Normal left posterior communicating artery origin. Mild tortuosity of the right siphon. Mild to moderate right siphon calcified plaque without stenosis. Patent carotid termini. Patent MCA and ACA origins. Mildly dominant left A1. Diminutive or absent anterior communicating artery. Bilateral ACA branches are patent with increased mild to moderate distal A2 segment irregularity compared to last year (series 13, image 22). MCA M1 segments and MCA bifurcations are patent. Left MCA distal M1, bifurcation, and proximal M2 mild to moderate irregularity appears stable on series 14, image 21. Comparatively mild right MCA bifurcation irregularity. MCA branches appear stable with mild irregularity bilaterally. Venous sinuses: Early contrast timing, not evaluated. Anatomic variants: Mildly dominant left vertebral artery, left ACA A1, fetal type left PCA origin. Review of the MIP images confirms the above findings IMPRESSION: 1. Negative for large vessel occlusion. Chronic extracranial artery tortuosity but little extracranial atherosclerosis. Chronically more advanced Intracranial Atherosclerosis. Notable for: - increased ACA A2 segment irregularity and stenosis, mild-to-moderate. - stable left > right MCA segment irregularity and stenosis, mild-to-moderate. - improved appearance of bilateral PCA irregularity and stenosis, now moderate. 2. Stable non contrast CT appearance of the  brain. 3.  Aortic Atherosclerosis (ICD10-I70.0). Electronically Signed   By: VEAR Hurst M.D.   On: 04/04/2024 07:43   MR Brain W and Wo Contrast Result Date: 04/03/2024 EXAM: MRI BRAIN WITH AND WITHOUT CONTRAST 04/03/2024 06:26:19 PM TECHNIQUE: Multiplanar multisequence MRI of the head/brain was performed with and without the administration of 9.2 mL gadobutrol  (GADAVIST ) 1 MMOL/ML intravenous contrast. COMPARISON: CT head without contrast 04/03/2024. MR head without contrast 07/28/2022. CLINICAL HISTORY: Brain/CNS neoplasm, staging; Prostate CA, new onset sz. Best images due to pt motion. FINDINGS: BRAIN AND VENTRICLES: An acute 7 mm nonhemorrhagic infarct is present within the inferior left lentiform nucleus. The previously noted left thalamic infarct demonstrates expected evolution. Confluent periventricular white matter changes are moderately advanced for age. This most likely reflects the sequelae of chronic microvascular ischemia. Mild white matter changes extend into the brainstem. A 4 mm dural-based lesion extends inferiorly from the left side of the tentorium, most consistent with a meningioma. No other pathologic enhancement is present. No acute intracranial hemorrhage. No mass effect or midline shift. No hydrocephalus. The sella is  unremarkable. Normal flow voids. ORBITS: No acute abnormality. SINUSES: No acute abnormality. BONES AND SOFT TISSUES: Normal bone marrow signal and enhancement. No acute soft tissue abnormality. IMPRESSION: 1. Acute 7 mm nonhemorrhagic infarct within the inferior left lentiform nucleus. 2. 4 mm dural-based lesion extending inferiorly from the left side of the tentorium, most consistent with a meningioma. 3. Moderately advanced confluent periventricular white matter changes, likely sequelae of chronic microvascular ischemia. Critical value findings were called to Dr. Deretha at 6:47 pm Electronically signed by: Lonni Necessary MD 04/03/2024 06:50 PM EDT RP Workstation:  [id number]   CT Angio Chest PE W and/or Wo Contrast Result Date: 04/03/2024 CLINICAL DATA:  Positive D-dimer. Seizures. Low to intermediate probability for pulmonary embolism. EXAM: CT ANGIOGRAPHY CHEST WITH CONTRAST TECHNIQUE: Multidetector CT imaging of the chest was performed using the standard protocol during bolus administration of intravenous contrast. Multiplanar CT image reconstructions and MIPs were obtained to evaluate the vascular anatomy. RADIATION DOSE REDUCTION: This exam was performed according to the departmental dose-optimization program which includes automated exposure control, adjustment of the mA and/or kV according to patient size and/or use of iterative reconstruction technique. CONTRAST:  75mL OMNIPAQUE  IOHEXOL  350 MG/ML SOLN COMPARISON:  Chest CT 02/25/2008 and abdominal CT 10/15/2023 FINDINGS: Cardiovascular: Borderline cardiomegaly. Mild calcified plaque over the left anterior descending coronary artery. Thoracic aorta measures 3.8 cm in AP diameter. Thoracic aorta is otherwise unremarkable. Adequate opacification of the pulmonary arterial system without evidence of pulmonary embolism. Remaining vascular structures are unremarkable. Mediastinum/Nodes: Mediastinum demonstrates no evidence of adenopathy. No hilar adenopathy. Remaining mediastinal structures are unremarkable. Lungs/Pleura: Lungs are adequately inflated. There is significant artifact from metallic device over the posterior right thorax just right of midline. Possible mild opacification over the superior segment right lower lobe which could be due to infection versus atelectasis, although may be due to artifact from the adjacent metallic device over the posterior thorax. Remainder of the right lung is clear. Left lung is clear. Airways are unremarkable. Upper Abdomen: No acute findings over the visualized upper abdominal images. Musculoskeletal: No focal abnormality. Review of the MIP images confirms the above findings.  IMPRESSION: 1. No evidence of pulmonary embolism. 2. Possible mild opacification over the superior segment right lower lobe which could be due to infection versus atelectasis, although may be due to artifact from the adjacent metallic device over the posterior thorax. Consider follow-up noncontrast chest CT without metallic device present for further evaluation versus PA and lateral chest radiograph. 3. Borderline cardiomegaly. Mild atherosclerotic coronary artery disease. 4. Aortic atherosclerosis. Aortic Atherosclerosis (ICD10-I70.0). Electronically Signed   By: Toribio Agreste M.D.   On: 04/03/2024 12:40   DG Chest Port 1 View Result Date: 04/03/2024 CLINICAL DATA:  Shortness of breath EXAM: PORTABLE CHEST 1 VIEW COMPARISON:  02/25/2008 portable chest and chest CTA. FINDINGS: Poor inspiration. Normal-sized heart. Tortuous and partially calcified thoracic aorta. Clear lungs with normal vascularity. Mildly elevated left hemidiaphragm. Mild lower thoracic spine degenerative changes. IMPRESSION: No acute abnormality. Electronically Signed   By: Elspeth Bathe M.D.   On: 04/03/2024 10:02   CT Head Wo Contrast Result Date: 04/03/2024 EXAM: CT HEAD WITHOUT CONTRAST 04/03/2024 09:19:00 AM TECHNIQUE: CT of the head was performed without the administration of intravenous contrast. Automated exposure control, iterative reconstruction, and/or weight based adjustment of the mA/kV was utilized to reduce the radiation dose to as low as reasonably achievable. COMPARISON: CT of the head dated 02/05/2024. CLINICAL HISTORY: Mental status change, unknown cause. No contrast. BIB EMS hx of  stroke with right side deficit. Also being treated for prostate cancer. Wife heard a moan and checked on him and he was hving seizure like activity that last 90sec. No seizure hx. Trauma to tongue no incontinence. Normally ; alert and oriented. Postictal at this time but slowly coming around. 200/130 Runs of bigemny. CBG 178 20g L wrist. FINDINGS:  BRAIN AND VENTRICLES: No acute hemorrhage. No evidence of acute infarct. No hydrocephalus. No extra-axial collection. No mass effect or midline shift. There is age-related atrophy and mild-to-moderate periventricular white matter disease. ORBITS: No acute abnormality. SINUSES: No acute abnormality. SOFT TISSUES AND SKULL: No acute soft tissue abnormality. No skull fracture. IMPRESSION: 1. No acute intracranial abnormality. 2. Age-related cerebral atrophy and mild-to-moderate periventricular white matter disease. Electronically signed by: Evalene Coho MD 04/03/2024 09:32 AM EDT RP Workstation: [id number]     PHYSICAL EXAM  Temp:  [98.9 F (37.2 C)-99.8 F (37.7 C)] 99.8 F (37.7 C) (09/27 1244) Pulse Rate:  [92-103] 103 (09/27 1200) Resp:  [15-27] 19 (09/27 1200) BP: (117-168)/(69-123) 150/123 (09/27 1200) SpO2:  [92 %-97 %] 97 % (09/27 1200)  General - Well nourished, well developed, in no apparent distress.  Ophthalmologic - fundi not visualized due to noncooperation.  Cardiovascular - Regular rhythm with tachycardia.  Neuro - awake, alert, mild lethargy, eyes open, orientated to place, year and people, but not orientated to month, and told me age 13 instead of 36. No aphasia, paucity of speech, but following all simple commands. Able to name and repeat. No gaze palsy, tracking bilaterally, visual field full. No facial droop. Tongue midline. Bilateral UEs 4/5, no drift. Bilaterally LEs 3/5, no drift. Sensation symmetrical bilaterally subjectively, b/l FTN intact but slow, gait not tested.     ASSESSMENT/PLAN Mr. ANDREAS SOBOLEWSKI is a 73 y.o. male with history of hypertension, hyperlipidemia, diabetes, prostate cancer, stroke, recent fall, questionable dementia admitted for seizure. No TNK given due to outside window.    Seizure, etiology for seizure episode not quite clear, could be due to current stroke, recent fall with concussion, cognitive decline No history of seizure GTC  seizure lasting 2 to 3 minutes at home per wife followed by postictal EEG normal On Keppra 500 twice daily now Continue home Topamax  UA negative CXR negative Blood culture pending No driving until seizure-free for 6 months and under physicians care  Stroke:  left BG small infarct and left parietal occipital punctate periventricular infarct, likely secondary to small vessel disease source, cannot completely rule out cardioembolic source CT no acute abnormality CT head and neck mild to moderate bilateral A2, MCA and PCA stenosis MRI  Acute 7 mm nonhemorrhagic infarct within the inferior left lentiform nucleus.  On my read, there is 1 punctate periventricular infarct at left parietal occipital region. 2D Echo EF 60 to 65% Recommend 30-day cardiac event monitor as outpatient to rule out A-fib LDL 108 HgbA1c 6.1 SCDs for VTE prophylaxis aspirin  81 mg daily prior to admission, now on aspirin  81 mg daily and clopidogrel  75 mg daily DAPT for 3 weeks and then Plavix  alone. Patient counseled to be compliant with his antithrombotic medications Ongoing aggressive stroke risk factor management Therapy recommendations: Pending Disposition: Pending  History of stroke 07/2022 admitted for left thalamic infarct.  CT head and neck showed left P3 occlusion.  EF 60 to 65%, LDL 67, A1c 6.6, TG 371, discharged on DAPT for 3 months and then Crestor  20.  Patient with residual mild right-sided weakness per wife.  Diabetes HgbA1c 6.1  goal < 7.0 Controlled CBG monitoring SSI DM education and close PCP follow up  Hypertension Home meds including metoprolol  and spironolactone  Stable on high and With tachycardia Resume home metoprolol  Avoid low BP Long term BP goal normotensive  Hyperlipidemia Home meds: Crestor  20 and Tricor  LDL 108, goal < 70 Now on Crestor  40 and Tricor  Continue statin and Tricor  at discharge  Other Stroke Risk Factors Advanced age Obesity, Body mass index is 30.87 kg/m.    Other Active Problems Cognitive decline Recent fall on 02/05/2024, he had with LOC, consistent with concussion Prostate cancer on Xtandi   Hospital day # 1  I spent extensive total face-to-face time with the patient and family, reviewing test results, images and medication, and discussing the diagnosis, treatment plan and potential prognosis. This patient's care requiresreview of multiple databases, neurological assessment, discussion with family, other specialists and medical decision making of high complexity. I had long discussion with wife at bedside, updated pt current condition, treatment plan and potential prognosis, and answered all the questions.  She expressed understanding and appreciation.    Ary Cummins, MD PhD Stroke Neurology 04/04/2024 2:42 PM    To contact Stroke Continuity provider, please refer to [email or web address]. After hours, contact General Neurology

## 2024-04-04 NOTE — Progress Notes (Signed)
 Echocardiogram 2D Echocardiogram has been performed.  Ryleigh Esqueda N Hubert Raatz,RDCS 04/04/2024, 11:17 AM

## 2024-04-04 NOTE — ED Notes (Signed)
 Pt brief changed, pt attempted to use the urinal for urine sample, unable to use urinal

## 2024-04-04 NOTE — ED Notes (Signed)
 Pt brief changed.

## 2024-04-04 NOTE — Procedures (Signed)
 Routine EEG Report  Jerome Cardenas is a 73 y.o. male with a history of seizure who is undergoing an EEG to evaluate for seizures.  Report: This EEG was acquired with electrodes placed according to the International 10-20 electrode system (including Fp1, Fp2, F3, F4, C3, C4, P3, P4, O1, O2, T3, T4, T5, T6, A1, A2, Fz, Cz, Pz). The following electrodes were missing or displaced: none.  The occipital dominant rhythm was 9 Hz. This activity is reactive to stimulation. Drowsiness was manifested by background fragmentation; deeper stages of sleep were identified by K complexes and sleep spindles. There was no focal slowing. There were no interictal epileptiform discharges. There were no electrographic seizures identified.   Impression: This EEG was obtained while awake and asleep and is normal.    Clinical Correlation: Normal EEGs, however, do not rule out epilepsy.  Elida Ross, MD [facility] [phone]  If 7pm- 7am, please page neurology on call as listed in AMION.

## 2024-04-04 NOTE — ED Notes (Signed)
 Pt transported to CT ?

## 2024-04-04 NOTE — Consult Note (Signed)
 NEUROLOGY CONSULT NOTE   Date of service: April 04, 2024 Patient Name: Jerome Cardenas MRN:  [id number] DOB:  07-22-1950 Chief Complaint: seizure, stroke on MRI Brain Requesting Provider: Jillian Buttery, MD  History of Present Illness  Jerome Cardenas is a 73 y.o. 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  Wife reports that they sleep in different rooms.  She heard him yell out really loud and when she went in and checked in on him, she found the patient stiff, eyes open with violent jerking of all extremities which went on for about 2 minutes.  She reports that afterwards, patient was snoring and difficult to arouse.  She called EMS who brought him to the ED.  Wife reports that about 2 months ago, patient was at the PCPs office and had a fall where he hit his head pretty hard with a bruise around his right eye and right cheek and did lose consciousness.  She reports that this happened on February 05, 2024.  Wife also mentions concerns for a gradual decline in his cognition and repeating himself over the last several months and she is worried about underlying dementia.  Patient had an MRI of the brain without contrast as part of the first time seizure workup which demonstrated a incidental lacunar infarct in the left lentiform nucleus.  Neurology was consulted for further evaluation and workup.  Patient does not have any focal deficits.  Patient is currently admitted for pneumonia which primary team suspect is likely secondary to aspiration from seizure.  LKW: Unclear, stroke is incidental. Modified rankin score: 2-Slight disability-UNABLE to perform all activities but does not need assistance IV Thrombolysis: Not offered, stroke is incidental with no obvious focal deficit.   EVT: Not offered, stroke is  incidental with no focal deficits.  NIHSS components Score: Comment  1a Level of Conscious 0[]  1[]  2[]  3[]      1b LOC Questions 0[]  1[]  2[x]     Tells me he is 33 and that it is February  1c LOC Commands 0[]  1[]  2[]       2 Best Gaze 0[]  1[]  2[]       3 Visual 0[]  1[]  2[]  3[]      4 Facial Palsy 0[]  1[]  2[]  3[]      5a Motor Arm - left 0[]  1[]  2[]  3[]  4[]  UN[]    5b Motor Arm - Right 0[]  1[]  2[]  3[]  4[]  UN[]    6a Motor Leg - Left 0[]  1[]  2[]  3[]  4[]  UN[]    6b Motor Leg - Right 0[]  1[]  2[]  3[]  4[]  UN[]    7 Limb Ataxia 0[]  1[]  2[]  UN[]      8 Sensory 0[]  1[]  2[]  UN[]      9 Best Language 0[]  1[]  2[]  3[]      10 Dysarthria 0[]  1[]  2[]  UN[]      11 Extinct. and Inattention 0[]  1[]  2[]       TOTAL: 2      ROS  Comprehensive ROS performed and pertinent positives documented in HPI   Past History   Past Medical History:  Diagnosis Date   Cancer (HCC)    prostate   CKD (chronic kidney disease) stage 2, GFR 60-89 ml/min 07/28/2022   HLD (hyperlipidemia)    Hypertension     Past Surgical History:  Procedure Laterality Date   APPENDECTOMY     CHOLECYSTECTOMY  HERNIA REPAIR     PROSTATE SURGERY     prostate cancer 2014    Family History: Family History  Problem Relation Age of Onset   Stroke Neg Hx     Social History  reports that he has never smoked. He has never used smokeless tobacco. He reports that he does not drink alcohol and does not use drugs.  Allergies  Allergen Reactions   Pravachol [Pravastatin] Other (See Comments)    Myalgias     Medications   Current Facility-Administered Medications:    aspirin  EC tablet 81 mg, 81 mg, Oral, Daily, Khan, Ghalib, MD, 81 mg at 04/03/24 2119   DULoxetine  (CYMBALTA ) DR capsule 60 mg, 60 mg, Oral, Daily, Fernand Prost, MD   enoxaparin  (LOVENOX ) injection 40 mg, 40 mg, Subcutaneous, Q24H, Fernand Prost, MD   enzalutamide  (XTANDI ) capsule 160 mg, 160 mg, Oral, QPM, Fernand Prost, MD   fenofibrate  tablet 54 mg, 54 mg, Oral, Daily,  Fernand Prost, MD   pantoprazole  (PROTONIX ) EC tablet 40 mg, 40 mg, Oral, Daily, Fernand Prost, MD   rosuvastatin  (CRESTOR ) tablet 40 mg, 40 mg, Oral, Daily, Fernand Prost, MD  Current Outpatient Medications:    aspirin  EC 81 MG tablet, Take 1 tablet (81 mg total) by mouth daily. Swallow whole., Disp: 30 tablet, Rfl: 12   enzalutamide  (XTANDI ) 40 MG capsule, Take 160 mg by mouth every evening., Disp: , Rfl:    amLODipine  (NORVASC ) 10 MG tablet, Take 10 mg by mouth daily., Disp: , Rfl:    APPLE CIDER VINEGAR PO, Take 1 tablet by mouth daily., Disp: , Rfl:    bacitracin  ointment, Apply 1 Application topically 2 (two) times daily. Please apply to the right cheek skin tear., Disp: 120 g, Rfl: 0   calcium  carbonate (SUPER CALCIUM ) 1500 (600 Ca) MG TABS tablet, Take 1 tablet by mouth daily., Disp: , Rfl:    Cholecalciferol  (VITAMIN D -3 PO), Take 1 capsule by mouth daily., Disp: , Rfl:    Cyanocobalamin  (VITAMIN B-12 PO), Take 1 tablet by mouth daily., Disp: , Rfl:    DULoxetine  (CYMBALTA ) 60 MG capsule, Take 60 mg by mouth daily., Disp: , Rfl:    ELDERBERRY PO, Take 1 capsule by mouth daily., Disp: , Rfl:    fenofibrate  (TRICOR ) 48 MG tablet, Take 48 mg by mouth daily., Disp: , Rfl:    finasteride (PROSCAR) 5 MG tablet, Take 5 mg by mouth daily., Disp: , Rfl:    HYDROcodone -acetaminophen  (NORCO/VICODIN) 5-325 MG tablet, Take 1 tablet by mouth every 4 (four) hours as needed., Disp: 8 tablet, Rfl: 0   icosapent Ethyl (VASCEPA) 1 g capsule, Take 1 g by mouth 2 (two) times daily., Disp: , Rfl:    Magnesium 400 MG TABS, Take 400 mg by mouth daily., Disp: , Rfl:    metoprolol  succinate (TOPROL -XL) 50 MG 24 hr tablet, Take 75 mg by mouth daily. Take with or immediately following a meal., Disp: , Rfl:    olmesartan (BENICAR) 40 MG tablet, Take 40 mg by mouth daily., Disp: , Rfl:    omeprazole (PRILOSEC) 20 MG capsule, Take 20 mg by mouth daily., Disp: , Rfl:    rosuvastatin  (CRESTOR ) 20 MG tablet, Take 1  tablet (20 mg total) by mouth daily., Disp: 30 tablet, Rfl: 1   spironolactone  (ALDACTONE ) 50 MG tablet, Take 25 mg by mouth daily., Disp: , Rfl:    tamsulosin  (FLOMAX ) 0.4 MG CAPS capsule, Take 0.4 mg by mouth every evening., Disp: , Rfl:  topiramate  (TOPAMAX ) 25 MG tablet, Take 1 tablet (25 mg total) by mouth 2 (two) times daily., Disp: 60 tablet, Rfl: 11  Vitals   Vitals:   04/04/24 0200 04/04/24 0400 04/04/24 0419 04/04/24 0600  BP: (!) 155/101 (!) 168/92  (!) 155/83  Pulse: 96 96  99  Resp: 20 (!) 21  (!) 26  Temp:   99.7 F (37.6 C)   TempSrc:   Oral   SpO2: 97% 97%  94%  Weight:      Height:        Body mass index is 30.87 kg/m.   Physical Exam   General: Laying comfortably in bed; in no acute distress.  HENT: Normal oropharynx and mucosa. Normal external appearance of ears and nose.  Neck: Supple, no pain or tenderness  CV: No JVD. No peripheral edema.  Pulmonary: Symmetric Chest rise. Normal respiratory effort.  Abdomen: Soft to touch, non-tender.  Ext: No cyanosis, edema, or deformity  Skin: No rash. Normal palpation of skin.   Musculoskeletal: Normal digits and nails by inspection. No clubbing.   Neurologic Examination  Mental status/Cognition: Alert, oriented to self, place, but thinks this is February 2075.  Fair attention.  Speech/language: Fluent, comprehension intact, object naming intact, repetition intact.  Cranial nerves:   CN II Pupils equal and reactive to light, no VF deficits    CN III,IV,VI EOM intact, no gaze preference or deviation, no nystagmus    CN V normal sensation in V1, V2, and V3 segments bilaterally    CN VII no asymmetry, no nasolabial fold flattening    CN VIII normal hearing to speech    CN IX & X normal palatal elevation, no uvular deviation    CN XI 5/5 head turn and 5/5 shoulder shrug bilaterally    CN XII midline tongue protrusion    Motor:  Muscle bulk: Normal, tone normal, pronator drift none tremor none Mvmt Root Nerve   Muscle Right Left Comments  SA C5/6 Ax Deltoid 5 5   EF C5/6 Mc Biceps 5 5   EE C6/7/8 Rad Triceps 5 5   WF C6/7 Med FCR     WE C7/8 PIN ECU     F Ab C8/T1 U ADM/FDI 5 5   HF L1/2/3 Fem Illopsoas 5 5   KE L2/3/4 Fem Quad 5 5   DF L4/5 D Peron Tib Ant 5 5   PF S1/2 Tibial Grc/Sol 5 5    Sensation:  Light touch Intact throughout   Pin prick    Temperature    Vibration   Proprioception    Coordination/Complex Motor:  - Finger to Nose intact bilaterally - Heel to shin intact bilaterally - Rapid alternating movement are normal - Gait: Deferred. Labs/Imaging/Neurodiagnostic studies   CBC:  Recent Labs  Lab 04/25/24 0901  WBC 11.2*  HGB 15.8  HCT 43.7  MCV 85.7  PLT 164   Basic Metabolic Panel:  Lab Results  Component Value Date   NA 137 Apr 25, 2024   K 3.1 (L) April 25, 2024   CO2 20 (L) Apr 25, 2024   GLUCOSE 193 (H) 25-Apr-2024   BUN 14 04/25/2024   CREATININE 1.18 04-25-2024   CALCIUM  9.2 25-Apr-2024   GFRNONAA >60 04-25-24   Lipid Panel:  Lab Results  Component Value Date   LDLCALC 108 (H) 2024/04/25   HgbA1c:  Lab Results  Component Value Date   HGBA1C 6.1 (H) Apr 25, 2024   Urine Drug Screen: No results found for: LABOPIA, COCAINSCRNUR, LABBENZ, AMPHETMU, THCU, LABBARB  Alcohol Level     Component Value Date/Time   ETH <10 07/28/2022 1237   INR  Lab Results  Component Value Date   INR 1.1 07/28/2022   APTT  Lab Results  Component Value Date   APTT 28 07/28/2022   AED levels: No results found for: PHENYTOIN, ZONISAMIDE, LAMOTRIGINE, LEVETIRACETA  CT Head without contrast(Personally reviewed): CTH was negative for a large hypodensity concerning for a large territory infarct or hyperdensity concerning for an ICH  CT angio Head and Neck with contrast(Personally reviewed): Pending  MRI Brain(Personally reviewed): Pending  Neurodiagnostics rEEG:  Pending  ASSESSMENT   Jerome Cardenas is a 73 y.o. 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  In terms of seizure risk factor, he did have a fall with head injury and loss of consciousness about 2 months ago which is concerning for potential concussion.  Given that this is a late seizure after fall/injury, suggest overall increased risk for having seizures in the future.  I will therefore start him on Keppra 500 mg twice daily.  His MRI also demonstrates an incidental left lentiform nucleus infarct.  He will need stroke workup.  I suspect the etiology of infarct is likely small vessel disease.  RECOMMENDATIONS  - Frequent Neuro checks per stroke unit protocol - Recommend Vascular imaging with CT angio of the head and neck. - Recommend obtaining TTE  - Recommend obtaining Lipid panel with LDL - Please start statin if LDL > 70 - Recommend HbA1c to evaluate for diabetes and how well it is controlled. - Antithrombotic -aspirin  81 mg daily along with Plavix  75 mg daily for 21 days, followed by aspirin  81 mg daily alone. - Recommend DVT ppx - SBP goal - permissive hypertension first 24 h < 220/110. Held home meds.  - Recommend Telemetry monitoring for arrythmia - Recommend bedside swallow screen prior to PO intake. - Stroke education booklet - Recommend PT/OT/SLP consult - Routine EEG. -Keppra 500 mg twice daily.  Discussed potential behavioral side effects of Keppra with patient's wife. - Discussed with wife that patient should refrain from driving for 6 months.  He has to be seizure-free before he can resume driving. -Patient would also benefit from outpatient neuropsych testing for concern for underlying dementia. -Full seizure precautions listed at the bottom of the note. ______________________________________________________________________    Signed, Shatonia Hoots,  MD [facility]   Seizure precautions: Per [place]  DMV statutes, patients with seizures are not allowed to drive until they have been seizure-free for six months and cleared by a physician    Use caution when using heavy equipment or power tools. Avoid working on ladders or at heights. Take showers instead of baths. Ensure the water temperature is not too high on the home water heater. Do not go swimming alone. Do not lock yourself in a room alone (i.e. bathroom). When caring for infants or small children, sit down when holding, feeding, or changing them to minimize risk of injury to the child in the event you have a seizure. Maintain good sleep hygiene. Avoid alcohol.    If patient has another seizure, call 911 and bring them back to the ED if: A.  The seizure lasts longer than 5 minutes.      B.  The patient doesn't wake shortly after the seizure or has new problems such as  difficulty seeing, speaking or moving following the seizure C.  The patient was injured during the seizure D.  The patient has a temperature over 102 F (39C) E.  The patient vomited during the seizure and now is having trouble breathing    During the Seizure   - First, ensure adequate ventilation and place patients on the floor on their left side  Loosen clothing around the neck and ensure the airway is patent. If the patient is clenching the teeth, do not force the mouth open with any object as this can cause severe damage - Remove all items from the surrounding that can be hazardous. The patient may be oblivious to what's happening and may not even know what he or she is doing. If the patient is confused and wandering, either gently guide him/her away and block access to outside areas - Reassure the individual and be comforting - Call 911. In most cases, the seizure ends before EMS arrives. However, there are cases when seizures may last over 3 to 5 minutes. Or the individual may have developed  breathing difficulties or severe injuries. If a pregnant patient or a person with diabetes develops a seizure, it is prudent to call an ambulance. - Finally, if the patient does not regain full consciousness, then call EMS. Most patients will remain confused for about 45 to 90 minutes after a seizure, so you must use judgment in calling for help. - Avoid restraints but make sure the patient is in a bed with padded side rails - Place the individual in a lateral position with the neck slightly flexed; this will help the saliva drain from the mouth and prevent the tongue from falling backward - Remove all nearby furniture and other hazards from the area - Provide verbal assurance as the individual is regaining consciousness - Provide the patient with privacy if possible - Call for help and start treatment as ordered by the caregiver    After the Seizure (Postictal Stage)   After a seizure, most patients experience confusion, fatigue, muscle pain and/or a headache. Thus, one should permit the individual to sleep. For the next few days, reassurance is essential. Being calm and helping reorient the person is also of importance.   Most seizures are painless and end spontaneously. Seizures are not harmful to others but can lead to complications such as stress on the lungs, brain and the heart. Individuals with prior lung problems may develop labored breathing and respiratory distress.

## 2024-04-04 NOTE — Progress Notes (Signed)
 Routine EEG completed, results pending Neurology review and interpretation

## 2024-04-05 ENCOUNTER — Other Ambulatory Visit: Payer: Self-pay | Admitting: Physician Assistant

## 2024-04-05 DIAGNOSIS — E1151 Type 2 diabetes mellitus with diabetic peripheral angiopathy without gangrene: Secondary | ICD-10-CM

## 2024-04-05 DIAGNOSIS — I6381 Other cerebral infarction due to occlusion or stenosis of small artery: Secondary | ICD-10-CM

## 2024-04-05 DIAGNOSIS — I6350 Cerebral infarction due to unspecified occlusion or stenosis of unspecified cerebral artery: Secondary | ICD-10-CM | POA: Diagnosis not present

## 2024-04-05 DIAGNOSIS — I6389 Other cerebral infarction: Secondary | ICD-10-CM

## 2024-04-05 DIAGNOSIS — R569 Unspecified convulsions: Secondary | ICD-10-CM | POA: Diagnosis not present

## 2024-04-05 DIAGNOSIS — E785 Hyperlipidemia, unspecified: Secondary | ICD-10-CM

## 2024-04-05 LAB — BASIC METABOLIC PANEL WITH GFR
Anion gap: 12 (ref 5–15)
BUN: 9 mg/dL (ref 8–23)
CO2: 21 mmol/L — ABNORMAL LOW (ref 22–32)
Calcium: 8.8 mg/dL — ABNORMAL LOW (ref 8.9–10.3)
Chloride: 103 mmol/L (ref 98–111)
Creatinine, Ser: 0.91 mg/dL (ref 0.61–1.24)
GFR, Estimated: >60 mL/min (ref >60)
Glucose, Bld: 193 mg/dL — ABNORMAL HIGH (ref 70–99)
Potassium: 4 mmol/L (ref 3.5–5.1)
Sodium: 136 mmol/L (ref 135–145)

## 2024-04-05 LAB — CBC
HCT: 40.1 % (ref 39.0–52.0)
Hemoglobin: 14.6 g/dL (ref 13.0–17.0)
MCH: 31.3 pg (ref 26.0–34.0)
MCHC: 36.4 g/dL — ABNORMAL HIGH (ref 30.0–36.0)
MCV: 86.1 fL (ref 80.0–100.0)
Platelets: 148 K/uL — ABNORMAL LOW (ref 150–400)
RBC: 4.66 MIL/uL (ref 4.22–5.81)
RDW: 12.3 % (ref 11.5–15.5)
WBC: 9.1 K/uL (ref 4.0–10.5)
nRBC: 0 % (ref 0.0–0.2)

## 2024-04-05 LAB — GLUCOSE, CAPILLARY
Glucose-Capillary: 226 mg/dL — ABNORMAL HIGH (ref 70–99)
Glucose-Capillary: 145 mg/dL — ABNORMAL HIGH (ref 70–99)
Glucose-Capillary: 172 mg/dL — ABNORMAL HIGH (ref 70–99)

## 2024-04-05 MED ORDER — METHOCARBAMOL 500 MG PO TABS
500.0000 mg | ORAL_TABLET | Freq: Four times a day (QID) | ORAL | Status: DC | PRN
  Administered 2024-04-07: 500 mg via ORAL
  Filled 2024-04-05 (×2): qty 1

## 2024-04-05 MED ORDER — ACETAMINOPHEN 325 MG PO TABS: 650.0000 mg | ORAL_TABLET | Freq: Four times a day (QID) | ORAL | Status: DC | PRN

## 2024-04-05 MED ORDER — IRBESARTAN 300 MG PO TABS
300.0000 mg | ORAL_TABLET | Freq: Every day | ORAL | Status: DC
  Administered 2024-04-05 – 2024-04-08 (×4): 300 mg via ORAL
  Filled 2024-04-05 (×5): qty 1

## 2024-04-05 MED ORDER — AMLODIPINE BESYLATE 10 MG PO TABS
10.0000 mg | ORAL_TABLET | Freq: Every day | ORAL | Status: DC
  Administered 2024-04-05 – 2024-04-08 (×4): 10 mg via ORAL
  Filled 2024-04-05 (×4): qty 2

## 2024-04-05 MED ORDER — SPIRONOLACTONE 25 MG PO TABS
25.0000 mg | ORAL_TABLET | Freq: Every day | ORAL | Status: DC
Start: 2024-04-05 — End: 2024-04-09
  Administered 2024-04-05 – 2024-04-08 (×4): 25 mg via ORAL
  Filled 2024-04-05 (×5): qty 1

## 2024-04-05 MED ORDER — INSULIN ASPART 100 UNIT/ML IJ SOLN
0.0000 [IU] | Freq: Three times a day (TID) | INTRAMUSCULAR | Status: DC
  Administered 2024-04-05: 3 [IU] via SUBCUTANEOUS
  Administered 2024-04-05: 1 [IU] via SUBCUTANEOUS
  Administered 2024-04-06: 3 [IU] via SUBCUTANEOUS
  Administered 2024-04-06 – 2024-04-07 (×4): 2 [IU] via SUBCUTANEOUS
  Administered 2024-04-07: 3 [IU] via SUBCUTANEOUS
  Administered 2024-04-08: 5 [IU] via SUBCUTANEOUS
  Administered 2024-04-08: 7 [IU] via SUBCUTANEOUS
  Administered 2024-04-08: 5 [IU] via SUBCUTANEOUS

## 2024-04-05 MED ORDER — OXYCODONE HCL 5 MG PO TABS
5.0000 mg | ORAL_TABLET | Freq: Four times a day (QID) | ORAL | Status: DC | PRN
  Administered 2024-04-06 – 2024-04-08 (×2): 5 mg via ORAL
  Filled 2024-04-05 (×2): qty 1

## 2024-04-05 NOTE — Plan of Care (Signed)
  Problem: Clinical Measurements: Goal: Ability to maintain clinical measurements within normal limits will improve Outcome: Progressing Goal: Will remain free from infection Outcome: Progressing Goal: Diagnostic test results will improve Outcome: Progressing Goal: Respiratory complications will improve Outcome: Progressing Goal: Cardiovascular complication will be avoided Outcome: Progressing   Problem: Activity: Goal: Risk for activity intolerance will decrease Outcome: Progressing   Problem: Nutrition: Goal: Adequate nutrition will be maintained Outcome: Progressing   Problem: Pain Managment: Goal: General experience of comfort will improve and/or be controlled Outcome: Progressing   Problem: Safety: Goal: Ability to remain free from injury will improve Outcome: Progressing   Problem: Skin Integrity: Goal: Risk for impaired skin integrity will decrease Outcome: Progressing   Problem: Elimination: Goal: Will not experience complications related to bowel motility Outcome: Progressing Goal: Will not experience complications related to urinary retention Outcome: Progressing

## 2024-04-05 NOTE — Progress Notes (Signed)
 Event monitor ordered for stroke  Dr. Pietro to read

## 2024-04-05 NOTE — Progress Notes (Signed)
 PROGRESS NOTE  Jerome Cardenas  FMW:[id number] DOB: 06/24/1951 DOA: 04/03/2024 PCP: Evangelina Tinnie Norris, PA-C   Brief Narrative: Patient is a 73 year old male with history of hypertension, hyperlipidemia, recurrent stroke, prostate cancer with spinal mets, type 2 diabetes , CVA who was brought to the to the emergency department for new onset seizure.  His wife found him on the floor and witnessed her seizure.  He was postictal afterwards and also had weakness.  On presentation, he was hemodynamically stable.  Lab work showed potassium of 3.1, troponin of 146, D-dimer 3.9.  CTA did not show any PE but also showed  possible mild opacification over the superior segment right lowerlobe which could be due to infection versus atelectasis, although may be due to artifact .  CT head did not show any acute findings.  MRI of the brain showed acute 7 mm nonhemorrhagic infarct within the inferior left lentiform nucleus, 4 mm dural-based lesion extending inferiorly from the left side of the tentorium, most consistent with a meningioma.  Neurology consulted , stroke workup completed.  PT recommend home health discharge.  Family unable to take him today to home, he also has some back pain.  Possible discharge tomorrow after PT follow up  Assessment & Plan:  Principal Problem:   Seizures (HCC) Active Problems:   Acute CVA (cerebrovascular accident) [facility])  Seizure: New onset.  Likely associated with stroke.  Neurology following.Started on keppra.  EEG did not show seizure or epileptiform discharge.  Continue Keppra on discharge.  No driving until seizure-free for 6 months  Acute CVA: History of CVA in the past.  MRI showed cute 7 mm nonhemorrhagic infarct within the inferior left lentiform nucleus.  Initiated the stroke workup.  PT/OT/speech evaluation done.  A1c of 6.1, LDL of 108.  Currently on aspirin  and Plavix .  Plan is to continue DAPT for 3 weeks followed by Plavix  alone .  Echo showed EF of 60 to 65%,  no intracardiac source of emboli.  Neurology recommended 30-day cardiac event monitoring as outpatient to rule out A-fib.  Sent message to [facility] for this and cardiology will arrange it  Hypertension:   Takes amlodipine , spironolactone , olmesartan, metoprolol  at home.  Continue as needed medications for severe hypertension.  Patient remains hypertensive tomorrow, may need to titrate the medications  History of prostate cancer with spinal mets/Back pain: Follows with urology, oncology(Dr. Madison at [facility]).  On Xtandi  and leuprolide every 3 months. As per wife, he complains of back pain after he had the seizure episode.  Does not have any focal deficits.  Added Robaxin, pain medication.  Family requested physical therapy to see him again in the morning before dc, message sent to PT.  Suspected pneumonia: Chest x-ray was clear.  But CT also showed  possible mild opacification over the superior segment right lowerlobe, likely artifact.  Antibiotics discontinued  GERD: Continue PPI  Hypokalemia: Supplemented and corrected  History of hyperlipidemia: Takes fenofibrate , Crestor   History of BPH: Takes finasteride, Flomax   Elevated troponin: Flat trend.  No chest pain.   Likely demand ischemia. Echo did not show any regional wall motion abnormalities  Cognitive impairment: Chronic problem. Continue delirium precaution, frequent orientation         DVT prophylaxis:enoxaparin  (LOVENOX ) injection 40 mg Start: 04/04/24 1000     Code Status: Full Code  Family Communication: Discussed with wife [name] at bedside on 9/28  Patient status: Inpatient  Patient is from : Home  Anticipated discharge to: Home  Estimated DC date: tomorrow after PT evaluation    Consultants: Neuro  Procedures: MRI of the brain  Antimicrobials:  Anti-infectives (From admission, onward)    Start     Dose/Rate Route Frequency Ordered Stop   04/03/24 1330  cefTRIAXone  (ROCEPHIN ) 1 g in sodium  chloride 0.9 % 100 mL IVPB        1 g 200 mL/hr over 30 Minutes Intravenous  Once 04/03/24 1324 04/03/24 1441   04/03/24 1330  azithromycin  (ZITHROMAX ) 500 mg in sodium chloride  0.9 % 250 mL IVPB        500 mg 250 mL/hr over 60 Minutes Intravenous  Once 04/03/24 1324 04/03/24 1508       Subjective: Patient seen and examined the bedside today.  Apparently not in any kind of distress.  Lying in bed.  Mildly hypertensive.  Alert and awake, confused to time.  No focal deficits.  Long discussion held with the wife at bedside.  She is unable to take him home today.  She is concerned that he is unable to walk and has back pain.  We discussed about adding some muscle relaxants and pain medication.  She requested for PT reevaluation in the morning so that she can take him home  Objective: Vitals:   04/04/24 2336 04/05/24 0343 04/05/24 0747 04/05/24 1125  BP: (!) 150/92 (!) 161/90 (!) 166/101 (!) 151/91  Pulse: 92 82 75 73  Resp: 18 17 16 18   Temp: 98.5 F (36.9 C) 98.1 F (36.7 C) 97.6 F (36.4 C) 97.7 F (36.5 C)  TempSrc: Oral Oral Oral Oral  SpO2: 96% 98% 98% 99%  Weight:      Height:       No intake or output data in the 24 hours ending 04/05/24 1432 Filed Weights   04/03/24 0851  Weight: 92.1 kg    Examination:    General exam: Overall comfortable, not in distress, pleasantly confused HEENT: PERRL Respiratory system:  no wheezes or crackles  Cardiovascular system: S1 & S2 heard, RRR.  Gastrointestinal system: Abdomen is nondistended, soft and nontender. Central nervous system: Alert and awake,oriented to place only Extremities: No edema, no clubbing ,no cyanosis Skin: No rashes, no ulcers,no icterus     Data Reviewed: I have personally reviewed following labs and imaging studies  CBC: Recent Labs  Lab 04/03/24 0901 04/05/24 0506  WBC 11.2* 9.1  HGB 15.8 14.6  HCT 43.7 40.1  MCV 85.7 86.1  PLT 164 148*   Basic Metabolic Panel: Recent Labs  Lab 04/03/24 0901  04/03/24 1518 04/04/24 1020 04/05/24 0506  NA 137  --  138 136  K 3.1*  --  3.1* 4.0  CL 101  --  111 103  CO2 20*  --  19* 21*  GLUCOSE 193*  --  174* 193*  BUN 14  --  8 9  CREATININE 1.18  --  0.74 0.91  CALCIUM  9.2  --  7.3* 8.8*  MG  --  1.7  --   --      Recent Results (from the past 240 hours)  Blood culture (routine x 2)     Status: None (Preliminary result)   Collection Time: 04/03/24  3:18 PM   Specimen: BLOOD  Result Value Ref Range Status   Specimen Description BLOOD SITE NOT SPECIFIED  Final   Special Requests   Final    BOTTLES DRAWN AEROBIC AND ANAEROBIC Blood Culture adequate volume   Culture   Final    NO  GROWTH 2 DAYS Performed at [facility], [address], [place], [name] [address]    Report Status PENDING  Incomplete  Blood culture (routine x 2)     Status: None (Preliminary result)   Collection Time: 04/03/24  3:25 PM   Specimen: BLOOD  Result Value Ref Range Status   Specimen Description BLOOD SITE NOT SPECIFIED  Final   Special Requests   Final    BOTTLES DRAWN AEROBIC AND ANAEROBIC Blood Culture adequate volume   Culture   Final    NO GROWTH 2 DAYS Performed at [facility], [address], [place], [name] [address]    Report Status PENDING  Incomplete     Radiology Studies: EEG adult Result Date: 04/04/2024 Matthews Elida HERO, MD     04/04/2024  3:49 PM Routine EEG Report Jerome Cardenas is a 73 y.o. male with a history of seizure who is undergoing an EEG to evaluate for seizures. Report: This EEG was acquired with electrodes placed according to the International 10-20 electrode system (including Fp1, Fp2, F3, F4, C3, C4, P3, P4, O1, O2, T3, T4, T5, T6, A1, A2, Fz, Cz, Pz). The following electrodes were missing or displaced: none. The occipital dominant rhythm was 9 Hz. This activity is reactive to stimulation. Drowsiness was manifested by background fragmentation; deeper stages of sleep were identified by K complexes and sleep  spindles. There was no focal slowing. There were no interictal epileptiform discharges. There were no electrographic seizures identified. Impression: This EEG was obtained while awake and asleep and is normal.   Clinical Correlation: Normal EEGs, however, do not rule out epilepsy. Elida Matthews, MD [facility] [phone] If 7pm- 7am, please page neurology on call as listed in AMION.   ECHOCARDIOGRAM COMPLETE Result Date: 04/04/2024    ECHOCARDIOGRAM REPORT   Patient Name:   Jerome Cardenas [facility] Date of Exam: 04/04/2024 Medical Rec #:  [id number]       Height:       68.0 in Accession #:    [id number]      Weight:       203.0 lb Date of Birth:  January 05, 1951       BSA:          2.057 m Patient Age:    73 years        BP:           138/101 mmHg Patient Gender: M               HR:           100 bpm. Exam Location:  Inpatient Procedure: 2D Echo, Color Doppler, Cardiac Doppler and 3D Echo (Both Spectral            and Color Flow Doppler were utilized during procedure). Indications:    Elevated Troponin  History:        Patient has prior history of Echocardiogram examinations, most                 recent 07/29/2022. Risk Factors:Hypertension and Diabetes.                 Cancer, Chronic Kidney Disease.  Sonographer:    Logan Shove RDCS Referring Phys: [id number] [facility] IMPRESSIONS  1. Left ventricular ejection fraction, by estimation, is 60 to 65%. The left ventricle has normal function. The left ventricle has no regional wall motion abnormalities. Left ventricular diastolic parameters are consistent with Grade I diastolic dysfunction (impaired relaxation).  2. Right ventricular systolic function is normal. The right ventricular size is normal.  3. The mitral valve is normal in structure. No evidence of mitral valve regurgitation. No evidence of mitral stenosis.  4. The aortic valve is tricuspid. Aortic valve regurgitation is trivial. No aortic stenosis is present.  5. The inferior vena cava is normal in size with  greater than 50% respiratory variability, suggesting right atrial pressure of 3 mmHg. FINDINGS  Left Ventricle: Left ventricular ejection fraction, by estimation, is 60 to 65%. The left ventricle has normal function. The left ventricle has no regional wall motion abnormalities. The left ventricular internal cavity size was normal in size. There is  no left ventricular hypertrophy. Left ventricular diastolic parameters are consistent with Grade I diastolic dysfunction (impaired relaxation). Right Ventricle: The right ventricular size is normal. Right ventricular systolic function is normal. Left Atrium: Left atrial size was normal in size. Right Atrium: Right atrial size was normal in size. Pericardium: There is no evidence of pericardial effusion. Mitral Valve: The mitral valve is normal in structure. No evidence of mitral valve regurgitation. No evidence of mitral valve stenosis. Tricuspid Valve: The tricuspid valve is normal in structure. Tricuspid valve regurgitation is trivial. No evidence of tricuspid stenosis. Aortic Valve: The aortic valve is tricuspid. Aortic valve regurgitation is trivial. No aortic stenosis is present. Aortic valve mean gradient measures 4.5 mmHg. Aortic valve peak gradient measures 9.3 mmHg. Aortic valve area, by VTI measures 1.91 cm. Pulmonic Valve: The pulmonic valve was normal in structure. Pulmonic valve regurgitation is not visualized. No evidence of pulmonic stenosis. Aorta: The aortic root is normal in size and structure. Venous: The inferior vena cava is normal in size with greater than 50% respiratory variability, suggesting right atrial pressure of 3 mmHg. IAS/Shunts: No atrial level shunt detected by color flow Doppler. Additional Comments: 3D was performed not requiring image post processing on an independent workstation and was normal.  LEFT VENTRICLE PLAX 2D LVIDd:         4.70 cm LVIDs:         3.30 cm LV PW:         1.00 cm LV IVS:        0.80 cm LVOT diam:     2.20 cm    3D Volume EF: LV SV:         54        3D EF:        56 % LV SV Index:   26        LV EDV:       118 ml LVOT Area:     3.80 cm  LV ESV:       52 ml                          LV SV:        66 ml RIGHT VENTRICLE             IVC RV Basal diam:  2.30 cm     IVC diam: 1.70 cm RV S prime:     18.42 cm/s TAPSE (M-mode): 2.0 cm LEFT ATRIUM             Index        RIGHT ATRIUM          Index LA diam:        3.40 cm 1.65 cm/m   RA Area:     9.44  cm LA Vol (A2C):   52.8 ml 25.67 ml/m  RA Volume:   16.40 ml 7.97 ml/m LA Vol (A4C):   66.7 ml 32.43 ml/m LA Biplane Vol: 63.8 ml 31.02 ml/m  AORTIC VALVE AV Area (Vmax):    1.86 cm AV Area (Vmean):   1.86 cm AV Area (VTI):     1.91 cm AV Vmax:           152.50 cm/s AV Vmean:          99.300 cm/s AV VTI:            0.280 m AV Peak Grad:      9.3 mmHg AV Mean Grad:      4.5 mmHg LVOT Vmax:         74.50 cm/s LVOT Vmean:        48.600 cm/s LVOT VTI:          0.141 m LVOT/AV VTI ratio: 0.50  AORTA Ao Root diam: 3.20 cm Ao Asc diam:  3.50 cm  SHUNTS Systemic VTI:  0.14 m Systemic Diam: 2.20 cm Redell Shallow MD Electronically signed by Redell Shallow MD Signature Date/Time: 04/04/2024/1:14:12 PM    Final    CT ANGIO HEAD NECK W WO CM Result Date: 04/04/2024 CLINICAL DATA:  73 year old male with evidence of left MCA territory lacunar infarct on MRI yesterday. Neurologic deficit, seizure. 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 EXAM: CT ANGIOGRAPHY HEAD AND NECK WITH AND WITHOUT CONTRAST TECHNIQUE: Multidetector CT imaging of the head and neck was performed using the standard protocol during bolus administration of intravenous contrast. Multiplanar CT image reconstructions and MIPs were obtained to evaluate the vascular anatomy. Carotid stenosis measurements (when applicable) are obtained utilizing NASCET criteria, using the distal internal carotid diameter as the denominator. RADIATION DOSE REDUCTION: This exam was performed according to the departmental  dose-optimization program which includes automated exposure control, adjustment of the mA and/or kV according to patient size and/or use of iterative reconstruction technique. CONTRAST:  75mL OMNIPAQUE  IOHEXOL  350 MG/ML SOLN COMPARISON:  Brain MRI yesterday.  Head CT yesterday. CTA head and neck 07/29/2022. FINDINGS: CT HEAD Brain: Stable non contrast CT appearance of the brain. Chronic small vessel disease including left thalamic lacunar infarct, bilateral white matter hypodensity. Suspected left MCA territory lacune and small left posterior fossa meningioma occult by CT. No acute intracranial hemorrhage or mass effect. Calvarium and skull base: Stable and intact. Paranasal sinuses: Visualized paranasal sinuses and mastoids are stable and well aerated. Orbits: Small left forehead benign scalp lipoma.  No gaze deviation. CTA NECK Skeleton: Chronic cervical spine degeneration. No acute osseous abnormality identified. Upper chest: Dense left subclavian and innominate venous contrast streak artifact. Otherwise negative. Other neck: Nonvascular neck soft tissue spaces appears stable from last year and within normal limits. Aortic arch: Calcified arch atherosclerosis. Three vessel arch configuration. Right carotid system: Patent with chronic brachiocephalic artery and right CCA tortuosity. Mild soft and calcified plaque at the proximal right ICA without stenosis. Left carotid system: Patent with tortuosity, mild soft plaque in the left CCA. Minimal plaque at the left carotid bifurcation. No stenosis. Vertebral arteries: Tortuous proximal right subclavian artery without stenosis. Tortuous right vertebral artery origin with perhaps mild soft plaque but no stenosis. Patent right vertebral artery to the skull base without stenosis. Proximal left subclavian artery and left vertebral artery origin appear normal. Possible mild V1 segment plaque. Mildly dominant appearance of the left vertebral artery. No stenosis to the skull  base.  CTA HEAD Posterior circulation: Patent distal vertebral arteries and vertebrobasilar junction. Chronically dominant appearance of the left V4 segment, although fairly normal caliber. Chronically atherosclerotic and non dominant appearance of the distal right vertebral artery which remains patent. Mild left V4 plaque also. No hemodynamically significant distal vertebral stenosis. Patent basilar artery with mild irregularity. Fetal left PCA origin. Distal basilar and right PCA origin remain patent. Bilateral PCAs are patent, and bilateral PCA irregularity is less pronounced compared to the previous CTA. Up to moderate irregularity now right PCA P1/P2 junction and bilateral P3 segments (series 14, image 21). Anterior circulation: Both ICA siphons are patent. Mildly tortuous left siphon with mild calcified plaque and no stenosis. Normal left posterior communicating artery origin. Mild tortuosity of the right siphon. Mild to moderate right siphon calcified plaque without stenosis. Patent carotid termini. Patent MCA and ACA origins. Mildly dominant left A1. Diminutive or absent anterior communicating artery. Bilateral ACA branches are patent with increased mild to moderate distal A2 segment irregularity compared to last year (series 13, image 22). MCA M1 segments and MCA bifurcations are patent. Left MCA distal M1, bifurcation, and proximal M2 mild to moderate irregularity appears stable on series 14, image 21. Comparatively mild right MCA bifurcation irregularity. MCA branches appear stable with mild irregularity bilaterally. Venous sinuses: Early contrast timing, not evaluated. Anatomic variants: Mildly dominant left vertebral artery, left ACA A1, fetal type left PCA origin. Review of the MIP images confirms the above findings IMPRESSION: 1. Negative for large vessel occlusion. Chronic extracranial artery tortuosity but little extracranial atherosclerosis. Chronically more advanced Intracranial Atherosclerosis.  Notable for: - increased ACA A2 segment irregularity and stenosis, mild-to-moderate. - stable left > right MCA segment irregularity and stenosis, mild-to-moderate. - improved appearance of bilateral PCA irregularity and stenosis, now moderate. 2. Stable non contrast CT appearance of the brain. 3.  Aortic Atherosclerosis (ICD10-I70.0). Electronically Signed   By: VEAR Hurst M.D.   On: 04/04/2024 07:43   MR Brain W and Wo Contrast Result Date: 04/03/2024 EXAM: MRI BRAIN WITH AND WITHOUT CONTRAST 04/03/2024 06:26:19 PM TECHNIQUE: Multiplanar multisequence MRI of the head/brain was performed with and without the administration of 9.2 mL gadobutrol  (GADAVIST ) 1 MMOL/ML intravenous contrast. COMPARISON: CT head without contrast 04/03/2024. MR head without contrast 07/28/2022. CLINICAL HISTORY: Brain/CNS neoplasm, staging; Prostate CA, new onset sz. Best images due to pt motion. FINDINGS: BRAIN AND VENTRICLES: An acute 7 mm nonhemorrhagic infarct is present within the inferior left lentiform nucleus. The previously noted left thalamic infarct demonstrates expected evolution. Confluent periventricular white matter changes are moderately advanced for age. This most likely reflects the sequelae of chronic microvascular ischemia. Mild white matter changes extend into the brainstem. A 4 mm dural-based lesion extends inferiorly from the left side of the tentorium, most consistent with a meningioma. No other pathologic enhancement is present. No acute intracranial hemorrhage. No mass effect or midline shift. No hydrocephalus. The sella is unremarkable. Normal flow voids. ORBITS: No acute abnormality. SINUSES: No acute abnormality. BONES AND SOFT TISSUES: Normal bone marrow signal and enhancement. No acute soft tissue abnormality. IMPRESSION: 1. Acute 7 mm nonhemorrhagic infarct within the inferior left lentiform nucleus. 2. 4 mm dural-based lesion extending inferiorly from the left side of the tentorium, most consistent with a  meningioma. 3. Moderately advanced confluent periventricular white matter changes, likely sequelae of chronic microvascular ischemia. Critical value findings were called to Dr. Deretha at 6:47 pm Electronically signed by: Lonni Necessary MD 04/03/2024 06:50 PM EDT RP Workstation: [id number]  Scheduled Meds:  amLODipine   10 mg Oral Daily   aspirin  EC  81 mg Oral Daily   clopidogrel   75 mg Oral Daily   DULoxetine   60 mg Oral Daily   enoxaparin  (LOVENOX ) injection  40 mg Subcutaneous Q24H   fenofibrate   54 mg Oral Daily   insulin aspart  0-9 Units Subcutaneous TID WC   irbesartan   300 mg Oral Daily   levETIRAcetam  500 mg Oral BID   metoprolol  succinate  75 mg Oral Daily   pantoprazole   40 mg Oral Daily   rosuvastatin   40 mg Oral Daily   spironolactone   25 mg Oral Daily   topiramate   25 mg Oral BID   Continuous Infusions:   LOS: 2 days   Ivonne Mustache, MD [facility] P9/28/2025, 2:32 PM  PROGRESS NOTE  Jerome Cardenas  FMW:[id number] DOB: 01-29-1951 DOA: 04/03/2024 PCP: Evangelina Tinnie Norris, PA-C   Brief Narrative:   Assessment & Plan:  Principal Problem:   Seizures (HCC) Active Problems:   Acute CVA (cerebrovascular accident) (HCC)           DVT prophylaxis:enoxaparin  (LOVENOX ) injection 40 mg Start: 04/04/24 1000     Code Status: Full Code  Family Communication:   Patient status:  Patient is from :  Anticipated discharge to:  Estimated DC date:   Consultants:   Procedures:  Antimicrobials:  Anti-infectives (From admission, onward)    Start     Dose/Rate Route Frequency Ordered Stop   04/03/24 1330  cefTRIAXone  (ROCEPHIN ) 1 g in sodium chloride  0.9 % 100 mL IVPB        1 g 200 mL/hr over 30 Minutes Intravenous  Once 04/03/24 1324 04/03/24 1441   04/03/24 1330  azithromycin  (ZITHROMAX ) 500 mg in sodium chloride  0.9 % 250 mL IVPB        500 mg 250 mL/hr over 60 Minutes Intravenous  Once 04/03/24 1324 04/03/24 1508        Subjective:   Objective: Vitals:   04/04/24 2336 04/05/24 0343 04/05/24 0747 04/05/24 1125  BP: (!) 150/92 (!) 161/90 (!) 166/101 (!) 151/91  Pulse: 92 82 75 73  Resp: 18 17 16 18   Temp: 98.5 F (36.9 C) 98.1 F (36.7 C) 97.6 F (36.4 C) 97.7 F (36.5 C)  TempSrc: Oral Oral Oral Oral  SpO2: 96% 98% 98% 99%  Weight:      Height:       No intake or output data in the 24 hours ending 04/05/24 1432 Filed Weights   04/03/24 0851  Weight: 92.1 kg    Examination:  General exam: Overall comfortable, not in distress HEENT: PERRL Respiratory system:  no wheezes or crackles  Cardiovascular system: S1 & S2 heard, RRR.  Gastrointestinal system: Abdomen is nondistended, soft and nontender. Central nervous system: Alert and oriented Extremities: No edema, no clubbing ,no cyanosis Skin: No rashes, no ulcers,no icterus     Data Reviewed: I have personally reviewed following labs and imaging studies  CBC: Recent Labs  Lab 04/03/24 0901 04/05/24 0506  WBC 11.2* 9.1  HGB 15.8 14.6  HCT 43.7 40.1  MCV 85.7 86.1  PLT 164 148*   Basic Metabolic Panel: Recent Labs  Lab 04/03/24 0901 04/03/24 1518 04/04/24 1020 04/05/24 0506  NA 137  --  138 136  K 3.1*  --  3.1* 4.0  CL 101  --  111 103  CO2 20*  --  19* 21*  GLUCOSE 193*  --  174* 193*  BUN 14  --  8 9  CREATININE 1.18  --  0.74 0.91  CALCIUM  9.2  --  7.3* 8.8*  MG  --  1.7  --   --      Recent Results (from the past 240 hours)  Blood culture (routine x 2)     Status: None (Preliminary result)   Collection Time: 04/03/24  3:18 PM   Specimen: BLOOD  Result Value Ref Range Status   Specimen Description BLOOD SITE NOT SPECIFIED  Final   Special Requests   Final    BOTTLES DRAWN AEROBIC AND ANAEROBIC Blood Culture adequate volume   Culture   Final    NO GROWTH 2 DAYS Performed at [facility], [address], [place], [name] [address]    Report Status PENDING  Incomplete  Blood culture (routine x 2)      Status: None (Preliminary result)   Collection Time: 04/03/24  3:25 PM   Specimen: BLOOD  Result Value Ref Range Status   Specimen Description BLOOD SITE NOT SPECIFIED  Final   Special Requests   Final    BOTTLES DRAWN AEROBIC AND ANAEROBIC Blood Culture adequate volume   Culture   Final    NO GROWTH 2 DAYS Performed at [facility], [address], [place], [name] [address]    Report Status PENDING  Incomplete     Radiology Studies: EEG adult Result Date: 04/04/2024 Matthews Elida HERO, MD     04/04/2024  3:49 PM Routine EEG Report Jerome Cardenas is a 73 y.o. male with a history of seizure who is undergoing an EEG to evaluate for seizures. Report: This EEG was acquired with electrodes placed according to the International 10-20 electrode system (including Fp1, Fp2, F3, F4, C3, C4, P3, P4, O1, O2, T3, T4, T5, T6, A1, A2, Fz, Cz, Pz). The following electrodes were missing or displaced: none. The occipital dominant rhythm was 9 Hz. This activity is reactive to stimulation. Drowsiness was manifested by background fragmentation; deeper stages of sleep were identified by K complexes and sleep spindles. There was no focal slowing. There were no interictal epileptiform discharges. There were no electrographic seizures identified. Impression: This EEG was obtained while awake and asleep and is normal.   Clinical Correlation: Normal EEGs, however, do not rule out epilepsy. Elida Matthews, MD [facility] [phone] If 7pm- 7am, please page neurology on call as listed in AMION.   ECHOCARDIOGRAM COMPLETE Result Date: 04/04/2024    ECHOCARDIOGRAM REPORT   Patient Name:   AHMAR PICKRELL [facility] Date of Exam: 04/04/2024 Medical Rec #:  [id number]       Height:       68.0 in Accession #:    [id number]      Weight:       203.0 lb Date of Birth:  11/26/1950       BSA:          2.057 m Patient Age:    73 years        BP:           138/101 mmHg Patient Gender: M               HR:           100 bpm. Exam  Location:  Inpatient Procedure: 2D Echo, Color Doppler, Cardiac Doppler and 3D Echo (Both Spectral            and Color Flow Doppler were utilized during procedure). Indications:  Elevated Troponin  History:        Patient has prior history of Echocardiogram examinations, most                 recent 07/29/2022. Risk Factors:Hypertension and Diabetes.                 Cancer, Chronic Kidney Disease.  Sonographer:    Logan Shove RDCS Referring Phys: [id number] [facility] IMPRESSIONS  1. Left ventricular ejection fraction, by estimation, is 60 to 65%. The left ventricle has normal function. The left ventricle has no regional wall motion abnormalities. Left ventricular diastolic parameters are consistent with Grade I diastolic dysfunction (impaired relaxation).  2. Right ventricular systolic function is normal. The right ventricular size is normal.  3. The mitral valve is normal in structure. No evidence of mitral valve regurgitation. No evidence of mitral stenosis.  4. The aortic valve is tricuspid. Aortic valve regurgitation is trivial. No aortic stenosis is present.  5. The inferior vena cava is normal in size with greater than 50% respiratory variability, suggesting right atrial pressure of 3 mmHg. FINDINGS  Left Ventricle: Left ventricular ejection fraction, by estimation, is 60 to 65%. The left ventricle has normal function. The left ventricle has no regional wall motion abnormalities. The left ventricular internal cavity size was normal in size. There is  no left ventricular hypertrophy. Left ventricular diastolic parameters are consistent with Grade I diastolic dysfunction (impaired relaxation). Right Ventricle: The right ventricular size is normal. Right ventricular systolic function is normal. Left Atrium: Left atrial size was normal in size. Right Atrium: Right atrial size was normal in size. Pericardium: There is no evidence of pericardial effusion. Mitral Valve: The mitral valve is normal in structure. No  evidence of mitral valve regurgitation. No evidence of mitral valve stenosis. Tricuspid Valve: The tricuspid valve is normal in structure. Tricuspid valve regurgitation is trivial. No evidence of tricuspid stenosis. Aortic Valve: The aortic valve is tricuspid. Aortic valve regurgitation is trivial. No aortic stenosis is present. Aortic valve mean gradient measures 4.5 mmHg. Aortic valve peak gradient measures 9.3 mmHg. Aortic valve area, by VTI measures 1.91 cm. Pulmonic Valve: The pulmonic valve was normal in structure. Pulmonic valve regurgitation is not visualized. No evidence of pulmonic stenosis. Aorta: The aortic root is normal in size and structure. Venous: The inferior vena cava is normal in size with greater than 50% respiratory variability, suggesting right atrial pressure of 3 mmHg. IAS/Shunts: No atrial level shunt detected by color flow Doppler. Additional Comments: 3D was performed not requiring image post processing on an independent workstation and was normal.  LEFT VENTRICLE PLAX 2D LVIDd:         4.70 cm LVIDs:         3.30 cm LV PW:         1.00 cm LV IVS:        0.80 cm LVOT diam:     2.20 cm   3D Volume EF: LV SV:         54        3D EF:        56 % LV SV Index:   26        LV EDV:       118 ml LVOT Area:     3.80 cm  LV ESV:       52 ml  LV SV:        66 ml RIGHT VENTRICLE             IVC RV Basal diam:  2.30 cm     IVC diam: 1.70 cm RV S prime:     18.42 cm/s TAPSE (M-mode): 2.0 cm LEFT ATRIUM             Index        RIGHT ATRIUM          Index LA diam:        3.40 cm 1.65 cm/m   RA Area:     9.44 cm LA Vol (A2C):   52.8 ml 25.67 ml/m  RA Volume:   16.40 ml 7.97 ml/m LA Vol (A4C):   66.7 ml 32.43 ml/m LA Biplane Vol: 63.8 ml 31.02 ml/m  AORTIC VALVE AV Area (Vmax):    1.86 cm AV Area (Vmean):   1.86 cm AV Area (VTI):     1.91 cm AV Vmax:           152.50 cm/s AV Vmean:          99.300 cm/s AV VTI:            0.280 m AV Peak Grad:      9.3 mmHg AV Mean Grad:       4.5 mmHg LVOT Vmax:         74.50 cm/s LVOT Vmean:        48.600 cm/s LVOT VTI:          0.141 m LVOT/AV VTI ratio: 0.50  AORTA Ao Root diam: 3.20 cm Ao Asc diam:  3.50 cm  SHUNTS Systemic VTI:  0.14 m Systemic Diam: 2.20 cm Redell Shallow MD Electronically signed by Redell Shallow MD Signature Date/Time: 04/04/2024/1:14:12 PM    Final    CT ANGIO HEAD NECK W WO CM Result Date: 04/04/2024 CLINICAL DATA:  73 year old male with evidence of left MCA territory lacunar infarct on MRI yesterday. Neurologic deficit, seizure. 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 EXAM: CT ANGIOGRAPHY HEAD AND NECK WITH AND WITHOUT CONTRAST TECHNIQUE: Multidetector CT imaging of the head and neck was performed using the standard protocol during bolus administration of intravenous contrast. Multiplanar CT image reconstructions and MIPs were obtained to evaluate the vascular anatomy. Carotid stenosis measurements (when applicable) are obtained utilizing NASCET criteria, using the distal internal carotid diameter as the denominator. RADIATION DOSE REDUCTION: This exam was performed according to the departmental dose-optimization program which includes automated exposure control, adjustment of the mA and/or kV according to patient size and/or use of iterative reconstruction technique. CONTRAST:  75mL OMNIPAQUE  IOHEXOL  350 MG/ML SOLN COMPARISON:  Brain MRI yesterday.  Head CT yesterday. CTA head and neck 07/29/2022. FINDINGS: CT HEAD Brain: Stable non contrast CT appearance of the brain. Chronic small vessel disease including left thalamic lacunar infarct, bilateral white matter hypodensity. Suspected left MCA territory lacune and small left posterior fossa meningioma occult by CT. No acute intracranial hemorrhage or mass effect. Calvarium and skull base: Stable and intact. Paranasal sinuses: Visualized paranasal sinuses and mastoids are stable and well aerated. Orbits: Small left forehead benign scalp lipoma.  No gaze  deviation. CTA NECK Skeleton: Chronic cervical spine degeneration. No acute osseous abnormality identified. Upper chest: Dense left subclavian and innominate venous contrast streak artifact. Otherwise negative. Other neck: Nonvascular neck soft tissue spaces appears stable from last year and within normal limits. Aortic arch: Calcified arch atherosclerosis. Three vessel arch  configuration. Right carotid system: Patent with chronic brachiocephalic artery and right CCA tortuosity. Mild soft and calcified plaque at the proximal right ICA without stenosis. Left carotid system: Patent with tortuosity, mild soft plaque in the left CCA. Minimal plaque at the left carotid bifurcation. No stenosis. Vertebral arteries: Tortuous proximal right subclavian artery without stenosis. Tortuous right vertebral artery origin with perhaps mild soft plaque but no stenosis. Patent right vertebral artery to the skull base without stenosis. Proximal left subclavian artery and left vertebral artery origin appear normal. Possible mild V1 segment plaque. Mildly dominant appearance of the left vertebral artery. No stenosis to the skull base. CTA HEAD Posterior circulation: Patent distal vertebral arteries and vertebrobasilar junction. Chronically dominant appearance of the left V4 segment, although fairly normal caliber. Chronically atherosclerotic and non dominant appearance of the distal right vertebral artery which remains patent. Mild left V4 plaque also. No hemodynamically significant distal vertebral stenosis. Patent basilar artery with mild irregularity. Fetal left PCA origin. Distal basilar and right PCA origin remain patent. Bilateral PCAs are patent, and bilateral PCA irregularity is less pronounced compared to the previous CTA. Up to moderate irregularity now right PCA P1/P2 junction and bilateral P3 segments (series 14, image 21). Anterior circulation: Both ICA siphons are patent. Mildly tortuous left siphon with mild calcified  plaque and no stenosis. Normal left posterior communicating artery origin. Mild tortuosity of the right siphon. Mild to moderate right siphon calcified plaque without stenosis. Patent carotid termini. Patent MCA and ACA origins. Mildly dominant left A1. Diminutive or absent anterior communicating artery. Bilateral ACA branches are patent with increased mild to moderate distal A2 segment irregularity compared to last year (series 13, image 22). MCA M1 segments and MCA bifurcations are patent. Left MCA distal M1, bifurcation, and proximal M2 mild to moderate irregularity appears stable on series 14, image 21. Comparatively mild right MCA bifurcation irregularity. MCA branches appear stable with mild irregularity bilaterally. Venous sinuses: Early contrast timing, not evaluated. Anatomic variants: Mildly dominant left vertebral artery, left ACA A1, fetal type left PCA origin. Review of the MIP images confirms the above findings IMPRESSION: 1. Negative for large vessel occlusion. Chronic extracranial artery tortuosity but little extracranial atherosclerosis. Chronically more advanced Intracranial Atherosclerosis. Notable for: - increased ACA A2 segment irregularity and stenosis, mild-to-moderate. - stable left > right MCA segment irregularity and stenosis, mild-to-moderate. - improved appearance of bilateral PCA irregularity and stenosis, now moderate. 2. Stable non contrast CT appearance of the brain. 3.  Aortic Atherosclerosis (ICD10-I70.0). Electronically Signed   By: VEAR Hurst M.D.   On: 04/04/2024 07:43   MR Brain W and Wo Contrast Result Date: 04/03/2024 EXAM: MRI BRAIN WITH AND WITHOUT CONTRAST 04/03/2024 06:26:19 PM TECHNIQUE: Multiplanar multisequence MRI of the head/brain was performed with and without the administration of 9.2 mL gadobutrol  (GADAVIST ) 1 MMOL/ML intravenous contrast. COMPARISON: CT head without contrast 04/03/2024. MR head without contrast 07/28/2022. CLINICAL HISTORY: Brain/CNS neoplasm,  staging; Prostate CA, new onset sz. Best images due to pt motion. FINDINGS: BRAIN AND VENTRICLES: An acute 7 mm nonhemorrhagic infarct is present within the inferior left lentiform nucleus. The previously noted left thalamic infarct demonstrates expected evolution. Confluent periventricular white matter changes are moderately advanced for age. This most likely reflects the sequelae of chronic microvascular ischemia. Mild white matter changes extend into the brainstem. A 4 mm dural-based lesion extends inferiorly from the left side of the tentorium, most consistent with a meningioma. No other pathologic enhancement is present. No acute intracranial hemorrhage. No mass  effect or midline shift. No hydrocephalus. The sella is unremarkable. Normal flow voids. ORBITS: No acute abnormality. SINUSES: No acute abnormality. BONES AND SOFT TISSUES: Normal bone marrow signal and enhancement. No acute soft tissue abnormality. IMPRESSION: 1. Acute 7 mm nonhemorrhagic infarct within the inferior left lentiform nucleus. 2. 4 mm dural-based lesion extending inferiorly from the left side of the tentorium, most consistent with a meningioma. 3. Moderately advanced confluent periventricular white matter changes, likely sequelae of chronic microvascular ischemia. Critical value findings were called to Dr. Deretha at 6:47 pm Electronically signed by: Lonni Necessary MD 04/03/2024 06:50 PM EDT RP Workstation: [id number]    Scheduled Meds:  amLODipine   10 mg Oral Daily   aspirin  EC  81 mg Oral Daily   clopidogrel   75 mg Oral Daily   DULoxetine   60 mg Oral Daily   enoxaparin  (LOVENOX ) injection  40 mg Subcutaneous Q24H   fenofibrate   54 mg Oral Daily   insulin aspart  0-9 Units Subcutaneous TID WC   irbesartan   300 mg Oral Daily   levETIRAcetam  500 mg Oral BID   metoprolol  succinate  75 mg Oral Daily   pantoprazole   40 mg Oral Daily   rosuvastatin   40 mg Oral Daily   spironolactone   25 mg Oral Daily   topiramate   25 mg  Oral BID   Continuous Infusions:   LOS: 2 days   Ivonne Mustache, MD [facility] P9/28/2025, 2:32 PM

## 2024-04-05 NOTE — Evaluation (Signed)
 Physical Therapy Evaluation Patient Details Name: Jerome Cardenas MRN: [id number] DOB: June 07, 1951 Today's Date: 04/05/2024  History of Present Illness  73 yo male presents to [facility] on 9/26 with possible seizure activity at home. Of note, fall on 7/30 with head trauma and +LOC. MRI brain shows left BG small infarct and left parietal occipital punctate periventricular infarct. Being treated for aspiration PNA. PMH includes HTN, HLD, recurrent prostate cancer, DMII, L thalamic cva.   Clinical Impression  Pt presents with generalized weakness, moderate back pain, impaired balance, and decreased activity tolerance. Pt to benefit from acute PT to address deficits. Pt ambulated good hallway distance with use of RW, shuffling gait and forward trunk bias noted but per wife this is baseline. Pt states back pain recently started, day of seizure, and pt's wife is concerned about this. PT anticipates good recovery post-acutely with HH services. PT to progress mobility as tolerated, and will continue to follow acutely.          If plan is discharge home, recommend the following: A little help with walking and/or transfers;A little help with bathing/dressing/bathroom   Can travel by private vehicle        Equipment Recommendations Rolling walker (2 wheels)  Recommendations for Other Services       Functional Status Assessment Patient has had a recent decline in their functional status and demonstrates the ability to make significant improvements in function in a reasonable and predictable amount of time.     Precautions / Restrictions Precautions Precautions: Fall Precaution/Restrictions Comments: seizure precautions Restrictions Weight Bearing Restrictions Per Provider Order: No      Mobility  Bed Mobility Overal bed mobility: Needs Assistance Bed Mobility: Supine to Sit     Supine to sit: HOB elevated, Used rails, Min assist     General bed mobility comments: assist for completion of  trunk elevation, supporting trunk while pt scoots towards EOB.    Transfers Overall transfer level: Needs assistance Equipment used: Rolling walker (2 wheels) Transfers: Sit to/from Stand Sit to Stand: Min assist, Contact guard assist           General transfer comment: initial rise requiring light power up assist, second stand close guard for safety. Cues for correct hand placement when standing and sitting.    Ambulation/Gait Ambulation/Gait assistance: Contact guard assist Gait Distance (Feet): 150 Feet Assistive device: Rolling walker (2 wheels) Gait Pattern/deviations: Step-through pattern, Decreased stride length, Shuffle, Trunk flexed Gait velocity: decr     General Gait Details: close guard for safety, cues for taking bigger steps and placement in RW.  Stairs            Wheelchair Mobility     Tilt Bed    Modified Rankin (Stroke Patients Only)       Balance Overall balance assessment: Needs assistance Sitting-balance support: Feet supported, No upper extremity supported Sitting balance-Leahy Scale: Fair     Standing balance support: Bilateral upper extremity supported, During functional activity, Reliant on assistive device for balance Standing balance-Leahy Scale: Poor                               Pertinent Vitals/Pain Pain Assessment Pain Assessment: Faces Faces Pain Scale: Hurts little more Pain Location: back Pain Descriptors / Indicators: Spasm (catching) Pain Intervention(s): Limited activity within patient's tolerance, Monitored during session, Repositioned    Home Living Family/patient expects to be discharged to:: Private residence Living Arrangements: Spouse/significant other  Available Help at Discharge: Family Type of Home: House Home Access: Stairs to enter   Secretary/administrator of Steps: 1 Alternate Level Stairs-Number of Steps: flight - but installed stair lifts Home Layout: Two level Home Equipment: Cane  - single point;Rollator (4 wheels)      Prior Function Prior Level of Function : Independent/Modified Independent;Driving             Mobility Comments: uses cane in the house, rollator if he goes anywhere ADLs Comments: assist from wife as needed, per chart review pt with cognitive changes     Extremity/Trunk Assessment   Upper Extremity Assessment Upper Extremity Assessment: Defer to OT evaluation    Lower Extremity Assessment Lower Extremity Assessment: Generalized weakness    Cervical / Trunk Assessment Cervical / Trunk Assessment: Kyphotic  Communication   Communication Communication: Impaired Factors Affecting Communication: Other (comment) (very soft spoken)    Cognition Arousal: Alert Behavior During Therapy: Flat affect   PT - Cognitive impairments: History of cognitive impairments, Problem solving, Safety/Judgement, Sequencing                       PT - Cognition Comments: sequencing assist needed especially during bed mobility, slowed processing. Per chart review, wife states pt has been having cognitive changes Following commands: Impaired Following commands impaired: Follows one step commands with increased time     Cueing Cueing Techniques: Verbal cues, Gestural cues     General Comments      Exercises     Assessment/Plan    PT Assessment Patient needs continued PT services  PT Problem List Decreased strength;Decreased mobility;Decreased activity tolerance;Decreased balance;Decreased knowledge of use of DME;Pain;Cardiopulmonary status limiting activity;Decreased coordination       PT Treatment Interventions DME instruction;Therapeutic activities;Gait training;Therapeutic exercise;Patient/family education;Balance training;Stair training;Functional mobility training;Neuromuscular re-education    PT Goals (Current goals can be found in the Care Plan section)  Acute Rehab PT Goals PT Goal Formulation: With patient/family Time For Goal  Achievement: 04/19/24 Potential to Achieve Goals: Good    Frequency Min 2X/week     Co-evaluation               AM-PAC PT 6 Clicks Mobility  Outcome Measure Help needed turning from your back to your side while in a flat bed without using bedrails?: A Little Help needed moving from lying on your back to sitting on the side of a flat bed without using bedrails?: A Little Help needed moving to and from a bed to a chair (including a wheelchair)?: A Little Help needed standing up from a chair using your arms (e.g., wheelchair or bedside chair)?: A Little Help needed to walk in hospital room?: A Little Help needed climbing 3-5 steps with a railing? : A Little 6 Click Score: 18    End of Session Equipment Utilized During Treatment: Gait belt Activity Tolerance: Patient tolerated treatment well Patient left: in chair;with chair alarm set;with call bell/phone within reach;with family/visitor present Nurse Communication: Mobility status PT Visit Diagnosis: Other abnormalities of gait and mobility (R26.89);Muscle weakness (generalized) (M62.81)    Time: 8862-8785 PT Time Calculation (min) (ACUTE ONLY): 37 min   Charges:   PT Evaluation $PT Eval Low Complexity: 1 Low PT Treatments $Gait Training: 8-22 mins PT General Charges $$ ACUTE PT VISIT: 1 Visit         Johana RAMAN, PT DPT Acute Rehabilitation Services Secure Chat Preferred  Office [phone]   Aleksi Brummet E Johna 04/05/2024, 1:33 PM

## 2024-04-05 NOTE — Progress Notes (Signed)
 STROKE TEAM PROGRESS NOTE   SUBJECTIVE (INTERVAL HISTORY) His wife is at the bedside.  Pt lying in bed, seems more awake alert than yesterday. Still not orientated to age but otherwise orientated. Walked with PT and OT, more shuffling than baseline per wife. Also pt complained back pain more severe than baseline.    OBJECTIVE Temp:  [97.6 F (36.4 C)-99.1 F (37.3 C)] 97.7 F (36.5 C) (09/28 1125) Pulse Rate:  [73-92] 73 (09/28 1125) Cardiac Rhythm: Normal sinus rhythm (09/28 0701) Resp:  [16-18] 18 (09/28 1125) BP: (141-183)/(90-104) 151/91 (09/28 1125) SpO2:  [93 %-99 %] 99 % (09/28 1125)  Recent Labs  Lab 04/03/24 0856 04/05/24 1123  GLUCAP 199* 226*   Recent Labs  Lab 04/03/24 0901 04/03/24 1518 04/04/24 1020 04/05/24 0506  NA 137  --  138 136  K 3.1*  --  3.1* 4.0  CL 101  --  111 103  CO2 20*  --  19* 21*  GLUCOSE 193*  --  174* 193*  BUN 14  --  8 9  CREATININE 1.18  --  0.74 0.91  CALCIUM  9.2  --  7.3* 8.8*  MG  --  1.7  --   --    No results for input(s): AST, ALT, ALKPHOS, BILITOT, PROT, ALBUMIN in the last 168 hours. Recent Labs  Lab 04/03/24 0901 04/05/24 0506  WBC 11.2* 9.1  HGB 15.8 14.6  HCT 43.7 40.1  MCV 85.7 86.1  PLT 164 148*   No results for input(s): CKTOTAL, CKMB, CKMBINDEX, TROPONINI in the last 168 hours. No results for input(s): LABPROT, INR in the last 72 hours. Recent Labs    04/04/24 0824  COLORURINE YELLOW  LABSPEC 1.045*  PHURINE 5.0  GLUCOSEU NEGATIVE  HGBUR SMALL*  BILIRUBINUR NEGATIVE  KETONESUR NEGATIVE  PROTEINUR 30*  NITRITE NEGATIVE  LEUKOCYTESUR NEGATIVE       Component Value Date/Time   CHOL 159 04/03/2024 1704   TRIG 100 04/03/2024 1704   HDL 31 (L) 04/03/2024 1704   CHOLHDL 5.1 04/03/2024 1704   VLDL 20 04/03/2024 1704   LDLCALC 108 (H) 04/03/2024 1704   Lab Results  Component Value Date   HGBA1C 6.1 (H) 04/03/2024   No results found for: LABOPIA, COCAINSCRNUR,  LABBENZ, AMPHETMU, THCU, LABBARB  No results for input(s): ETH in the last 168 hours.  I have personally reviewed the radiological images below and agree with the radiology interpretations.  EEG adult Result Date: 04/04/2024 Matthews Elida HERO, MD     04/04/2024  3:49 PM Routine EEG Report Jerome Cardenas is a 73 y.o. male with a history of seizure who is undergoing an EEG to evaluate for seizures. Report: This EEG was acquired with electrodes placed according to the International 10-20 electrode system (including Fp1, Fp2, F3, F4, C3, C4, P3, P4, O1, O2, T3, T4, T5, T6, A1, A2, Fz, Cz, Pz). The following electrodes were missing or displaced: none. The occipital dominant rhythm was 9 Hz. This activity is reactive to stimulation. Drowsiness was manifested by background fragmentation; deeper stages of sleep were identified by K complexes and sleep spindles. There was no focal slowing. There were no interictal epileptiform discharges. There were no electrographic seizures identified. Impression: This EEG was obtained while awake and asleep and is normal.   Clinical Correlation: Normal EEGs, however, do not rule out epilepsy. Elida Matthews, MD [facility] [phone] If 7pm- 7am, please page neurology on call as listed in AMION.   ECHOCARDIOGRAM COMPLETE Result Date:  04/04/2024    ECHOCARDIOGRAM REPORT   Patient Name:   Jerome Cardenas Date of Exam: 04/04/2024 Medical Rec #:  [id number]       Height:       68.0 in Accession #:    [id number]      Weight:       203.0 lb Date of Birth:  [birth year]       BSA:          2.057 m Patient Age:    73 years        BP:           138/101 mmHg Patient Gender: M               HR:           100 bpm. Exam Location:  Inpatient Procedure: 2D Echo, Color Doppler, Cardiac Doppler and 3D Echo (Both Spectral            and Color Flow Doppler were utilized during procedure). Indications:    Elevated Troponin  History:        Patient has prior history of  Echocardiogram examinations, most                 recent 07/29/2022. Risk Factors:Hypertension and Diabetes.                 Cancer, Chronic Kidney Disease.  Sonographer:    Logan Shove RDCS Referring Phys: [id number] [facility] IMPRESSIONS  1. Left ventricular ejection fraction, by estimation, is 60 to 65%. The left ventricle has normal function. The left ventricle has no regional wall motion abnormalities. Left ventricular diastolic parameters are consistent with Grade I diastolic dysfunction (impaired relaxation).  2. Right ventricular systolic function is normal. The right ventricular size is normal.  3. The mitral valve is normal in structure. No evidence of mitral valve regurgitation. No evidence of mitral stenosis.  4. The aortic valve is tricuspid. Aortic valve regurgitation is trivial. No aortic stenosis is present.  5. The inferior vena cava is normal in size with greater than 50% respiratory variability, suggesting right atrial pressure of 3 mmHg. FINDINGS  Left Ventricle: Left ventricular ejection fraction, by estimation, is 60 to 65%. The left ventricle has normal function. The left ventricle has no regional wall motion abnormalities. The left ventricular internal cavity size was normal in size. There is  no left ventricular hypertrophy. Left ventricular diastolic parameters are consistent with Grade I diastolic dysfunction (impaired relaxation). Right Ventricle: The right ventricular size is normal. Right ventricular systolic function is normal. Left Atrium: Left atrial size was normal in size. Right Atrium: Right atrial size was normal in size. Pericardium: There is no evidence of pericardial effusion. Mitral Valve: The mitral valve is normal in structure. No evidence of mitral valve regurgitation. No evidence of mitral valve stenosis. Tricuspid Valve: The tricuspid valve is normal in structure. Tricuspid valve regurgitation is trivial. No evidence of tricuspid stenosis. Aortic Valve: The aortic valve is  tricuspid. Aortic valve regurgitation is trivial. No aortic stenosis is present. Aortic valve mean gradient measures 4.5 mmHg. Aortic valve peak gradient measures 9.3 mmHg. Aortic valve area, by VTI measures 1.91 cm. Pulmonic Valve: The pulmonic valve was normal in structure. Pulmonic valve regurgitation is not visualized. No evidence of pulmonic stenosis. Aorta: The aortic root is normal in size and structure. Venous: The inferior vena cava is normal in size with greater than 50% respiratory variability, suggesting right atrial pressure of 3 mmHg. IAS/Shunts:  No atrial level shunt detected by color flow Doppler. Additional Comments: 3D was performed not requiring image post processing on an independent workstation and was normal.  LEFT VENTRICLE PLAX 2D LVIDd:         4.70 cm LVIDs:         3.30 cm LV PW:         1.00 cm LV IVS:        0.80 cm LVOT diam:     2.20 cm   3D Volume EF: LV SV:         54        3D EF:        56 % LV SV Index:   26        LV EDV:       118 ml LVOT Area:     3.80 cm  LV ESV:       52 ml                          LV SV:        66 ml RIGHT VENTRICLE             IVC RV Basal diam:  2.30 cm     IVC diam: 1.70 cm RV S prime:     18.42 cm/s TAPSE (M-mode): 2.0 cm LEFT ATRIUM             Index        RIGHT ATRIUM          Index LA diam:        3.40 cm 1.65 cm/m   RA Area:     9.44 cm LA Vol (A2C):   52.8 ml 25.67 ml/m  RA Volume:   16.40 ml 7.97 ml/m LA Vol (A4C):   66.7 ml 32.43 ml/m LA Biplane Vol: 63.8 ml 31.02 ml/m  AORTIC VALVE AV Area (Vmax):    1.86 cm AV Area (Vmean):   1.86 cm AV Area (VTI):     1.91 cm AV Vmax:           152.50 cm/s AV Vmean:          99.300 cm/s AV VTI:            0.280 m AV Peak Grad:      9.3 mmHg AV Mean Grad:      4.5 mmHg LVOT Vmax:         74.50 cm/s LVOT Vmean:        48.600 cm/s LVOT VTI:          0.141 m LVOT/AV VTI ratio: 0.50  AORTA Ao Root diam: 3.20 cm Ao Asc diam:  3.50 cm  SHUNTS Systemic VTI:  0.14 m Systemic Diam: 2.20 cm Redell Shallow MD  Electronically signed by Redell Shallow MD Signature Date/Time: 04/04/2024/1:14:12 PM    Final    CT ANGIO HEAD NECK W WO CM Result Date: 04/04/2024 CLINICAL DATA:  73 year old male with evidence of left MCA territory lacunar infarct on MRI yesterday. Neurologic deficit, seizure. 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 EXAM: CT ANGIOGRAPHY HEAD AND NECK WITH AND WITHOUT CONTRAST TECHNIQUE: Multidetector CT imaging of the head and neck was performed using the standard protocol during bolus administration of intravenous contrast. Multiplanar CT image reconstructions and MIPs were obtained to evaluate the vascular anatomy. Carotid stenosis measurements (when applicable) are obtained utilizing NASCET criteria, using the distal internal carotid diameter  as the denominator. RADIATION DOSE REDUCTION: This exam was performed according to the departmental dose-optimization program which includes automated exposure control, adjustment of the mA and/or kV according to patient size and/or use of iterative reconstruction technique. CONTRAST:  75mL OMNIPAQUE  IOHEXOL  350 MG/ML SOLN COMPARISON:  Brain MRI yesterday.  Head CT yesterday. CTA head and neck 07/29/2022. FINDINGS: CT HEAD Brain: Stable non contrast CT appearance of the brain. Chronic small vessel disease including left thalamic lacunar infarct, bilateral white matter hypodensity. Suspected left MCA territory lacune and small left posterior fossa meningioma occult by CT. No acute intracranial hemorrhage or mass effect. Calvarium and skull base: Stable and intact. Paranasal sinuses: Visualized paranasal sinuses and mastoids are stable and well aerated. Orbits: Small left forehead benign scalp lipoma.  No gaze deviation. CTA NECK Skeleton: Chronic cervical spine degeneration. No acute osseous abnormality identified. Upper chest: Dense left subclavian and innominate venous contrast streak artifact. Otherwise negative. Other neck: Nonvascular neck soft  tissue spaces appears stable from last year and within normal limits. Aortic arch: Calcified arch atherosclerosis. Three vessel arch configuration. Right carotid system: Patent with chronic brachiocephalic artery and right CCA tortuosity. Mild soft and calcified plaque at the proximal right ICA without stenosis. Left carotid system: Patent with tortuosity, mild soft plaque in the left CCA. Minimal plaque at the left carotid bifurcation. No stenosis. Vertebral arteries: Tortuous proximal right subclavian artery without stenosis. Tortuous right vertebral artery origin with perhaps mild soft plaque but no stenosis. Patent right vertebral artery to the skull base without stenosis. Proximal left subclavian artery and left vertebral artery origin appear normal. Possible mild V1 segment plaque. Mildly dominant appearance of the left vertebral artery. No stenosis to the skull base. CTA HEAD Posterior circulation: Patent distal vertebral arteries and vertebrobasilar junction. Chronically dominant appearance of the left V4 segment, although fairly normal caliber. Chronically atherosclerotic and non dominant appearance of the distal right vertebral artery which remains patent. Mild left V4 plaque also. No hemodynamically significant distal vertebral stenosis. Patent basilar artery with mild irregularity. Fetal left PCA origin. Distal basilar and right PCA origin remain patent. Bilateral PCAs are patent, and bilateral PCA irregularity is less pronounced compared to the previous CTA. Up to moderate irregularity now right PCA P1/P2 junction and bilateral P3 segments (series 14, image 21). Anterior circulation: Both ICA siphons are patent. Mildly tortuous left siphon with mild calcified plaque and no stenosis. Normal left posterior communicating artery origin. Mild tortuosity of the right siphon. Mild to moderate right siphon calcified plaque without stenosis. Patent carotid termini. Patent MCA and ACA origins. Mildly dominant  left A1. Diminutive or absent anterior communicating artery. Bilateral ACA branches are patent with increased mild to moderate distal A2 segment irregularity compared to last year (series 13, image 22). MCA M1 segments and MCA bifurcations are patent. Left MCA distal M1, bifurcation, and proximal M2 mild to moderate irregularity appears stable on series 14, image 21. Comparatively mild right MCA bifurcation irregularity. MCA branches appear stable with mild irregularity bilaterally. Venous sinuses: Early contrast timing, not evaluated. Anatomic variants: Mildly dominant left vertebral artery, left ACA A1, fetal type left PCA origin. Review of the MIP images confirms the above findings IMPRESSION: 1. Negative for large vessel occlusion. Chronic extracranial artery tortuosity but little extracranial atherosclerosis. Chronically more advanced Intracranial Atherosclerosis. Notable for: - increased ACA A2 segment irregularity and stenosis, mild-to-moderate. - stable left > right MCA segment irregularity and stenosis, mild-to-moderate. - improved appearance of bilateral PCA irregularity and stenosis, now moderate. 2.  Stable non contrast CT appearance of the brain. 3.  Aortic Atherosclerosis (ICD10-I70.0). Electronically Signed   By: VEAR Hurst M.D.   On: 04/04/2024 07:43   MR Brain W and Wo Contrast Result Date: 04/03/2024 EXAM: MRI BRAIN WITH AND WITHOUT CONTRAST 04/03/2024 06:26:19 PM TECHNIQUE: Multiplanar multisequence MRI of the head/brain was performed with and without the administration of 9.2 mL gadobutrol  (GADAVIST ) 1 MMOL/ML intravenous contrast. COMPARISON: CT head without contrast 04/03/2024. MR head without contrast 07/28/2022. CLINICAL HISTORY: Brain/CNS neoplasm, staging; Prostate CA, new onset sz. Best images due to pt motion. FINDINGS: BRAIN AND VENTRICLES: An acute 7 mm nonhemorrhagic infarct is present within the inferior left lentiform nucleus. The previously noted left thalamic infarct demonstrates  expected evolution. Confluent periventricular white matter changes are moderately advanced for age. This most likely reflects the sequelae of chronic microvascular ischemia. Mild white matter changes extend into the brainstem. A 4 mm dural-based lesion extends inferiorly from the left side of the tentorium, most consistent with a meningioma. No other pathologic enhancement is present. No acute intracranial hemorrhage. No mass effect or midline shift. No hydrocephalus. The sella is unremarkable. Normal flow voids. ORBITS: No acute abnormality. SINUSES: No acute abnormality. BONES AND SOFT TISSUES: Normal bone marrow signal and enhancement. No acute soft tissue abnormality. IMPRESSION: 1. Acute 7 mm nonhemorrhagic infarct within the inferior left lentiform nucleus. 2. 4 mm dural-based lesion extending inferiorly from the left side of the tentorium, most consistent with a meningioma. 3. Moderately advanced confluent periventricular white matter changes, likely sequelae of chronic microvascular ischemia. Critical value findings were called to Dr. Deretha at 6:47 pm Electronically signed by: Lonni Necessary MD 04/03/2024 06:50 PM EDT RP Workstation: [id number]   CT Angio Chest PE W and/or Wo Contrast Result Date: 04/03/2024 CLINICAL DATA:  Positive D-dimer. Seizures. Low to intermediate probability for pulmonary embolism. EXAM: CT ANGIOGRAPHY CHEST WITH CONTRAST TECHNIQUE: Multidetector CT imaging of the chest was performed using the standard protocol during bolus administration of intravenous contrast. Multiplanar CT image reconstructions and MIPs were obtained to evaluate the vascular anatomy. RADIATION DOSE REDUCTION: This exam was performed according to the departmental dose-optimization program which includes automated exposure control, adjustment of the mA and/or kV according to patient size and/or use of iterative reconstruction technique. CONTRAST:  75mL OMNIPAQUE  IOHEXOL  350 MG/ML SOLN COMPARISON:  Chest CT  02/25/2008 and abdominal CT 10/15/2023 FINDINGS: Cardiovascular: Borderline cardiomegaly. Mild calcified plaque over the left anterior descending coronary artery. Thoracic aorta measures 3.8 cm in AP diameter. Thoracic aorta is otherwise unremarkable. Adequate opacification of the pulmonary arterial system without evidence of pulmonary embolism. Remaining vascular structures are unremarkable. Mediastinum/Nodes: Mediastinum demonstrates no evidence of adenopathy. No hilar adenopathy. Remaining mediastinal structures are unremarkable. Lungs/Pleura: Lungs are adequately inflated. There is significant artifact from metallic device over the posterior right thorax just right of midline. Possible mild opacification over the superior segment right lower lobe which could be due to infection versus atelectasis, although may be due to artifact from the adjacent metallic device over the posterior thorax. Remainder of the right lung is clear. Left lung is clear. Airways are unremarkable. Upper Abdomen: No acute findings over the visualized upper abdominal images. Musculoskeletal: No focal abnormality. Review of the MIP images confirms the above findings. IMPRESSION: 1. No evidence of pulmonary embolism. 2. Possible mild opacification over the superior segment right lower lobe which could be due to infection versus atelectasis, although may be due to artifact from the adjacent metallic device over the posterior thorax. Consider  follow-up noncontrast chest CT without metallic device present for further evaluation versus PA and lateral chest radiograph. 3. Borderline cardiomegaly. Mild atherosclerotic coronary artery disease. 4. Aortic atherosclerosis. Aortic Atherosclerosis (ICD10-I70.0). Electronically Signed   By: Toribio Agreste M.D.   On: 04/03/2024 12:40   DG Chest Port 1 View Result Date: 04/03/2024 CLINICAL DATA:  Shortness of breath EXAM: PORTABLE CHEST 1 VIEW COMPARISON:  02/25/2008 portable chest and chest CTA.  FINDINGS: Poor inspiration. Normal-sized heart. Tortuous and partially calcified thoracic aorta. Clear lungs with normal vascularity. Mildly elevated left hemidiaphragm. Mild lower thoracic spine degenerative changes. IMPRESSION: No acute abnormality. Electronically Signed   By: Elspeth Bathe M.D.   On: 04/03/2024 10:02   CT Head Wo Contrast Result Date: 04/03/2024 EXAM: CT HEAD WITHOUT CONTRAST 04/03/2024 09:19:00 AM TECHNIQUE: CT of the head was performed without the administration of intravenous contrast. Automated exposure control, iterative reconstruction, and/or weight based adjustment of the mA/kV was utilized to reduce the radiation dose to as low as reasonably achievable. COMPARISON: CT of the head dated 02/05/2024. CLINICAL HISTORY: Mental status change, unknown cause. No contrast. BIB EMS hx of stroke with right side deficit. Also being treated for prostate cancer. Wife heard a moan and checked on him and he was hving seizure like activity that last 90sec. No seizure hx. Trauma to tongue no incontinence. Normally ; alert and oriented. Postictal at this time but slowly coming around. 200/130 Runs of bigemny. CBG 178 20g L wrist. FINDINGS: BRAIN AND VENTRICLES: No acute hemorrhage. No evidence of acute infarct. No hydrocephalus. No extra-axial collection. No mass effect or midline shift. There is age-related atrophy and mild-to-moderate periventricular white matter disease. ORBITS: No acute abnormality. SINUSES: No acute abnormality. SOFT TISSUES AND SKULL: No acute soft tissue abnormality. No skull fracture. IMPRESSION: 1. No acute intracranial abnormality. 2. Age-related cerebral atrophy and mild-to-moderate periventricular white matter disease. Electronically signed by: Evalene Coho MD 04/03/2024 09:32 AM EDT RP Workstation: [id number]     PHYSICAL EXAM  Temp:  [97.6 F (36.4 C)-99.1 F (37.3 C)] 97.7 F (36.5 C) (09/28 1125) Pulse Rate:  [73-92] 73 (09/28 1125) Resp:  [16-18] 18 (09/28  1125) BP: (141-183)/(90-104) 151/91 (09/28 1125) SpO2:  [93 %-99 %] 99 % (09/28 1125)  General - Well nourished, well developed, in no apparent distress.  Ophthalmologic - fundi not visualized due to noncooperation.  Cardiovascular - Regular rhythm with tachycardia.  Neuro - awake, alert, mild lethargy, eyes open, orientated to place, time and people, but not orientated to age, again told me age 88 instead of 53. No aphasia, paucity of speech, but following all simple commands, mild to moderate psychomotor slowing. Able to name and repeat. No gaze palsy, tracking bilaterally, visual field full. No facial droop. Tongue midline. Bilateral UEs 4/5, no drift. Bilaterally LEs 3+/5, no drift. Sensation symmetrical bilaterally subjectively, b/l FTN intact but slow, gait not tested.     ASSESSMENT/PLAN Jerome Cardenas is a 73 y.o. male with history of hypertension, hyperlipidemia, diabetes, prostate cancer, stroke, recent fall, questionable dementia admitted for seizure. No TNK given due to outside window.    Seizure, etiology for seizure episode not quite clear, could be due to current stroke, recent fall with concussion, cognitive decline. Wife also stated that he probably taking 90mg  cymbalta  instead of 60mg  recently by mistake. No history of seizure GTC seizure lasting 2 to 3 minutes at home per wife followed by postictal EEG normal On Keppra 500 twice daily now, continue on discharge Continue  home Topamax  UA negative CXR negative Blood culture NGTD No driving until seizure-free for 6 months and under physicians care. Wife is aware  Stroke:  left BG small infarct and left parietal occipital punctate periventricular infarct, likely secondary to small vessel disease source, cannot completely rule out cardioembolic source CT no acute abnormality CT head and neck mild to moderate bilateral A2, MCA and PCA stenosis MRI  Acute 7 mm nonhemorrhagic infarct within the inferior left lentiform  nucleus.  On my read, there is 1 punctate periventricular infarct at left parietal occipital region. 2D Echo EF 60 to 65% Recommend 30-day cardiac event monitor as outpatient to rule out A-fib LDL 108 HgbA1c 6.1 SCDs for VTE prophylaxis aspirin  81 mg daily prior to admission, now on aspirin  81 mg daily and clopidogrel  75 mg daily DAPT for 3 weeks and then Plavix  alone. Patient counseled to be compliant with his antithrombotic medications Ongoing aggressive stroke risk factor management Therapy recommendations: HH PT Disposition: Pending  History of stroke 07/2022 admitted for left thalamic infarct.  CT head and neck showed left P3 occlusion.  EF 60 to 65%, LDL 67, A1c 6.6, TG 371, discharged on DAPT for 3 months and then Crestor  20.  Patient with residual mild right-sided weakness per wife.  Diabetes HgbA1c 6.1 goal < 7.0 Controlled CBG monitoring SSI DM education and close PCP follow up  Hypertension Home meds including metoprolol  and spironolactone  Stable on high and With tachycardia now resolved On home metoprolol  Avoid low BP Long term BP goal normotensive  Hyperlipidemia Home meds: Crestor  20 and Tricor  LDL 108, goal < 70 Now on Crestor  40 and Tricor  Continue statin and Tricor  at discharge  Other Stroke Risk Factors Advanced age Obesity, Body mass index is 30.87 kg/m.   Other Active Problems Cognitive decline Recent fall on 02/05/2024, he had with LOC, consistent with concussion Prostate cancer on Xtandi   Lower back pain  Hospital day # 2  Neurology will sign off. Please call with questions. Pt will follow up with stroke clinic NP [place] at [facility] in about 4 weeks. Thanks for the consult.   Ary Cummins, MD PhD Stroke Neurology 04/05/2024 3:20 PM    To contact Stroke Continuity provider, please refer to [email or web address]. After hours, contact General Neurology

## 2024-04-06 ENCOUNTER — Inpatient Hospital Stay (HOSPITAL_COMMUNITY)

## 2024-04-06 DIAGNOSIS — R569 Unspecified convulsions: Secondary | ICD-10-CM | POA: Diagnosis not present

## 2024-04-06 LAB — GLUCOSE, CAPILLARY
Glucose-Capillary: 213 mg/dL — ABNORMAL HIGH (ref 70–99)
Glucose-Capillary: 159 mg/dL — ABNORMAL HIGH (ref 70–99)
Glucose-Capillary: 172 mg/dL — ABNORMAL HIGH (ref 70–99)
Glucose-Capillary: 161 mg/dL — ABNORMAL HIGH (ref 70–99)

## 2024-04-06 MED ORDER — IPRATROPIUM-ALBUTEROL 0.5-2.5 (3) MG/3ML IN SOLN: 3.0000 mL | RESPIRATORY_TRACT | Status: DC | PRN

## 2024-04-06 MED ORDER — POLYETHYLENE GLYCOL 3350 17 G PO PACK
17.0000 g | PACK | Freq: Two times a day (BID) | ORAL | Status: DC
Start: 2024-04-06 — End: 2024-04-09
  Administered 2024-04-06 – 2024-04-08 (×6): 17 g via ORAL
  Filled 2024-04-06 (×6): qty 1

## 2024-04-06 MED ORDER — STROKE: EARLY STAGES OF RECOVERY BOOK
Status: AC
  Filled 2024-04-06: qty 1

## 2024-04-06 MED ORDER — IOHEXOL 350 MG/ML SOLN
75.0000 mL | Freq: Once | INTRAVENOUS | Status: AC | PRN
  Administered 2024-04-06: 75 mL via INTRAVENOUS

## 2024-04-06 MED ORDER — STROKE: EARLY STAGES OF RECOVERY BOOK: Freq: Once | Status: AC

## 2024-04-06 MED ORDER — HYDRALAZINE HCL 20 MG/ML IJ SOLN
10.0000 mg | INTRAMUSCULAR | Status: DC | PRN
Start: 2024-04-06 — End: 2024-04-09

## 2024-04-06 MED ORDER — GADOBUTROL 1 MMOL/ML IV SOLN
9.0000 mL | Freq: Once | INTRAVENOUS | Status: AC | PRN
Start: 2024-04-06 — End: 2024-04-06
  Administered 2024-04-06: 9 mL via INTRAVENOUS

## 2024-04-06 MED ORDER — BISACODYL 5 MG PO TBEC
10.0000 mg | DELAYED_RELEASE_TABLET | Freq: Every day | ORAL | Status: DC
  Administered 2024-04-06 – 2024-04-08 (×3): 10 mg via ORAL
  Filled 2024-04-06 (×4): qty 2

## 2024-04-06 MED ORDER — PROCHLORPERAZINE EDISYLATE 10 MG/2ML IJ SOLN
5.0000 mg | Freq: Once | INTRAMUSCULAR | Status: AC
  Administered 2024-04-06: 5 mg via INTRAVENOUS
  Filled 2024-04-06: qty 2

## 2024-04-06 MED ORDER — SIMETHICONE 80 MG PO CHEW
80.0000 mg | CHEWABLE_TABLET | Freq: Four times a day (QID) | ORAL | Status: AC
Start: 2024-04-06 — End: 2024-04-07
  Administered 2024-04-06 – 2024-04-07 (×3): 80 mg via ORAL
  Filled 2024-04-06 (×3): qty 1

## 2024-04-06 NOTE — Progress Notes (Signed)
 Physical Therapy Treatment Patient Details Name: Jerome Cardenas MRN: [id number] DOB: 1950/12/08 Today's Date: 04/06/2024   History of Present Illness 73 yo male presents to [facility] on 9/26 with possible seizure activity at home. Of note, fall on 7/30 with head trauma and +LOC. MRI brain shows left BG small infarct and left parietal occipital punctate periventricular infarct. Being treated for aspiration PNA. PMH includes HTN, HLD, recurrent prostate cancer, DMII, L thalamic cva.    PT Comments  Pt spouse with concerns about taking pt home. Pt reporting significant RLQ and lower back pain with bed mobility and raising HOB with facial grimacing, describes as sharp, and achy. Tenderness to palpation along RLQ (RN notified). Pt requiring min assist for bed mobility/transfers and ambulating 200 ft with a Rollator and CGA. SpO2 92-96% on RA. Recommendation of HHPT remains appropriate.     If plan is discharge home, recommend the following: A little help with walking and/or transfers;A little help with bathing/dressing/bathroom   Can travel by private vehicle        Equipment Recommendations  Rolling walker (2 wheels)    Recommendations for Other Services       Precautions / Restrictions Precautions Precautions: Fall Restrictions Weight Bearing Restrictions Per Provider Order: No     Mobility  Bed Mobility Overal bed mobility: Needs Assistance Bed Mobility: Supine to Sit, Sit to Sidelying, Rolling Rolling: Contact guard assist   Supine to sit: Min assist, Used rails   Sit to sidelying: Min assist General bed mobility comments: Pt able to bring BLE's off edge of bed, trunk assist to upright. Assist to fully bring BLE's back into bed    Transfers Overall transfer level: Needs assistance Equipment used: Rollator (4 wheels) Transfers: Sit to/from Stand Sit to Stand: Min assist           General transfer comment: MinA to power up to stand    Ambulation/Gait Ambulation/Gait  assistance: Contact guard assist Gait Distance (Feet): 200 Feet Assistive device: Rollator (4 wheels) Gait Pattern/deviations: Step-through pattern, Decreased stride length, Shuffle, Trunk flexed Gait velocity: decr Gait velocity interpretation: <1.8 ft/sec, indicate of risk for recurrent falls   General Gait Details: Verbal cues for proximity to Rollator and taking bigger steps   Stairs             Wheelchair Mobility     Tilt Bed    Modified Rankin (Stroke Patients Only)       Balance Overall balance assessment: Needs assistance Sitting-balance support: Feet supported, No upper extremity supported Sitting balance-Leahy Scale: Fair     Standing balance support: Bilateral upper extremity supported, During functional activity, Reliant on assistive device for balance Standing balance-Leahy Scale: Poor                              Communication Communication Communication: Impaired Factors Affecting Communication: Other (comment) (soft spoken)  Cognition Arousal: Alert Behavior During Therapy: Flat affect   PT - Cognitive impairments: History of cognitive impairments, Problem solving, Safety/Judgement, Sequencing                       PT - Cognition Comments: sequencing assist needed especially during bed mobility, slowed processing. Per chart review, wife states pt has been having cognitive changes Following commands: Impaired Following commands impaired: Follows one step commands with increased time    Cueing Cueing Techniques: Verbal cues, Gestural cues  Exercises  General Comments        Pertinent Vitals/Pain Pain Assessment Pain Assessment: Faces Faces Pain Scale: Hurts whole lot Pain Location: lower back, RLQ Pain Descriptors / Indicators: Grimacing, Guarding, Sharp, Aching Pain Intervention(s): Monitored during session, Limited activity within patient's tolerance    Home Living                          Prior  Function            PT Goals (current goals can now be found in the care plan section) Acute Rehab PT Goals Patient Stated Goal: pt spouse wants to be able to manage him at home Potential to Achieve Goals: Good Progress towards PT goals: Progressing toward goals    Frequency    Min 2X/week      PT Plan      Co-evaluation              AM-PAC PT 6 Clicks Mobility   Outcome Measure  Help needed turning from your back to your side while in a flat bed without using bedrails?: A Little Help needed moving from lying on your back to sitting on the side of a flat bed without using bedrails?: A Little Help needed moving to and from a bed to a chair (including a wheelchair)?: A Little Help needed standing up from a chair using your arms (e.g., wheelchair or bedside chair)?: A Little Help needed to walk in hospital room?: A Little Help needed climbing 3-5 steps with a railing? : A Lot 6 Click Score: 17    End of Session Equipment Utilized During Treatment: Gait belt Activity Tolerance: Patient tolerated treatment well Patient left: in bed;with call bell/phone within reach;with bed alarm set   PT Visit Diagnosis: Other abnormalities of gait and mobility (R26.89);Muscle weakness (generalized) (M62.81)     Time: 9046-8974 PT Time Calculation (min) (ACUTE ONLY): 32 min  Charges:    $Therapeutic Activity: 23-37 mins PT General Charges $$ ACUTE PT VISIT: 1 Visit                     Aleck Daring, PT, DPT Acute Rehabilitation Services Office [phone]    Aleck ONEIDA Daring 04/06/2024, 10:32 AM

## 2024-04-06 NOTE — Progress Notes (Signed)
 PROGRESS NOTE    Manley Fason Swart  FMW:[id number] DOB: Jun 04, 1951 DOA: 04/03/2024 PCP: Evangelina Tinnie Norris, PA-C    Brief Narrative:    73 year old male with history of hypertension, hyperlipidemia, recurrent stroke, prostate cancer with spinal mets, type 2 diabetes , CVA who was brought to the to the emergency department for new onset seizure.  His wife found him on the floor and witnessed her seizure.  He was postictal afterwards and also had weakness.  On presentation, he was hemodynamically stable.  Lab work showed potassium of 3.1, troponin of 146, D-dimer 3.9.  CTA did not show any PE but also showed  possible mild opacification over the superior segment right lowerlobe which could be due to infection versus atelectasis, although may be due to artifact .  CT head did not show any acute findings.  MRI of the brain showed acute 7 mm nonhemorrhagic infarct within the inferior left lentiform nucleus, 4 mm dural-based lesion extending inferiorly from the left side of the tentorium, most consistent with a meningioma.  Neurology consulted , stroke workup completed.  PT recommend home health discharge.  Family unable to take him today to home, he also has some back pain.  Possible discharge tomorrow after PT follow up   Assessment & Plan:   Seizure, new onset Likely associated with acute stroke.  Seen by neurology, started Keppra. -EEG did not show seizure or epileptiform discharge.  Continue Keppra on discharge.  No driving until seizure-free for 6 months   Acute CVA  History of CVA in the past.  MRI showed cute 7 mm nonhemorrhagic infarct within the inferior left lentiform nucleus.  Initiated the stroke workup.  PT/OT/speech evaluation done.  A1c of 6.1, LDL of 108.  Currently on aspirin  and Plavix .  Plan is to continue DAPT for 3 weeks followed by Plavix  alone .  Echo showed EF of 60 to 65%, no intracardiac source of emboli.  Neurology recommended 30-day cardiac event monitoring as  outpatient to rule out A-fib.  Sent message to [facility] for this and cardiology will arrange it  Abdominal distention/ileus Constipation - Ambulate/mobilize patient as much as possible. Bowel regimen.    Hypertension, uncontrolled Takes amlodipine , spironolactone , olmesartan, metoprolol  at home.  This can slowly be normalized over the next week   History of prostate cancer with spinal mets/Back pain  Follows with urology, oncology(Dr. Madison at [facility]).  On Xtandi  and leuprolide every 3 months. MRI cervical, thoracic and lumbar spine ordered due to persistent and worsening back pain   Suspected pneumonia  Chest x-ray was clear.  But CT also showed  possible mild opacification over the superior segment right lowerlobe, likely artifact.  Antibiotics discontinued   GERD  Continue PPI   Hypokalemia  Supplemented and corrected   History of hyperlipidemia  Takes fenofibrate , Crestor    History of BPH  Takes finasteride, Flomax    Elevated troponin: Flat trend.  No chest pain.   Likely demand ischemia. Echo did not show any regional wall motion abnormalities   Cognitive impairment  Chronic problem. Continue delirium precaution, frequent orientation   DVT prophylaxis: enoxaparin  (LOVENOX ) injection 40 mg Start: 04/04/24 1000      Code Status: Full Code Family Communication: Spouse at bedside Status is: Inpatient Remains inpatient appropriate because: Hopefully discharge today   PT Follow up Recs: Home Health Pt9/28/2025 1330, face-to-face completed  Subjective: Patient reporting of right lower quadrant abdominal pain.  Has not had a bowel movement in few days.  Abdomen is  slightly distended   Examination:  General exam: Appears calm and comfortable  Respiratory system: Clear to auscultation. Respiratory effort normal. Cardiovascular system: S1 & S2 heard, RRR. No JVD, murmurs, rubs, gallops or clicks. No pedal edema. Gastrointestinal system: Abdomen is distended  with diminished bowel sounds Central nervous system: Alert and oriented. No focal neurological deficits. Extremities: Symmetric 5 x 5 power. Skin: No rashes, lesions or ulcers Psychiatry: Judgement and insight appear normal. Mood & affect appropriate.                Diet Orders (From admission, onward)     Start     Ordered   04/04/24 1859  Diet Carb Modified Fluid consistency: Thin; Room service appropriate? Yes  Diet effective now       Question Answer Comment  Diet-HS Snack? Nothing   Calorie Level Medium 1600-2000   Fluid consistency: Thin   Room service appropriate? Yes      04/04/24 1859            Objective: Vitals:   04/05/24 2004 04/06/24 0412 04/06/24 0834 04/06/24 1136  BP: (!) 154/87 (!) 153/86 (!) 162/82 (!) 160/85  Pulse: 79 74 67 70  Resp: 19 19 19 19   Temp: 98.7 F (37.1 C) 98.4 F (36.9 C) 98.1 F (36.7 C) 98.6 F (37 C)  TempSrc: Oral  Oral   SpO2: 97% 95% 96% 95%  Weight:      Height:       No intake or output data in the 24 hours ending 04/06/24 1224 Filed Weights   04/03/24 0851  Weight: 92.1 kg    Scheduled Meds:  amLODipine   10 mg Oral Daily   aspirin  EC  81 mg Oral Daily   bisacodyl  10 mg Oral Q1500   clopidogrel   75 mg Oral Daily   DULoxetine   60 mg Oral Daily   enoxaparin  (LOVENOX ) injection  40 mg Subcutaneous Q24H   fenofibrate   54 mg Oral Daily   insulin aspart  0-9 Units Subcutaneous TID WC   irbesartan   300 mg Oral Daily   levETIRAcetam  500 mg Oral BID   metoprolol  succinate  75 mg Oral Daily   pantoprazole   40 mg Oral Daily   polyethylene glycol  17 g Oral BID   rosuvastatin   40 mg Oral Daily   simethicone  80 mg Oral QID   spironolactone   25 mg Oral Daily   topiramate   25 mg Oral BID   Continuous Infusions:  Nutritional status     Body mass index is 30.87 kg/m.  Data Reviewed:   CBC: Recent Labs  Lab 04/03/24 0901 04/05/24 0506  WBC 11.2* 9.1  HGB 15.8 14.6  HCT 43.7 40.1  MCV 85.7 86.1   PLT 164 148*   Basic Metabolic Panel: Recent Labs  Lab 04/03/24 0901 04/03/24 1518 04/04/24 1020 04/05/24 0506  NA 137  --  138 136  K 3.1*  --  3.1* 4.0  CL 101  --  111 103  CO2 20*  --  19* 21*  GLUCOSE 193*  --  174* 193*  BUN 14  --  8 9  CREATININE 1.18  --  0.74 0.91  CALCIUM  9.2  --  7.3* 8.8*  MG  --  1.7  --   --    GFR: Estimated Creatinine Clearance: 78.5 mL/min (by C-G formula based on SCr of 0.91 mg/dL). Liver Function Tests: No results for input(s): AST, ALT, ALKPHOS, BILITOT, PROT, ALBUMIN in  the last 168 hours. No results for input(s): LIPASE, AMYLASE in the last 168 hours. No results for input(s): AMMONIA in the last 168 hours. Coagulation Profile: No results for input(s): INR, PROTIME in the last 168 hours. Cardiac Enzymes: No results for input(s): CKTOTAL, CKMB, CKMBINDEX, TROPONINI in the last 168 hours. BNP (last 3 results) No results for input(s): PROBNP in the last 8760 hours. HbA1C: No results for input(s): HGBA1C in the last 72 hours. CBG: Recent Labs  Lab 04/05/24 1123 04/05/24 1618 04/05/24 2109 04/06/24 0609 04/06/24 1137  GLUCAP 226* 145* 172* 213* 159*   Lipid Profile: Recent Labs    04/03/24 1704  CHOL 159  HDL 31*  LDLCALC 108*  TRIG 100  CHOLHDL 5.1   Thyroid  Function Tests: No results for input(s): TSH, T4TOTAL, FREET4, T3FREE, THYROIDAB in the last 72 hours. Anemia Panel: No results for input(s): VITAMINB12, FOLATE, FERRITIN, TIBC, IRON, RETICCTPCT in the last 72 hours. Sepsis Labs: No results for input(s): PROCALCITON, LATICACIDVEN in the last 168 hours.  Recent Results (from the past 240 hours)  Blood culture (routine x 2)     Status: None (Preliminary result)   Collection Time: 04/03/24  3:18 PM   Specimen: BLOOD  Result Value Ref Range Status   Specimen Description BLOOD SITE NOT SPECIFIED  Final   Special Requests   Final    BOTTLES DRAWN AEROBIC  AND ANAEROBIC Blood Culture adequate volume   Culture   Final    NO GROWTH 3 DAYS Performed at [facility], [address], [place], [name] [address]    Report Status PENDING  Incomplete  Blood culture (routine x 2)     Status: None (Preliminary result)   Collection Time: 04/03/24  3:25 PM   Specimen: BLOOD  Result Value Ref Range Status   Specimen Description BLOOD SITE NOT SPECIFIED  Final   Special Requests   Final    BOTTLES DRAWN AEROBIC AND ANAEROBIC Blood Culture adequate volume   Culture   Final    NO GROWTH 3 DAYS Performed at [facility], [address], [place], [name] [address]    Report Status PENDING  Incomplete         Radiology Studies: DG Abd 1 View Result Date: 04/06/2024 CLINICAL DATA:  Abdominal distension. EXAM: ABDOMEN - 1 VIEW COMPARISON:  CT 10/15/2023 FINDINGS: Surgical clips over the right upper quadrant. There multiple air-filled loops of large and small bowel. Mild ill for prominence of the cecum measuring 11.9 cm in diameter. Borderline dilatation of a single small bowel loop just above the left of midline in the mid abdomen measuring 3.0 cm. No free peritoneal air. No air-fluid levels. Findings suggesting mild compression fracture of L2 age indeterminate. Degenerative change of the spine and hips. Radiation seed implants over the prostate gland. IMPRESSION: 1. Nonspecific, nonobstructive bowel gas pattern with mild prominence of the cecum measuring 11.9 cm in diameter as well as borderline dilatation of a single small bowel loop in the mid abdomen. Findings may be due to ileus. Recommend follow-up serial abdominal films as clinically indicated. 2. Mild compression fracture of L2 age indeterminate. Electronically Signed   By: Toribio Agreste M.D.   On: 04/06/2024 12:02   EEG adult Result Date: 04/04/2024 Matthews Elida HERO, MD     04/04/2024  3:49 PM Routine EEG Report RANDEE UPCHURCH is a 73 y.o. male with a history of seizure who is undergoing an  EEG to evaluate for seizures. Report:  This EEG was acquired with electrodes placed according to the International 10-20 electrode system (including Fp1, Fp2, F3, F4, C3, C4, P3, P4, O1, O2, T3, T4, T5, T6, A1, A2, Fz, Cz, Pz). The following electrodes were missing or displaced: none. The occipital dominant rhythm was 9 Hz. This activity is reactive to stimulation. Drowsiness was manifested by background fragmentation; deeper stages of sleep were identified by K complexes and sleep spindles. There was no focal slowing. There were no interictal epileptiform discharges. There were no electrographic seizures identified. Impression: This EEG was obtained while awake and asleep and is normal.   Clinical Correlation: Normal EEGs, however, do not rule out epilepsy. Elida Ross, MD [facility] [phone] If 7pm- 7am, please page neurology on call as listed in AMION.           LOS: 3 days   Time spent= 35 mins    Burgess JAYSON Dare, MD [facility]  If 7PM-7AM, please contact night-coverage  04/06/2024, 12:24 PM

## 2024-04-06 NOTE — Hospital Course (Addendum)
 Brief Narrative:    73 year old male with history of hypertension, hyperlipidemia, recurrent stroke, prostate cancer with spinal mets, type 2 diabetes , CVA who was brought to the to the emergency department for new onset seizure.  His wife found him on the floor and witnessed her seizure.  He was postictal afterwards and also had weakness.  On presentation, he was hemodynamically stable.  Lab work showed potassium of 3.1, troponin of 146, D-dimer 3.9.  CTA did not show any PE but also showed  possible mild opacification over the superior segment right lowerlobe which could be due to infection versus atelectasis, although may be due to artifact .  CT head did not show any acute findings.  MRI of the brain showed acute 7 mm nonhemorrhagic infarct within the inferior left lentiform nucleus, 4 mm dural-based lesion extending inferiorly from the left side of the tentorium, most consistent with a meningioma.  Neurology consulted , stroke workup completed.  PT recommend home health discharge.  Unfortunately due to persistent abdominal pain and back pain additional workup revealed colonic ileus and multiple areas of compression fracture requiring conservative management as mentioned below.  Assessment & Plan:   Seizure, new onset Likely associated with acute stroke.  Seen by neurology, started Keppra. -EEG did not show seizure or epileptiform discharge.  Continue Keppra on discharge.  No driving until seizure-free for 6 months   Acute CVA  History of CVA in the past.  MRI showed cute 7 mm nonhemorrhagic infarct within the inferior left lentiform nucleus.   A1c of 6.1, LDL of 108.  Currently on aspirin  and Plavix .  Plan is to continue DAPT for 3 weeks followed by Plavix  alone .  Echo showed EF of 60 to 65%, no intracardiac source of emboli.  30 day event monitor arranged by Cards.   Abdominal distention/ileus Constipation - Seen on x-ray and CT scan.  No obvious obstruction.  Ambulate/mobilize patient as  much as possible. Bowel regimen.   Back pain - Acute compression fracture of T12, L1, L2 and L4.  Some disc protrusion around L2 affecting nerve roots.  Discussed with neurosurgery PA Camie.  Agree with pain management, TLSO brace, Decadron and outpatient follow-up for kyphoplasty evaluation   Hypertension, uncontrolled Takes amlodipine , spironolactone , olmesartan, metoprolol  at home.  This can slowly be normalized over the next week   History of prostate cancer with spinal mets/Back pain  Follows with urology, oncology(Dr. Madison at [facility]).  On Xtandi  and leuprolide every 3 months. No obvious metastatic dz noted on Spinal MRI.    Suspected pneumonia  Chest x-ray was clear.  But CT also showed  possible mild opacification over the superior segment right lowerlobe, likely artifact.  Antibiotics discontinued   GERD  Continue PPI   Hypokalemia  Supplemented and corrected   History of hyperlipidemia  Takes fenofibrate , Crestor    History of BPH  Takes finasteride, Flomax    Elevated troponin: Flat trend.  No chest pain.   Likely demand ischemia. Echo did not show any regional wall motion abnormalities   Cognitive impairment  Chronic problem. Continue delirium precaution, frequent orientation   DVT prophylaxis: Lovenox     Code Status: Full Code Family Communication: Spouse updated Status is: Inpatient Remains inpatient appropriate because: Hopefully discharge today   PT Follow up Recs: Home Health Pt9/28/2025 1330, face-to-face completed  Subjective: Seen at bedside, still reporting of back pain  Examination:  General exam: Appears calm and comfortable  Respiratory system: Clear to auscultation. Respiratory effort normal. Cardiovascular  system: S1 & S2 heard, RRR. No JVD, murmurs, rubs, gallops or clicks. No pedal edema. Gastrointestinal system: Abdomen is distended with diminished bowel sounds Central nervous system: Alert and oriented. No focal neurological  deficits. Extremities: Symmetric 5 x 5 power. Skin: No rashes, lesions or ulcers Psychiatry: Judgement and insight appear normal. Mood & affect appropriate.

## 2024-04-06 NOTE — Care Management Important Message (Signed)
 Important Message  Patient Details  Name: Jerome Cardenas MRN: [id number] Date of Birth: June 08, 1951   Important Message Given:  Yes - Medicare IM     Claretta Deed 04/06/2024, 3:19 PM

## 2024-04-06 NOTE — TOC Initial Note (Signed)
 Transition of Care [facility]) - Initial/Assessment Note    Patient Details  Name: Jerome Cardenas MRN: [id number] Date of Birth: 11-07-1950  Transition of Care [facility]) CM/SW Contact:    Andrez JULIANNA George, RN Phone Number: 04/06/2024, 1:24 PM  Clinical Narrative:                 Jerome Cardenas is a 73 y.o. year old male with past medical history of hypertension, hyperlipidemia, recurrent prostate cancer, and T2DM presenting to the ED after a first-time seizure this a.m. on 04/03/2024.   Pt lives at home with his spouse. She is able to provide supervision at home.  Pt was driving prior to this admission but spouse can provide needed transportation. Pt was managing his own medications at home but spouse plans to manage them after d/c.   Home health arranged with [facility]. Information on the AVS. Hedda will contact them for the first home visit. Wife has already ordered a walker for home.   Plan: scans of his back.  Wife will transport home when medically ready.  IP Care management following.  Expected Discharge Plan: Home w Home Health Services Barriers to Discharge: Continued Medical Work up   Patient Goals and CMS Choice   CMS Medicare.gov Compare Post Acute Care list provided to:: Patient Choice offered to / list presented to : Patient, Spouse      Expected Discharge Plan and Services   Discharge Planning Services: CM Consult Post Acute Care Choice: Home Health Living arrangements for the past 2 months: Single Family Home                           HH Arranged: PT, OT HH Agency: [facility] Date [name] Agency Contacted: 04/06/24   Representative spoke with at [facility] Agency: Darleene  Prior Living Arrangements/Services Living arrangements for the past 2 months: Single Family Home Lives with:: Spouse Patient language and need for interpreter reviewed:: Yes Do you feel safe going back to the place where you live?: Yes        Care giver support system in place?: Yes  (comment) Current home services: DME (rollator/ walker/ shower seat/ stair lift/ bars) Criminal Activity/Legal Involvement Pertinent to Current Situation/Hospitalization: No - Comment as needed  Activities of Daily Living      Permission Sought/Granted                  Emotional Assessment Appearance:: Appears stated age     Orientation: : Oriented to Self, Oriented to Place   Psych Involvement: No (comment)  Admission diagnosis:  Seizures (HCC) [R56.9] Seizure-like activity (HCC) [R56.9] Acute CVA (cerebrovascular accident) (HCC) [I63.9] Lung infiltrate on CT [R91.8] Patient Active Problem List   Diagnosis Date Noted   Seizures (HCC) 04/03/2024   Acute CVA (cerebrovascular accident) (HCC) 04/03/2024   Diabetes mellitus type 2 with neurological manifestations (HCC) 07/29/2022   Hypertension associated with diabetes (HCC) 07/29/2022   Hyperlipidemia associated with type 2 diabetes mellitus (HCC) 07/29/2022   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 [name] [address] Phone: [phone] Fax: [phone]     Social Drivers of Health (SDOH) Social History: SDOH  Screenings   Food Insecurity: Low Risk  (08/16/2023)   Received from [facility]  Housing: Low Risk  (10/11/2023)   Received from [facility]  Transportation Needs: No Transportation Needs (08/16/2023)   Received from [facility]  Utilities: Low Risk  (08/16/2023)   Received from [facility]  Financial Resource Strain: Low Risk  (05/02/2022)   [facility] from [facility] visits prior to 09/08/2022., [facility]  Physical Activity: Unknown (05/02/2022)   [facility] from [facility]  visits prior to 09/08/2022., [facility]  Social Connections: Unknown (05/02/2022)   Received from [facility], [facility] visits prior to 09/08/2022.  Stress: Stress Concern Present (05/02/2022)   Received from [facility], [facility] visits prior to 09/08/2022.  Tobacco Use: Low Risk  (04/03/2024)   SDOH Interventions:     Readmission Risk Interventions     No data to display

## 2024-04-07 DIAGNOSIS — R569 Unspecified convulsions: Secondary | ICD-10-CM | POA: Diagnosis not present

## 2024-04-07 LAB — BASIC METABOLIC PANEL WITH GFR
Anion gap: 12 (ref 5–15)
BUN: 19 mg/dL (ref 8–23)
CO2: 20 mmol/L — ABNORMAL LOW (ref 22–32)
Calcium: 8.9 mg/dL (ref 8.9–10.3)
Chloride: 100 mmol/L (ref 98–111)
Creatinine, Ser: 0.99 mg/dL (ref 0.61–1.24)
GFR, Estimated: >60 mL/min (ref >60)
Glucose, Bld: 184 mg/dL — ABNORMAL HIGH (ref 70–99)
Potassium: 4 mmol/L (ref 3.5–5.1)
Sodium: 132 mmol/L — ABNORMAL LOW (ref 135–145)

## 2024-04-07 LAB — PHOSPHORUS: Phosphorus: 4.4 mg/dL (ref 2.5–4.6)

## 2024-04-07 LAB — CBC
HCT: 43.5 % (ref 39.0–52.0)
Hemoglobin: 15.7 g/dL (ref 13.0–17.0)
MCH: 31.1 pg (ref 26.0–34.0)
MCHC: 36.1 g/dL — ABNORMAL HIGH (ref 30.0–36.0)
MCV: 86.1 fL (ref 80.0–100.0)
Platelets: 188 K/uL (ref 150–400)
RBC: 5.05 MIL/uL (ref 4.22–5.81)
RDW: 12.1 % (ref 11.5–15.5)
WBC: 10.3 K/uL (ref 4.0–10.5)
nRBC: 0 % (ref 0.0–0.2)

## 2024-04-07 LAB — GLUCOSE, CAPILLARY
Glucose-Capillary: 189 mg/dL — ABNORMAL HIGH (ref 70–99)
Glucose-Capillary: 165 mg/dL — ABNORMAL HIGH (ref 70–99)
Glucose-Capillary: 208 mg/dL — ABNORMAL HIGH (ref 70–99)
Glucose-Capillary: 221 mg/dL — ABNORMAL HIGH (ref 70–99)
Glucose-Capillary: 201 mg/dL — ABNORMAL HIGH (ref 70–99)

## 2024-04-07 LAB — MAGNESIUM: Magnesium: 1.9 mg/dL (ref 1.7–2.4)

## 2024-04-07 MED ORDER — ACETAMINOPHEN 500 MG PO TABS
1000.0000 mg | ORAL_TABLET | Freq: Three times a day (TID) | ORAL | Status: DC
  Administered 2024-04-07 – 2024-04-08 (×6): 1000 mg via ORAL
  Filled 2024-04-07 (×6): qty 2

## 2024-04-07 MED ORDER — DEXAMETHASONE SODIUM PHOSPHATE 4 MG/ML IJ SOLN
4.0000 mg | Freq: Two times a day (BID) | INTRAMUSCULAR | Status: DC
Start: 2024-04-07 — End: 2024-04-08
  Administered 2024-04-07 – 2024-04-08 (×3): 4 mg via INTRAVENOUS
  Filled 2024-04-07 (×4): qty 1

## 2024-04-07 NOTE — Progress Notes (Signed)
 Orthopedic Tech Progress Note Patient Details:  Jerome Cardenas [facility] [birth year] [id number]  Ortho Devices Type of Ortho Device: Thoracolumbar corset (TLSO) Ortho Device/Splint Location: BACK Ortho Device/Splint Interventions: Ordered, Application, Adjustment, Removal   Post Interventions Patient Tolerated: Well Instructions Provided: Care of device  Delanna LITTIE Pac 04/07/2024, 11:57 AM

## 2024-04-07 NOTE — Evaluation (Signed)
 Occupational Therapy Evaluation Patient Details Name: Jerome Cardenas MRN: [id number] DOB: June 09, 1951 Today's Date: 04/07/2024   History of Present Illness   73 yo male presents to [facility] on 9/26 with possible seizure activity at home. Of note, fall on 7/30 with head trauma and +LOC. MRI brain shows left BG small infarct and left parietal occipital punctate periventricular infarct. Being treated for aspiration PNA. MRI showing acute compression fractures involving the superior endplates of  L1, L2, and L4. Associated mild central height loss of up to 20%  without significant retropulsion. Also with acute compression fx T12. PMH includes HTN, HLD, recurrent prostate cancer, DMII, L thalamic CVA.     Clinical Impressions Pt admitted for the above details. Pt received seated in chair, supportive family at bedside. PTA, pt was indep with ADLs and mobility via SPC. Pt presenting today with flat affect, RUE>LUE weakness (worse than his baseline given residual RUE weakness from prior CVA), and instability. Hypophonic and difficult to understand at times. Functionally, he required total A for LB ADLs, max A for seated UB ADLs and min A for sit<>stand transfers. Up to mod A for safe approach to bed via RW as pt became diaphoretic and with posterior LOB upon sitting, BP checked and stable. RN updated.   Pt is currently functioning below baseline and would benefit from ongoing acute OT services to progress towards safe discharge and to facilitate return to prior level of function. Current recommendation is post-acute rehab (< 3 hours/day).     If plan is discharge home, recommend the following:   A lot of help with walking and/or transfers;A lot of help with bathing/dressing/bathroom;Direct supervision/assist for medications management;Direct supervision/assist for financial management;Assist for transportation;Help with stairs or ramp for entrance;Supervision due to cognitive status     Functional Status  Assessment   Patient has had a recent decline in their functional status and demonstrates the ability to make significant improvements in function in a reasonable and predictable amount of time.     Equipment Recommendations   Other (comment) (defer to next level of care)     Recommendations for Other Services         Precautions/Restrictions   Precautions Precautions: Fall;Back Precaution Booklet Issued: No Recall of Precautions/Restrictions: Impaired Required Braces or Orthoses: Spinal Brace Spinal Brace: Thoracolumbosacral orthotic;Applied in sitting position Restrictions Weight Bearing Restrictions Per Provider Order: No     Mobility Bed Mobility Overal bed mobility: Needs Assistance Bed Mobility: Sit to Supine, Rolling Rolling: Used rails     Sit to supine: Max assist, Used rails   General bed mobility comments: Cues for reverse log roll technique, pt diaphoretic and pale when lying sit>supine (BP stable); cues for rolling bilaterally to remove second gown from back in supine    Transfers Overall transfer level: Needs assistance Equipment used: Rolling walker (2 wheels) Transfers: Sit to/from Stand Sit to Stand: Min assist           General transfer comment: Stood from chair with cueing for hand placement & powering up. Needs up to mod A to safely take steps to bed as he fatigued and became diaphoretic; cued for safe use of RW.      Balance Overall balance assessment: Needs assistance Sitting-balance support: Feet supported, No upper extremity supported Sitting balance-Leahy Scale: Poor Sitting balance - Comments: upon sitting down on EOB pt with posterior LOB, needs mod-max A to prevent posterior fall and correct LOB   Standing balance support: Bilateral upper extremity supported, During  functional activity, Reliant on assistive device for balance Standing balance-Leahy Scale: Poor Standing balance comment: reliant on RW in stance, cues for forward  gaze as pt initially retropulsive upon standing but corrects once prompted                           ADL either performed or assessed with clinical judgement   ADL Overall ADL's : Needs assistance/impaired                 Upper Body Dressing : Maximal assistance;Sitting Upper Body Dressing Details (indicate cue type and reason): doffing TLSO brace, cues for assisting with velcro straps & sequencing Lower Body Dressing: Total assistance;Sitting/lateral leans Lower Body Dressing Details (indicate cue type and reason): adjusting B socks             Functional mobility during ADLs: Minimal assistance;Moderate assistance;Rolling walker (2 wheels);Cueing for safety       Vision Baseline Vision/History: 1 Wears glasses Ability to See in Adequate Light: 0 Adequate Patient Visual Report: No change from baseline Vision Assessment?: No apparent visual deficits Additional Comments: able to read time on clock accurately     Perception         Praxis         Pertinent Vitals/Pain Pain Assessment Pain Assessment: No/denies pain Faces Pain Scale: No hurt Pain Intervention(s): Limited activity within patient's tolerance, Monitored during session, Repositioned     Extremity/Trunk Assessment Upper Extremity Assessment Upper Extremity Assessment: Right hand dominant;RUE deficits/detail RUE Deficits / Details: baseline RUE>LUE weakness 2/2 prior CVA; pt endorsing reduced strength compared to his baseline - generally ~3+/5 RUE Sensation: WNL RUE Coordination: decreased fine motor;decreased gross motor (reduced accuracy with FTN and RAM testing)   Lower Extremity Assessment Lower Extremity Assessment: Defer to PT evaluation       Communication Communication Communication: Impaired Factors Affecting Communication: Reduced clarity of speech;Other (comment) (hypophonic, almost whispering majority of the time; minimally verbally conversational)   Cognition Arousal:  Alert Behavior During Therapy: Flat affect Cognition: Cognition impaired     Awareness: Intellectual awareness impaired Memory impairment (select all impairments): Short-term memory, Working memory Attention impairment (select first level of impairment): Sustained attention Executive functioning impairment (select all impairments): Initiation, Organization, Sequencing, Reasoning, Problem solving OT - Cognition Comments: very flat affect, delayed processing, [employer] time for responding to conversation/commands; reduced insight and compliance                 Following commands: Impaired Following commands impaired: Follows one step commands with increased time     Cueing  General Comments   Cueing Techniques: Verbal cues;Gestural cues;Visual cues;Tactile cues  supportive spouse and brother at bedside   Exercises     Shoulder Instructions      Home Living Family/patient expects to be discharged to:: Private residence Living Arrangements: Spouse/significant other Available Help at Discharge: Family Type of Home: House Home Access: Stairs to enter Secretary/administrator of Steps: 1   Home Layout: Two level Alternate Level Stairs-Number of Steps: flight - but installed Magazine features editor: Standard     Home Equipment: Cane - single point;Rollator (4 wheels)          Prior Functioning/Environment Prior Level of Function : Independent/Modified Independent;Driving             Mobility Comments: SPC for community mobiltiy PTA, has not been using AD for household distances ADLs Comments:  wife endorsing pt with gradual decline over the last ~2 months related to his function, voice, and mobility    OT Problem List: Decreased strength;Decreased activity tolerance;Impaired balance (sitting and/or standing);Decreased coordination;Pain   OT Treatment/Interventions: Self-care/ADL training;Therapeutic exercise;Energy conservation;DME and/or AE  instruction;Therapeutic activities;Cognitive remediation/compensation;Patient/family education;Balance training      OT Goals(Current goals can be found in the care plan section)   Acute Rehab OT Goals Patient Stated Goal: pt did not state OT Goal Formulation: With patient/family Time For Goal Achievement: 04/21/24 Potential to Achieve Goals: Good ADL Goals Pt Will Perform Grooming: with contact guard assist;standing Pt Will Perform Upper Body Dressing: with min assist;sitting;with caregiver independent in assisting Pt Will Perform Lower Body Dressing: with min assist;with adaptive equipment;sitting/lateral leans Pt Will Transfer to Toilet: with min assist;ambulating;bedside commode Pt Will Perform Toileting - Clothing Manipulation and hygiene: with min assist;sitting/lateral leans;sit to/from stand Additional ADL Goal #1: Pt will follow 2-step commands 50% of the time during ADLs and functional mobility.   OT Frequency:  Min 2X/week    Co-evaluation              AM-PAC OT 6 Clicks Daily Activity     Outcome Measure Help from another person eating meals?: A Little Help from another person taking care of personal grooming?: A Little Help from another person toileting, which includes using toliet, bedpan, or urinal?: Total Help from another person bathing (including washing, rinsing, drying)?: A Lot Help from another person to put on and taking off regular upper body clothing?: A Lot Help from another person to put on and taking off regular lower body clothing?: Total 6 Click Score: 12   End of Session Equipment Utilized During Treatment: Gait belt;Rolling walker (2 wheels);Back brace Nurse Communication: Mobility status  Activity Tolerance: Patient limited by fatigue;Patient limited by lethargy Patient left: in bed;with call bell/phone within reach;with bed alarm set;with nursing/sitter in room;with family/visitor present  OT Visit Diagnosis: Unsteadiness on feet  (R26.81);Muscle weakness (generalized) (M62.81);History of falling (Z91.81);Dizziness and giddiness (R42)                Time: 8560-8493 OT Time Calculation (min): 27 min Charges:  OT General Charges $OT Visit: 1 Visit OT Evaluation $OT Eval Moderate Complexity: 1 Mod OT Treatments $Therapeutic Activity: 8-22 mins  Terrah Decoster D., MSOT, OTR/L Acute Rehabilitation Services [phone] Secure Chat Preferred  Rikki Milch 04/07/2024, 4:10 PM

## 2024-04-07 NOTE — NC FL2 (Signed)
 [place]  MEDICAID FL2 LEVEL OF CARE FORM     IDENTIFICATION  Patient Name: Jerome Cardenas Birthdate: 04-27-1951 Sex: male Admission Date (Current Location): 04/03/2024  [facility] and [place] Number:  Producer, television/film/video and Address:  The [place], [address] [place], [name] [address]      Provider Number: [id number]  Attending Physician Name and Address:  Caleen Burgess BROCKS, MD  Relative Name and Phone Number:       Current Level of Care: Hospital Recommended Level of Care: Skilled Nursing Facility Prior Approval Number:    Date Approved/Denied:   PASRR Number: [id number]  Discharge Plan: SNF    Current Diagnoses: Patient Active Problem List   Diagnosis Date Noted   Seizures (HCC) 04/03/2024   Acute CVA (cerebrovascular accident) (HCC) 04/03/2024   Diabetes mellitus type 2 with neurological manifestations (HCC) 07/29/2022   Hypertension associated with diabetes (HCC) 07/29/2022   Hyperlipidemia associated with type 2 diabetes mellitus (HCC) 07/29/2022   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    Orientation RESPIRATION BLADDER Height & Weight     Self, Time, Place  Normal Incontinent Weight: 203 lb (92.1 kg) Height:  5' 8 (172.7 cm)  BEHAVIORAL SYMPTOMS/MOOD NEUROLOGICAL BOWEL NUTRITION STATUS      Continent Diet (carb modified)  AMBULATORY STATUS COMMUNICATION OF NEEDS Skin   Limited Assist Verbally Normal                       Personal Care Assistance Level of Assistance  Bathing, Feeding, Dressing Bathing Assistance: Limited assistance Feeding assistance: Limited assistance Dressing Assistance: Limited assistance     Functional Limitations Info  Speech     Speech Info: Impaired (delayed responses)    SPECIAL CARE FACTORS FREQUENCY  PT (By licensed PT), OT (By licensed OT)     PT Frequency: 5x/wk OT Frequency: 5x/wk            Contractures Contractures Info: Not present    Additional Factors Info  Code Status,  Allergies, Psychotropic, Insulin Sliding Scale Code Status Info: Full Allergies Info: Pravachol (Pravastatin) Psychotropic Info: Cymbalta  60mg  daily Insulin Sliding Scale Info: see DC summary       Current Medications (04/07/2024):  This is the current hospital active medication list Current Facility-Administered Medications  Medication Dose Route Frequency Provider Last Rate Last Admin   acetaminophen  (TYLENOL ) tablet 1,000 mg  1,000 mg Oral TID Amin, Ankit C, MD   1,000 mg at 04/07/24 1542   acetaminophen  (TYLENOL ) tablet 650 mg  650 mg Oral Q6H PRN Jillian Buttery, MD       amLODipine  (NORVASC ) tablet 10 mg  10 mg Oral Daily Adhikari, Amrit, MD   10 mg at 04/07/24 1028   aspirin  EC tablet 81 mg  81 mg Oral Daily Fernand Prost, MD   81 mg at 04/07/24 1029   bisacodyl (DULCOLAX) EC tablet 10 mg  10 mg Oral Q1500 Amin, Ankit C, MD   10 mg at 04/07/24 1542   clopidogrel  (PLAVIX ) tablet 75 mg  75 mg Oral Daily Khaliqdina, Salman, MD   75 mg at 04/07/24 1028   dexamethasone (DECADRON) injection 4 mg  4 mg Intravenous Q12H Amin, Ankit C, MD   4 mg at 04/07/24 1252   DULoxetine  (CYMBALTA ) DR capsule 60 mg  60 mg Oral Daily Khan, Ghalib, MD   60 mg at 04/07/24 1028   enoxaparin  (LOVENOX ) injection 40 mg  40 mg Subcutaneous Q24H Fernand Prost, MD  40 mg at 04/07/24 1027   fenofibrate  tablet 54 mg  54 mg Oral Daily Fernand Prost, MD   54 mg at 04/07/24 1037   hydrALAZINE (APRESOLINE) injection 10 mg  10 mg Intravenous Q4H PRN Amin, Ankit C, MD       insulin aspart (novoLOG) injection 0-9 Units  0-9 Units Subcutaneous TID WC Adhikari, Amrit, MD   2 Units at 04/07/24 1251   ipratropium-albuterol (DUONEB) 0.5-2.5 (3) MG/3ML nebulizer solution 3 mL  3 mL Nebulization Q4H PRN Amin, Ankit C, MD       irbesartan  (AVAPRO ) tablet 300 mg  300 mg Oral Daily Adhikari, Amrit, MD   300 mg at 04/07/24 1028   labetalol (NORMODYNE) injection 10 mg  10 mg Intravenous Q2H PRN Jillian Buttery, MD   10 mg at 04/04/24 2040    levETIRAcetam (KEPPRA) tablet 500 mg  500 mg Oral BID Khaliqdina, Salman, MD   500 mg at 04/07/24 1028   methocarbamol (ROBAXIN) tablet 500 mg  500 mg Oral Q6H PRN Jillian Buttery, MD       metoprolol  succinate (TOPROL -XL) 24 hr tablet 75 mg  75 mg Oral Daily Jerri Pfeiffer, MD   75 mg at 04/07/24 1027   oxyCODONE (Oxy IR/ROXICODONE) immediate release tablet 5 mg  5 mg Oral Q6H PRN Jillian Buttery, MD   5 mg at 04/06/24 1937   pantoprazole  (PROTONIX ) EC tablet 40 mg  40 mg Oral Daily Khan, Ghalib, MD   40 mg at 04/07/24 1028   polyethylene glycol (MIRALAX / GLYCOLAX) packet 17 g  17 g Oral BID Amin, Ankit C, MD   17 g at 04/07/24 1028   rosuvastatin  (CRESTOR ) tablet 40 mg  40 mg Oral Daily Khan, Ghalib, MD   40 mg at 04/07/24 1028   spironolactone  (ALDACTONE ) tablet 25 mg  25 mg Oral Daily Jillian Buttery, MD   25 mg at 04/07/24 1029   topiramate  (TOPAMAX ) tablet 25 mg  25 mg Oral BID Jerri Pfeiffer, MD   25 mg at 04/07/24 1028     Discharge Medications: Please see discharge summary for a list of discharge medications.  Relevant Imaging Results:  Relevant Lab Results:   Additional Information SS#: [id number]  Almarie [name], LCSW

## 2024-04-07 NOTE — Telephone Encounter (Signed)
 Sorry to hear this. I've looked through his notes. Happy to see him for HFU once he is discharged. I'll be thinking of them!

## 2024-04-07 NOTE — TOC Progression Note (Signed)
 Transition of Care [facility]) - Progression Note    Patient Details  Name: Jerome Cardenas MRN: [id number] Date of Birth: 14-Jul-1950  [facility]) CM/SW Contact  Almarie [name], [name] Phone Number: 04/07/2024, 4:09 PM  Clinical Narrative:   CSW updated by PT that recommendations being updated to SNF, patient and spouse in agreement. CSW completed referral, faxed out, will follow up with bed offers.    Expected Discharge Plan: Skilled Nursing Facility Barriers to Discharge: Continued Medical Work up               Expected Discharge Plan and Services   Discharge Planning Services: CM Consult Post Acute Care Choice: Home Health Living arrangements for the past 2 months: Single Family Home                           HH Arranged: PT, OT HH Agency: [facility] Date [facility] Agency Contacted: 04/06/24   Representative spoke with at [facility] Agency: Darleene   Social Drivers of Health (SDOH) Interventions SDOH Screenings   Food Insecurity: Low Risk  (08/16/2023)   Received from [facility]  Housing: Low Risk  (10/11/2023)   Received from [facility]  Transportation Needs: No Transportation Needs (08/16/2023)   Received from [facility]  Utilities: Low Risk  (08/16/2023)   Received from [facility]  Financial Resource Strain: Low Risk  (05/02/2022)   Received from [facility] visits prior to 09/08/2022., [facility]  Physical Activity: Unknown (05/02/2022)   [facility] from [facility] visits prior to 09/08/2022., [facility]  Social Connections: Unknown (05/02/2022)   [facility], [facility] visits prior to 09/08/2022.  Stress: Stress Concern Present (05/02/2022)   [facility] from [facility], [facility] visits prior to 09/08/2022.  Tobacco Use: Low Risk  (04/03/2024)    Readmission Risk Interventions     No data to display

## 2024-04-07 NOTE — Progress Notes (Signed)
 Physical Therapy Treatment Patient Details Name: Jerome Cardenas MRN: [id number] DOB: Jul 07, 1951 Today's Date: 04/07/2024   History of Present Illness 73 yo male presents to [facility] on 9/26 with possible seizure activity at home. Of note, fall on 7/30 with head trauma and +LOC. MRI brain shows left BG small infarct and left parietal occipital punctate periventricular infarct. Being treated for aspiration PNA. MRI showing acute compression fractures involving the superior endplates of  L1, L2, and L4. Associated mild central height loss of up to 20%  without significant retropulsion. Also with acute compression fx T12. PMH includes HTN, HLD, recurrent prostate cancer, DMII, L thalamic cva.    PT Comments  Pt not making significant progress towards his physical therapy goals. Imaging yesterday revealed acute compression fractures T12, L1, L2, L4. Pt requiring moderate assist for bed mobility and assisted with donning TLSO sitting edge of bed. Min-mod assist for transfers and ambulating 55 ft with a walker and close chair follow. Pt demonstrates right drift and requires manual assist for steering walker and negotiating obstacles. Pt attempted to sit on toilet to have a bowel movement, but was unable to do so. In light of regression, atient will benefit from continued inpatient follow up therapy, <3 hours/day to address deficits, maximize functional mobility and decrease caregiver burden.     If plan is discharge home, recommend the following: A little help with walking and/or transfers;A little help with bathing/dressing/bathroom   Can travel by private vehicle     Yes  Equipment Recommendations  Rolling walker (2 wheels)    Recommendations for Other Services       Precautions / Restrictions Precautions Precautions: Fall;Back Precaution Booklet Issued: No Recall of Precautions/Restrictions: Impaired Required Braces or Orthoses: Spinal Brace Spinal Brace: Thoracolumbosacral orthotic;Applied in  sitting position Restrictions Weight Bearing Restrictions Per Provider Order: No     Mobility  Bed Mobility Overal bed mobility: Needs Assistance Bed Mobility: Rolling, Sidelying to Sit Rolling: Min assist Sidelying to sit: Mod assist       General bed mobility comments: Verbal cues for log roll technique, assist with bed pad to bring hips over into sidelying position, step by step instruction for sequencing, trunk assist to upright    Transfers Overall transfer level: Needs assistance Equipment used: Rolling walker (2 wheels) Transfers: Sit to/from Stand Sit to Stand: Min assist, Mod assist           General transfer comment: Min-modA to power up to stand, cues for hand placement and for bilateral quad activation    Ambulation/Gait Ambulation/Gait assistance: Min assist, +2 safety/equipment Gait Distance (Feet): 55 Feet Assistive device: Rolling walker (2 wheels) Gait Pattern/deviations: Step-through pattern, Decreased stride length, Shuffle, Drifts right/left, Trunk flexed Gait velocity: decr Gait velocity interpretation: <1.8 ft/sec, indicate of risk for recurrent falls   General Gait Details: Drift towards R, requires manual assist to steer walker and prevent running into obstacles, close chair follow   Stairs             Wheelchair Mobility     Tilt Bed    Modified Rankin (Stroke Patients Only)       Balance Overall balance assessment: Needs assistance Sitting-balance support: Feet supported, No upper extremity supported Sitting balance-Leahy Scale: Fair     Standing balance support: Bilateral upper extremity supported, During functional activity, Reliant on assistive device for balance Standing balance-Leahy Scale: Poor  Communication Communication Communication: Impaired Factors Affecting Communication: Other (comment) (soft spoken)  Cognition Arousal: Alert Behavior During Therapy: Flat affect    PT - Cognitive impairments: History of cognitive impairments, Problem solving, Safety/Judgement, Sequencing                       PT - Cognition Comments: sequencing assist needed especially during bed mobility, slowed processing. Per chart review, wife states pt has been having cognitive changes Following commands: Impaired Following commands impaired: Follows one step commands with increased time    Cueing Cueing Techniques: Verbal cues, Gestural cues  Exercises      General Comments        Pertinent Vitals/Pain Pain Assessment Pain Assessment: Faces Faces Pain Scale: Hurts even more Pain Location: lower back Pain Descriptors / Indicators: Grimacing, Guarding, Sharp, Aching Pain Intervention(s): Limited activity within patient's tolerance, Monitored during session    Home Living                          Prior Function            PT Goals (current goals can now be found in the care plan section) Acute Rehab PT Goals Patient Stated Goal: pt spouse wants to be able to manage him at home Potential to Achieve Goals: Good Progress towards PT goals: Not progressing toward goals - comment    Frequency    Min 2X/week      PT Plan      Co-evaluation              AM-PAC PT 6 Clicks Mobility   Outcome Measure  Help needed turning from your back to your side while in a flat bed without using bedrails?: A Little Help needed moving from lying on your back to sitting on the side of a flat bed without using bedrails?: A Little Help needed moving to and from a bed to a chair (including a wheelchair)?: A Little Help needed standing up from a chair using your arms (e.g., wheelchair or bedside chair)?: A Lot Help needed to walk in hospital room?: A Little Help needed climbing 3-5 steps with a railing? : A Lot 6 Click Score: 16    End of Session Equipment Utilized During Treatment: Gait belt Activity Tolerance: Patient tolerated treatment  well Patient left: with call bell/phone within reach;in chair;with family/visitor present;with chair alarm set Nurse Communication: Mobility status PT Visit Diagnosis: Other abnormalities of gait and mobility (R26.89);Muscle weakness (generalized) (M62.81)     Time: 8594-8573 PT Time Calculation (min) (ACUTE ONLY): 21 min  Charges:    $Therapeutic Activity: 8-22 mins PT General Charges $$ ACUTE PT VISIT: 1 Visit                     Aleck Daring, PT, DPT Acute Rehabilitation Services Office [phone]    Aleck ONEIDA Daring 04/07/2024, 2:52 PM

## 2024-04-07 NOTE — Progress Notes (Addendum)
 PROGRESS NOTE    Jerome Cardenas  FMW:[id number] DOB: 1950/08/12 DOA: 04/03/2024 PCP: Evangelina Tinnie Norris, PA-C    Brief Narrative:    73 year old male with history of hypertension, hyperlipidemia, recurrent stroke, prostate cancer with spinal mets, type 2 diabetes , CVA who was brought to the to the emergency department for new onset seizure.  His wife found him on the floor and witnessed her seizure.  He was postictal afterwards and also had weakness.  On presentation, he was hemodynamically stable.  Lab work showed potassium of 3.1, troponin of 146, D-dimer 3.9.  CTA did not show any PE but also showed  possible mild opacification over the superior segment right lowerlobe which could be due to infection versus atelectasis, although may be due to artifact .  CT head did not show any acute findings.  MRI of the brain showed acute 7 mm nonhemorrhagic infarct within the inferior left lentiform nucleus, 4 mm dural-based lesion extending inferiorly from the left side of the tentorium, most consistent with a meningioma.  Neurology consulted , stroke workup completed.  PT recommend home health discharge.  Unfortunately due to persistent abdominal pain and back pain additional workup revealed colonic ileus and multiple areas of compression fracture requiring conservative management as mentioned below.  Assessment & Plan:   Seizure, new onset Likely associated with acute stroke.  Seen by neurology, started Keppra. -EEG did not show seizure or epileptiform discharge.  Continue Keppra on discharge.  No driving until seizure-free for 6 months   Acute CVA  History of CVA in the past.  MRI showed cute 7 mm nonhemorrhagic infarct within the inferior left lentiform nucleus.   A1c of 6.1, LDL of 108.  Currently on aspirin  and Plavix .  Plan is to continue DAPT for 3 weeks followed by Plavix  alone .  Echo showed EF of 60 to 65%, no intracardiac source of emboli.  30 day event monitor arranged by Cards.    Abdominal distention/ileus Constipation - Seen on x-ray and CT scan.  No obvious obstruction.  Ambulate/mobilize patient as much as possible. Bowel regimen.   Back pain - Acute compression fracture of T12, L1, L2 and L4.  Some disc protrusion around L2 affecting nerve roots.  Discussed with neurosurgery PA Camie.  Agree with pain management, TLSO brace, Decadron and outpatient follow-up for kyphoplasty evaluation   Hypertension, uncontrolled Takes amlodipine , spironolactone , olmesartan, metoprolol  at home.  This can slowly be normalized over the next week   History of prostate cancer with spinal mets/Back pain  Follows with urology, oncology(Dr. Madison at [facility]).  On Xtandi  and leuprolide every 3 months. No obvious metastatic dz noted on Spinal MRI.    Suspected pneumonia  Chest x-ray was clear.  But CT also showed  possible mild opacification over the superior segment right lowerlobe, likely artifact.  Antibiotics discontinued   GERD  Continue PPI   Hypokalemia  Supplemented and corrected   History of hyperlipidemia  Takes fenofibrate , Crestor    History of BPH  Takes finasteride, Flomax    Elevated troponin: Flat trend.  No chest pain.   Likely demand ischemia. Echo did not show any regional wall motion abnormalities   Cognitive impairment  Chronic problem. Continue delirium precaution, frequent orientation   DVT prophylaxis: Lovenox     Code Status: Full Code Family Communication: Spouse updated Status is: Inpatient Remains inpatient appropriate because: Hopefully discharge today   PT Follow up Recs: Home Health Pt9/28/2025 1330, face-to-face completed  Subjective: Seen at bedside, still reporting  of back pain  Examination:  General exam: Appears calm and comfortable  Respiratory system: Clear to auscultation. Respiratory effort normal. Cardiovascular system: S1 & S2 heard, RRR. No JVD, murmurs, rubs, gallops or clicks. No pedal edema. Gastrointestinal  system: Abdomen is distended with diminished bowel sounds Central nervous system: Alert and oriented. No focal neurological deficits. Extremities: Symmetric 5 x 5 power. Skin: No rashes, lesions or ulcers Psychiatry: Judgement and insight appear normal. Mood & affect appropriate.                Diet Orders (From admission, onward)     Start     Ordered   04/07/24 1038  Diet Carb Modified Fluid consistency: Thin; Room service appropriate? No  Diet effective now       Question Answer Comment  Diet-HS Snack? Nothing   Calorie Level Medium 1600-2000   Fluid consistency: Thin   Room service appropriate? No      04/07/24 1038            Objective: Vitals:   04/06/24 1136 04/06/24 1621 04/06/24 1933 04/07/24 0818  BP: (!) 160/85 (!) 155/83 (!) 157/93 119/81  Pulse: 70 73 78 71  Resp: 19 19 19 16   Temp: 98.6 F (37 C) 97.9 F (36.6 C) 98.4 F (36.9 C) 97.7 F (36.5 C)  TempSrc:  Oral Oral Oral  SpO2: 95% 98% 96% 96%  Weight:      Height:       No intake or output data in the 24 hours ending 04/07/24 1121 Filed Weights   04/03/24 0851  Weight: 92.1 kg    Scheduled Meds:  acetaminophen   1,000 mg Oral TID   amLODipine   10 mg Oral Daily   aspirin  EC  81 mg Oral Daily   bisacodyl  10 mg Oral Q1500   clopidogrel   75 mg Oral Daily   dexamethasone (DECADRON) injection  4 mg Intravenous Q12H   DULoxetine   60 mg Oral Daily   enoxaparin  (LOVENOX ) injection  40 mg Subcutaneous Q24H   fenofibrate   54 mg Oral Daily   insulin aspart  0-9 Units Subcutaneous TID WC   irbesartan   300 mg Oral Daily   levETIRAcetam  500 mg Oral BID   metoprolol  succinate  75 mg Oral Daily   pantoprazole   40 mg Oral Daily   polyethylene glycol  17 g Oral BID   rosuvastatin   40 mg Oral Daily   simethicone  80 mg Oral QID   spironolactone   25 mg Oral Daily   topiramate   25 mg Oral BID   Continuous Infusions:  Nutritional status     Body mass index is 30.87 kg/m.  Data Reviewed:    CBC: Recent Labs  Lab 04/03/24 0901 04/05/24 0506 04/07/24 0209  WBC 11.2* 9.1 10.3  HGB 15.8 14.6 15.7  HCT 43.7 40.1 43.5  MCV 85.7 86.1 86.1  PLT 164 148* 188   Basic Metabolic Panel: Recent Labs  Lab 04/03/24 0901 04/03/24 1518 04/04/24 1020 04/05/24 0506 04/07/24 0543  NA 137  --  138 136 132*  K 3.1*  --  3.1* 4.0 4.0  CL 101  --  111 103 100  CO2 20*  --  19* 21* 20*  GLUCOSE 193*  --  174* 193* 184*  BUN 14  --  8 9 19   CREATININE 1.18  --  0.74 0.91 0.99  CALCIUM  9.2  --  7.3* 8.8* 8.9  MG  --  1.7  --   --  1.9  PHOS  --   --   --   --  4.4   GFR: Estimated Creatinine Clearance: 72.1 mL/min (by C-G formula based on SCr of 0.99 mg/dL). Liver Function Tests: No results for input(s): AST, ALT, ALKPHOS, BILITOT, PROT, ALBUMIN in the last 168 hours. No results for input(s): LIPASE, AMYLASE in the last 168 hours. No results for input(s): AMMONIA in the last 168 hours. Coagulation Profile: No results for input(s): INR, PROTIME in the last 168 hours. Cardiac Enzymes: No results for input(s): CKTOTAL, CKMB, CKMBINDEX, TROPONINI in the last 168 hours. BNP (last 3 results) No results for input(s): PROBNP in the last 8760 hours. HbA1C: No results for input(s): HGBA1C in the last 72 hours. CBG: Recent Labs  Lab 04/06/24 0609 04/06/24 1137 04/06/24 1622 04/06/24 2123 04/07/24 0631  GLUCAP 213* 159* 172* 161* 189*   Lipid Profile: No results for input(s): CHOL, HDL, LDLCALC, TRIG, CHOLHDL, LDLDIRECT in the last 72 hours. Thyroid  Function Tests: No results for input(s): TSH, T4TOTAL, FREET4, T3FREE, THYROIDAB in the last 72 hours. Anemia Panel: No results for input(s): VITAMINB12, FOLATE, FERRITIN, TIBC, IRON, RETICCTPCT in the last 72 hours. Sepsis Labs: No results for input(s): PROCALCITON, LATICACIDVEN in the last 168 hours.  Recent Results (from the past 240 hours)  Blood culture  (routine x 2)     Status: None (Preliminary result)   Collection Time: 04/03/24  3:18 PM   Specimen: BLOOD  Result Value Ref Range Status   Specimen Description BLOOD SITE NOT SPECIFIED  Final   Special Requests   Final    BOTTLES DRAWN AEROBIC AND ANAEROBIC Blood Culture adequate volume   Culture   Final    NO GROWTH 4 DAYS Performed at [facility], [address] [place], [name] [address]    Report Status PENDING  Incomplete  Blood culture (routine x 2)     Status: None (Preliminary result)   Collection Time: 04/03/24  3:25 PM   Specimen: BLOOD  Result Value Ref Range Status   Specimen Description BLOOD SITE NOT SPECIFIED  Final   Special Requests   Final    BOTTLES DRAWN AEROBIC AND ANAEROBIC Blood Culture adequate volume   Culture   Final    NO GROWTH 4 DAYS Performed at [facility], [address] [place], [name] [address]    Report Status PENDING  Incomplete         Radiology Studies: CT ABDOMEN PELVIS W CONTRAST Result Date: 04/06/2024 CLINICAL DATA:  Abdominal pain. EXAM: CT ABDOMEN AND PELVIS WITH CONTRAST TECHNIQUE: Multidetector CT imaging of the abdomen and pelvis was performed using the standard protocol following bolus administration of intravenous contrast. RADIATION DOSE REDUCTION: This exam was performed according to the departmental dose-optimization program which includes automated exposure control, adjustment of the mA and/or kV according to patient size and/or use of iterative reconstruction technique. CONTRAST:  75mL OMNIPAQUE  IOHEXOL  350 MG/ML SOLN COMPARISON:  Lumbar spine MRI dated 04/06/2024 and CT abdomen pelvis dated 10/15/2023. FINDINGS: Evaluation of this exam is limited due to respiratory motion. Lower chest: The visualized lung bases are clear. No intra-abdominal free air.  Small free fluid in the pelvis. Hepatobiliary: Subcentimeter hypodense lesion in the right lobe of the liver is too small to characterize, likely a cyst or  hemangioma. The liver is otherwise unremarkable. Cholecystectomy. Pancreas: Unremarkable. No pancreatic ductal dilatation or surrounding inflammatory changes. Spleen: Normal in size without focal abnormality. Adrenals/Urinary Tract: The adrenal glands unremarkable. Mild  bilateral renal parenchyma atrophy and cortical irregularity and scarring. There is no hydronephrosis on either side. There is symmetric enhancement and excretion of contrast by both kidneys. The visualized ureters and urinary bladder appear unremarkable. Stomach/Bowel: Mild diffuse air distention of the colon. No mechanical obstruction or twisting. There is no bowel obstruction. Appendectomy. Vascular/Lymphatic: Mild aortoiliac atherosclerotic disease. The IVC is unremarkable breath no portal venous gas. There is no adenopathy. Reproductive: Prostate brachytherapy seeds. Other: None Musculoskeletal: Osteopenia with degenerative changes of the spine. Acute compression fracture of superior endplate of T12. Additional compression fractures of superior endplates of L2 and L4. These are better seen and evaluated on the lumbar spine MRI. Please refer to the MRI of the lumbar spine. IMPRESSION: 1. No acute intra-abdominal or pelvic pathology. 2. Acute compression fracture of superior endplate of T12. Additional compression fractures of superior endplates of L2 and L4. See report for the spine MRI. 3.  Aortic Atherosclerosis (ICD10-I70.0). Electronically Signed   By: Vanetta Chou M.D.   On: 04/06/2024 20:50   MR LUMBAR SPINE W WO CONTRAST Result Date: 04/06/2024 CLINICAL DATA:  Initial evaluation for worsening back pain. History of prostate cancer. EXAM: MRI LUMBAR SPINE WITHOUT AND WITH CONTRAST TECHNIQUE: Multiplanar and multiecho pulse sequences of the lumbar spine were obtained without and with intravenous contrast. CONTRAST:  9mL GADAVIST  GADOBUTROL  1 MMOL/ML IV SOLN COMPARISON:  None Available. FINDINGS: Segmentation: Standard. Lowest  well-formed disc space labeled the L5-S1 level. Alignment: Physiologic with preservation of the normal lumbar lordosis. No listhesis. Vertebrae: Acute compression fractures involving the superior endplates of L1, L2, and L4 are seen. Associated mild central height loss of up to 20% without significant retropulsion. Additional acute compression fracture of T12 noted, described on corresponding MRI of the thoracic spine. These are benign/mechanical in appearance, with no visible underlying pathologic lesion. Underlying bone marrow signal intensity is diffusely heterogeneous. No visible discrete osseous lesions to suggest active metastatic disease. No other abnormal marrow edema or enhancement. Conus medullaris and cauda equina: Conus extends to the L1 level. Conus and cauda equina appear normal. Paraspinal and other soft tissues: Paraspinous soft tissues demonstrate no acute finding. Subcentimeter T2 hyperintense cyst noted at the upper pole the left kidney. Additional possible subcentimeter T1 hyperintense lesion at the interpolar left kidney (series 32, image 14), possibly a small proteinaceous and/or hemorrhagic cyst. These are likely benign, with no follow-up imaging recommended. Disc levels: L1-2: Negative interspace. Mild bilateral facet hypertrophy. No canal or foraminal stenosis. L2-3: Mild degenerative intervertebral disc space narrowing with disc desiccation diffuse disc bulge. Small left foraminal to extraforaminal disc protrusion closely approximates the exiting left L2 nerve root. No spinal stenosis. Mild left L2 foraminal narrowing. Right neural foramen remains patent. L3-4: Small left extraforaminal disc protrusion closely approximates the exiting left L3 nerve root (series 32, image 28). Mild facet spurring. No spinal stenosis. Mild bilateral L3 foraminal narrowing. L4-5: Disc desiccation with mild disc bulge. Mild facet hypertrophy. Mild narrowing of the right lateral recess. Central canal remains  patent. Mild bilateral L4 foraminal stenosis. L5-S1: Mild endplate spurring without significant disc bulge. Minimal facet spurring. No canal or foraminal stenosis. IMPRESSION: 1. Acute compression fractures involving the superior endplates of L1, L2, and L4. Associated mild central height loss of up to 20% without significant retropulsion. These are benign/mechanical in appearance, with no visible underlying pathologic lesion. 2. Additional acute compression fracture of T12, described on corresponding MRI of the thoracic spine. 3. Underlying diffusely heterogeneous marrow signal intensity, but with no  discrete osseous lesions to suggest active metastatic disease within the lumbar spine. 4. Small left foraminal to extraforaminal disc protrusions at L2-3 and L3-4, potentially affecting the exiting left L2 or L3 nerve roots respectively. 5. Mild bilateral L3 and L4 foraminal stenosis related to disc bulge and facet hypertrophy. Electronically Signed   By: Morene Hoard M.D.   On: 04/06/2024 18:38   MR THORACIC SPINE W WO CONTRAST Result Date: 04/06/2024 CLINICAL DATA:  Initial evaluation for worsening back pain, history of process can not cancer. EXAM: MRI THORACIC WITHOUT AND WITH CONTRAST TECHNIQUE: Multiplanar and multiecho pulse sequences of the thoracic spine were obtained without and with intravenous contrast. CONTRAST:  9mL GADAVIST  GADOBUTROL  1 MMOL/ML IV SOLN COMPARISON:  None Available. FINDINGS: Alignment: Physiologic with preservation of the normal thoracic kyphosis. Vertebrae: Acute compression fracture involving the superior endplate of T12 with mild 20% height loss and trace 2 mm bony retropulsion. No significant stenosis. This is benign/mechanical in appearance, with no visible underlying pathologic lesion. Additional acute compression deformities of L1 and L2 noted, described on corresponding MRI of the lumbar spine. Otherwise, vertebral body height maintained. Bone marrow signal intensity  diffusely heterogeneous. 2.2 cm benign hemangioma noted within the T7 vertebral body. No worrisome osseous lesions to suggest active metastatic disease at this time. No other abnormal marrow edema or enhancement. Cord:  Normal signal morphology.  No abnormal enhancement. Paraspinal and other soft tissues: Paraspinous soft tissues within normal limits. Subcentimeter T2 hyperintense left renal cyst noted, benign in appearance, no follow-up imaging recommended. Disc levels: Scattered ordinary for age multilevel disc desiccation with minor noncompressive disc bulging and reactive endplate spurring noted throughout the thoracic spine. Mild multilevel facet degeneration. No significant spinal stenosis. Foramina remain patent. No impingement. IMPRESSION: 1. Acute compression fracture involving the superior endplate of T12 with mild 20% height loss and trace 2 mm bony retropulsion. No significant stenosis. This is benign/mechanical in appearance, with no visible underlying pathologic lesion. 2. Additional acute compression deformities of L1 and L2, described on corresponding MRI of the lumbar spine. 3. Underlying heterogeneous marrow signal intensity without worrisome osseous lesion to suggest active metastatic disease within the thoracic spine. 4. Mild for age thoracic spondylosis without significant stenosis or neural impingement. Electronically Signed   By: Morene Hoard M.D.   On: 04/06/2024 18:31   MR CERVICAL SPINE W WO CONTRAST Result Date: 04/06/2024 CLINICAL DATA:  Initial evaluation for history of prostate cancer, worsening back pain. EXAM: MRI CERVICAL SPINE WITHOUT AND WITH CONTRAST TECHNIQUE: Multiplanar and multiecho pulse sequences of the cervical spine, to include the craniocervical junction and cervicothoracic junction, were obtained without and with intravenous contrast. CONTRAST:  9mL GADAVIST  GADOBUTROL  1 MMOL/ML IV SOLN COMPARISON:  CT from 02/05/2024. FINDINGS: Alignment: Examination  moderately degraded by motion artifact. Straightening with mild reversal of the normal cervical lordosis. No significant listhesis. Vertebrae: Vertebral body height maintained without acute or chronic fracture. Bone marrow signal intensity overall within normal limits. No visible worrisome osseous lesions or evidence for active metastatic disease within the cervical spine. Mild degenerate reactive endplate changes noted about the C3-4 through C6-7 interspaces. No other abnormal marrow edema or enhancement. Cord: Normal signal morphology.  No abnormal enhancement. Posterior Fossa, vertebral arteries, paraspinal tissues: Unremarkable. Disc levels: C2-C3: Small central disc protrusion mildly indents the ventral thecal sac. Left-sided uncovertebral spurring. Right greater than left facet hypertrophy. No spinal stenosis. Foramina remain patent. C3-C4: Degenerative disc space narrowing with diffuse disc osteophyte complex. Flattening of the ventral thecal  sac. Superimposed mild bilateral facet hypertrophy. Mild spinal stenosis. Severe right with moderate left C4 foraminal narrowing. C4-C5: Degenerative disc space narrowing with diffuse disc osteophyte complex. Flattening of the ventral thecal sac with resultant mild spinal stenosis. Severe left with moderate right C5 foraminal narrowing. C5-C6: Degenerate the with disc spacing with diffuse disc osteophyte complex. Mild spinal stenosis with severe left worse than right C6 foraminal narrowing. C6-C7: Mild disc bulge with uncovertebral spurring, asymmetric to the right. No significant spinal stenosis. Severe right with moderate left C7 foraminal narrowing. C7-T1: Minimal disc bulge. Left greater than right facet hypertrophy. No stenosis. IMPRESSION: 1. No MRI evidence for metastatic disease within the cervical spine. 2. Multilevel cervical spondylosis with resultant mild diffuse spinal stenosis at C3-4 through C5-6. 3. Multifactorial degenerative changes with resultant  multilevel foraminal narrowing as above. Notable findings include severe right with moderate left C4 foraminal stenosis, severe left with moderate right C5 foraminal narrowing, severe left worse than right C6 foraminal stenosis, with severe right and moderate left C7 foraminal narrowing. Electronically Signed   By: Morene Hoard M.D.   On: 04/06/2024 18:26   DG Abd 1 View Result Date: 04/06/2024 CLINICAL DATA:  Abdominal distension. EXAM: ABDOMEN - 1 VIEW COMPARISON:  CT 10/15/2023 FINDINGS: Surgical clips over the right upper quadrant. There multiple air-filled loops of large and small bowel. Mild ill for prominence of the cecum measuring 11.9 cm in diameter. Borderline dilatation of a single small bowel loop just above the left of midline in the mid abdomen measuring 3.0 cm. No free peritoneal air. No air-fluid levels. Findings suggesting mild compression fracture of L2 age indeterminate. Degenerative change of the spine and hips. Radiation seed implants over the prostate gland. IMPRESSION: 1. Nonspecific, nonobstructive bowel gas pattern with mild prominence of the cecum measuring 11.9 cm in diameter as well as borderline dilatation of a single small bowel loop in the mid abdomen. Findings may be due to ileus. Recommend follow-up serial abdominal films as clinically indicated. 2. Mild compression fracture of L2 age indeterminate. Electronically Signed   By: Toribio Agreste M.D.   On: 04/06/2024 12:02           LOS: 4 days   Time spent= 35 mins    Burgess JAYSON Dare, MD [facility]  If 7PM-7AM, please contact night-coverage  04/07/2024, 11:21 AM

## 2024-04-07 NOTE — Telephone Encounter (Signed)
 Jerome Cardenas Piles had a seizure on Friday and wife doesn't know what's going to happen at this point.

## 2024-04-08 ENCOUNTER — Inpatient Hospital Stay (HOSPITAL_COMMUNITY)

## 2024-04-08 DIAGNOSIS — R569 Unspecified convulsions: Secondary | ICD-10-CM | POA: Diagnosis not present

## 2024-04-08 LAB — URINALYSIS, ROUTINE W REFLEX MICROSCOPIC
Bacteria, UA: NONE SEEN
Bilirubin Urine: NEGATIVE
Glucose, UA: NEGATIVE mg/dL
Hgb urine dipstick: NEGATIVE
Ketones, ur: NEGATIVE mg/dL
Nitrite: NEGATIVE
Protein, ur: NEGATIVE mg/dL
Specific Gravity, Urine: 1.021 (ref 1.005–1.030)
pH: 5 (ref 5.0–8.0)

## 2024-04-08 LAB — BLOOD GAS, VENOUS
Acid-base deficit: 5.1 mmol/L — ABNORMAL HIGH (ref 0.0–2.0)
Bicarbonate: 19.9 mmol/L — ABNORMAL LOW (ref 20.0–28.0)
O2 Saturation: 86.6 %
Patient temperature: 36.4
pCO2, Ven: 35 mmHg — ABNORMAL LOW (ref 44–60)
pH, Ven: 7.36 (ref 7.25–7.43)
pO2, Ven: 53 mmHg — ABNORMAL HIGH (ref 32–45)

## 2024-04-08 LAB — BASIC METABOLIC PANEL WITH GFR
Anion gap: 15 (ref 5–15)
BUN: 35 mg/dL — ABNORMAL HIGH (ref 8–23)
CO2: 18 mmol/L — ABNORMAL LOW (ref 22–32)
Calcium: 9.9 mg/dL (ref 8.9–10.3)
Chloride: 98 mmol/L (ref 98–111)
Creatinine, Ser: 1.73 mg/dL — ABNORMAL HIGH (ref 0.61–1.24)
GFR, Estimated: 41 mL/min — ABNORMAL LOW (ref >60)
Glucose, Bld: 248 mg/dL — ABNORMAL HIGH (ref 70–99)
Potassium: 4.7 mmol/L (ref 3.5–5.1)
Sodium: 131 mmol/L — ABNORMAL LOW (ref 135–145)

## 2024-04-08 LAB — GLUCOSE, CAPILLARY
Glucose-Capillary: 263 mg/dL — ABNORMAL HIGH (ref 70–99)
Glucose-Capillary: 295 mg/dL — ABNORMAL HIGH (ref 70–99)
Glucose-Capillary: 349 mg/dL — ABNORMAL HIGH (ref 70–99)
Glucose-Capillary: 350 mg/dL — ABNORMAL HIGH (ref 70–99)

## 2024-04-08 LAB — CULTURE, BLOOD (ROUTINE X 2)
Culture: NO GROWTH
Culture: NO GROWTH
Special Requests: ADEQUATE
Special Requests: ADEQUATE

## 2024-04-08 LAB — AMMONIA: Ammonia: 38 umol/L — ABNORMAL HIGH (ref 9–35)

## 2024-04-08 LAB — CBC
HCT: 53.5 % — ABNORMAL HIGH (ref 39.0–52.0)
Hemoglobin: 19.7 g/dL — ABNORMAL HIGH (ref 13.0–17.0)
MCH: 31.2 pg (ref 26.0–34.0)
MCHC: 36.8 g/dL — ABNORMAL HIGH (ref 30.0–36.0)
MCV: 84.7 fL (ref 80.0–100.0)
Platelets: 290 K/uL (ref 150–400)
RBC: 6.32 MIL/uL — ABNORMAL HIGH (ref 4.22–5.81)
RDW: 12.2 % (ref 11.5–15.5)
WBC: 13.3 K/uL — ABNORMAL HIGH (ref 4.0–10.5)
nRBC: 0 % (ref 0.0–0.2)

## 2024-04-08 LAB — VITAMIN B12: Vitamin B-12: 1103 pg/mL — ABNORMAL HIGH (ref 180–914)

## 2024-04-08 LAB — TSH: TSH: 1.326 u[IU]/mL (ref 0.350–4.500)

## 2024-04-08 LAB — MAGNESIUM: Magnesium: 2.3 mg/dL (ref 1.7–2.4)

## 2024-04-08 MED ORDER — LACTATED RINGERS IV SOLN: INTRAVENOUS | Status: DC

## 2024-04-08 MED ORDER — DULOXETINE HCL 20 MG PO CPEP
20.0000 mg | ORAL_CAPSULE | ORAL | Status: DC
Start: 2024-04-30 — End: 2024-05-14

## 2024-04-08 MED ORDER — CALCITONIN (SALMON) 200 UNIT/ACT NA SOLN
1.0000 | Freq: Every day | NASAL | Status: DC
  Administered 2024-04-09: 1 via NASAL
  Filled 2024-04-08: qty 3.7

## 2024-04-08 MED ORDER — DULOXETINE HCL 20 MG PO CPEP
40.0000 mg | ORAL_CAPSULE | Freq: Every day | ORAL | Status: DC
  Filled 2024-04-08: qty 2

## 2024-04-08 MED ORDER — DULOXETINE HCL 30 MG PO CPEP
30.0000 mg | ORAL_CAPSULE | Freq: Every day | ORAL | Status: DC
Start: 2024-04-16 — End: 2024-04-23

## 2024-04-08 MED ORDER — DULOXETINE HCL 20 MG PO CPEP: 20.0000 mg | ORAL_CAPSULE | Freq: Every day | ORAL | Status: DC

## 2024-04-08 NOTE — Procedures (Signed)
 Patient Name: Jerome Cardenas  MRN: [id number]  Epilepsy Attending: Arlin MALVA Krebs  Referring Physician/Provider: Perri DELENA Meliton Mickey., MD  Date: 04/08/2024 Duration: 24.27 mins  Patient history: 73 y.o. male who was brought in by EMS after a first-time seizure at home. EEG to evaluate for seizure  Level of alertness: Awake  AEDs during EEG study: LEV  Technical aspects: This EEG study was done with scalp electrodes positioned according to the 10-20 International system of electrode placement. Electrical activity was reviewed with band pass filter of 1-70Hz , sensitivity of 7 uV/mm, display speed of 70mm/sec with a 60Hz  notched filter applied as appropriate. EEG data were recorded continuously and digitally stored.  Video monitoring was available and reviewed as appropriate.  Description: The posterior dominant rhythm consists of 8 Hz activity of moderate voltage (25-35 uV) seen predominantly in posterior head regions, symmetric and reactive to eye opening and eye closing. EEG showed intermittent generalized 3 to 6 Hz theta-delta slowing. Hyperventilation and photic stimulation were not performed.     ABNORMALITY - Intermittent slow, generalized  IMPRESSION: This study is suggestive of mild to moderate diffuse encephalopathy. No seizures or epileptiform discharges were seen throughout the recording.  Nilay Mangrum O Harvy Riera

## 2024-04-08 NOTE — Progress Notes (Addendum)
 PROGRESS NOTE    Jerome Cardenas  FMW:[id number] DOB: July 17, 1950 DOA: 04/03/2024 PCP: Evangelina Tinnie Norris, PA-C  Chief Complaint  Patient presents with   Seizures    Brief Narrative:   73 year old male with history of hypertension, hyperlipidemia, recurrent stroke, prostate cancer with spinal mets, type 2 diabetes , CVA who was brought to the to the emergency department for new onset seizure.  His wife found him on the floor and witnessed her seizure.  He was postictal afterwards and also had weakness.  On presentation, he was hemodynamically stable.  Lab work showed potassium of 3.1, troponin of 146, D-dimer 3.9.  CTA did not show any PE but also showed  possible mild opacification over the superior segment right lowerlobe which could be due to infection versus atelectasis, although may be due to artifact .  CT head did not show any acute findings.  MRI of the brain showed acute 7 mm nonhemorrhagic infarct within the inferior left lentiform nucleus, 4 mm dural-based lesion extending inferiorly from the left side of the tentorium, most consistent with Jeyson Deshotel meningioma.  Neurology consulted , stroke workup completed.  PT recommend home health discharge.  Unfortunately due to persistent abdominal pain and back pain additional workup revealed colonic ileus and multiple areas of compression fracture requiring conservative management as mentioned below.    Assessment & Plan:   Principal Problem:   Seizures (HCC) Active Problems:   Acute CVA (cerebrovascular accident) (HCC)  Acute Kidney Injury In setting of poor PO intake, follow with IVF UA If not improving, repeat abdominal imaging to r/o hydro  Acute Metabolic Encephalopathy Cognitive impairment Chronic cognitive impairment, but recently since hospitalized, worsened encephalopathy MRI from earlier during hospitalization showed acute stroke and meningioma  Delirium precautions VBG without hypercarbia, ammonia (mildly elevated, will  repeat), b12 elevated Pending TSH, b1, folate Repeat EEG as below  Suspect related to acute hospitalization, stroke, seizure, compression fractures and pain Will work up additionally as needed  Abdominal distention/ileus Constipation No acute intraabdominal process on CT 9/29, but mild diffuse air distension of colon - no obstruction Continue bowel regimen  Seizure, new onset - unclear etiology, related to recent stroke, fall?  Appreciate neurology assistance - keppra 500 mg BID -EEG as below .  Continue Keppra on discharge.     Per [place] statutes, patients with seizures are not allowed to drive until  they have been seizure-free for six months. Use caution when using heavy equipment or power tools. Avoid working on ladders or at heights. Take showers instead of baths. Ensure the water temperature is not too high on the home water heater. Do not go swimming alone. When caring for infants or small children, sit down when holding, feeding, or changing them to minimize risk of injury to the child in the event you have Isaiha Asare seizure. Also, Maintain good sleep hygiene. Avoid alcohol.  Acute CVA  MRI showed cute 7 mm nonhemorrhagic infarct within the inferior left lentiform nucleus.    CTA head/neck without LVO, chronic extracranial artery tortuosity, but little extracranial atheroscerlosis, chronically more advanced intracranial atheroscerlosis notable for increased ACA A2 segment irregularity and stenosis, mild to moderate.  Stable L>R mca segment irregularity and stenosis mild to moderate.  Improved appearance of bilateral PCA irregularity and stenosis.  A1c of 6.1, LDL of 108.   Currently on aspirin  and Plavix .  Plan is to continue DAPT for 3 weeks followed by Plavix  alone .  Echo showed EF of 60 to 65%, no  intracardiac source of emboli.  30 day event monitor arranged by Cards.    Back pain - Acute compression fracture of T12, L1, L2 and L4.  Some disc protrusion around L2 affecting  nerve roots.   Discussed with neurosurgery PA Camie.  Agree with pain management, TLSO brace, Decadron and outpatient follow-up for kyphoplasty evaluation - will hold decadron tonight and discuss further tmrw with AMS.   Hypertension, uncontrolled Takes amlodipine , spironolactone , olmesartan, metoprolol  at home.  This can slowly be normalized over the next week   History of prostate cancer with spinal mets/Back pain  Follows with urology, oncology(Dr. Madison at [facility]).  On Xtandi  and leuprolide every 3 months. No obvious metastatic dz noted on Spinal MRI.    Suspected pneumonia  Chest x-ray was clear.  But CT also showed  possible mild opacification over the superior segment right lowerlobe, likely artifact.  Antibiotics discontinued   GERD  Continue PPI   Hypokalemia  Supplemented and corrected   History of hyperlipidemia  Takes fenofibrate , Crestor    History of BPH  Takes finasteride, Flomax    Elevated troponin: Flat trend.  No chest pain.   Likely demand ischemia. Echo did not show any regional wall motion abnormalities   Depression  Anxiety Wife requesting d/c cymbalta , will taper   Chronic problem. Continue delirium precaution, frequent orientation    DVT prophylaxis: lovenox  Code Status: full Family Communication: wife Disposition:   Status is: Inpatient Remains inpatient appropriate because: need for continued inpatient care   Consultants:  neurology  Procedures:  EEG IMPRESSION: This study is suggestive of mild to moderate diffuse encephalopathy. No seizures or epileptiform discharges were seen throughout the recording.  EEG Impression: This EEG was obtained while awake and asleep and is normal.   Echo MPRESSIONS     1. Left ventricular ejection fraction, by estimation, is 60 to 65%. The  left ventricle has normal function. The left ventricle has no regional  wall motion abnormalities. Left ventricular diastolic parameters are  consistent with  Grade I diastolic  dysfunction (impaired relaxation).   2. Right ventricular systolic function is normal. The right ventricular  size is normal.   3. The mitral valve is normal in structure. No evidence of mitral valve  regurgitation. No evidence of mitral stenosis.   4. The aortic valve is tricuspid. Aortic valve regurgitation is trivial.  No aortic stenosis is present.   5. The inferior vena cava is normal in size with greater than 50%  respiratory variability, suggesting right atrial pressure of 3 mmHg.    Antimicrobials:  Anti-infectives (From admission, onward)    Start     Dose/Rate Route Frequency Ordered Stop   04/03/24 1330  cefTRIAXone  (ROCEPHIN ) 1 g in sodium chloride  0.9 % 100 mL IVPB        1 g 200 mL/hr over 30 Minutes Intravenous  Once 04/03/24 1324 04/03/24 1441   04/03/24 1330  azithromycin  (ZITHROMAX ) 500 mg in sodium chloride  0.9 % 250 mL IVPB        500 mg 250 mL/hr over 60 Minutes Intravenous  Once 04/03/24 1324 04/03/24 1508       Subjective: He doesn't speak Wife concerned about poor PO intake, change in mental status - at baseline he's not Marley Charlot talker, but this is much differernt - typically walks with Lisett Dirusso cane, drives, doesn't cook or clean, but feeds himself, dresses himself.  Hasn't been eating drinking well since he's been here  Objective: Vitals:   04/08/24 1111 04/08/24 1504  04/08/24 1813 04/08/24 2021  BP: (!) 143/105 (!) 167/121 (!) 144/112 (!) 176/118  Pulse: (!) 107 (!) 108  95  Resp: 20 18  18   Temp: 98.8 F (37.1 C)  97.9 F (36.6 C) 97.8 F (36.6 C)  TempSrc: Rectal  Oral Oral  SpO2: 96% 96%  93%  Weight:      Height:        Intake/Output Summary (Last 24 hours) at 04/08/2024 2044 Last data filed at 04/08/2024 1555 Gross per 24 hour  Intake 33.71 ml  Output 250 ml  Net -216.29 ml   Filed Weights   04/03/24 0851  Weight: 92.1 kg    Examination:  General exam: ill appearing Respiratory system: unlabored Cardiovascular system:  RRR Gastrointestinal system: Abdomen is  distended and nontender Central nervous system: follows commands inconsistently, doesn't speak - moves all extremities - flat affet Extremities: no LEE    Data Reviewed: I have personally reviewed following labs and imaging studies  CBC: Recent Labs  Lab 04/03/24 0901 04/05/24 0506 04/07/24 0209 04/08/24 0427  WBC 11.2* 9.1 10.3 13.3*  HGB 15.8 14.6 15.7 19.7*  HCT 43.7 40.1 43.5 53.5*  MCV 85.7 86.1 86.1 84.7  PLT 164 148* 188 290    Basic Metabolic Panel: Recent Labs  Lab 04/03/24 0901 04/03/24 1518 04/04/24 1020 04/05/24 0506 04/07/24 0543 04/08/24 0427  NA 137  --  138 136 132* 131*  K 3.1*  --  3.1* 4.0 4.0 4.7  CL 101  --  111 103 100 98  CO2 20*  --  19* 21* 20* 18*  GLUCOSE 193*  --  174* 193* 184* 248*  BUN 14  --  8 9 19  35*  CREATININE 1.18  --  0.74 0.91 0.99 1.73*  CALCIUM  9.2  --  7.3* 8.8* 8.9 9.9  MG  --  1.7  --   --  1.9 2.3  PHOS  --   --   --   --  4.4  --     GFR: Estimated Creatinine Clearance: 41.3 mL/min (Mayur Duman) (by C-G formula based on SCr of 1.73 mg/dL (H)).  Liver Function Tests: No results for input(s): AST, ALT, ALKPHOS, BILITOT, PROT, ALBUMIN in the last 168 hours.  CBG: Recent Labs  Lab 04/07/24 1704 04/07/24 2227 04/08/24 0607 04/08/24 1221 04/08/24 1601  GLUCAP 208* 201* 263* 295* 349*     Recent Results (from the past 240 hours)  Blood culture (routine x 2)     Status: None   Collection Time: 04/03/24  3:18 PM   Specimen: BLOOD  Result Value Ref Range Status   Specimen Description BLOOD SITE NOT SPECIFIED  Final   Special Requests   Final    BOTTLES DRAWN AEROBIC AND ANAEROBIC Blood Culture adequate volume   Culture   Final    NO GROWTH 5 DAYS Performed at [facility], [address], [place], [name] [address]    Report Status 04/08/2024 FINAL  Final  Blood culture (routine x 2)     Status: None   Collection Time: 04/03/24  3:25 PM   Specimen: BLOOD   Result Value Ref Range Status   Specimen Description BLOOD SITE NOT SPECIFIED  Final   Special Requests   Final    BOTTLES DRAWN AEROBIC AND ANAEROBIC Blood Culture adequate volume   Culture   Final    NO GROWTH 5 DAYS Performed at [facility], [address] [place], [name] [address]  Report Status 04/08/2024 FINAL  Final         Radiology Studies: EEG adult Result Date: 04/08/2024 Shelton Arlin KIDD, MD     04/08/2024  3:43 PM Patient Name: Jerome Cardenas MRN: [id number] Epilepsy Attending: Arlin KIDD Shelton Referring Physician/Provider: Perri DELENA Meliton Mickey., MD Date: 04/08/2024 Duration: 24.27 mins Patient history: 73 y.o. male who was brought in by EMS after Jamorion Gomillion first-time seizure at home. EEG to evaluate for seizure Level of alertness: Awake AEDs during EEG study: LEV Technical aspects: This EEG study was done with scalp electrodes positioned according to the 10-20 International system of electrode placement. Electrical activity was reviewed with band pass filter of 1-70Hz , sensitivity of 7 uV/mm, display speed of 20mm/sec with Jamielynn Wigley 60Hz  notched filter applied as appropriate. EEG data were recorded continuously and digitally stored.  Video monitoring was available and reviewed as appropriate. Description: The posterior dominant rhythm consists of 8 Hz activity of moderate voltage (25-35 uV) seen predominantly in posterior head regions, symmetric and reactive to eye opening and eye closing. EEG showed intermittent generalized 3 to 6 Hz theta-delta slowing. Hyperventilation and photic stimulation were not performed.   ABNORMALITY - Intermittent slow, generalized IMPRESSION: This study is suggestive of mild to moderate diffuse encephalopathy. No seizures or epileptiform discharges were seen throughout the recording. Priyanka O Yadav        Scheduled Meds:  acetaminophen   1,000 mg Oral TID   amLODipine   10 mg Oral Daily   aspirin  EC  81 mg Oral Daily   bisacodyl  10 mg Oral Q1500    calcitonin (salmon)  1 spray Alternating Nares Daily   clopidogrel   75 mg Oral Daily   DULoxetine   60 mg Oral Daily   enoxaparin  (LOVENOX ) injection  40 mg Subcutaneous Q24H   fenofibrate   54 mg Oral Daily   insulin aspart  0-9 Units Subcutaneous TID WC   irbesartan   300 mg Oral Daily   levETIRAcetam  500 mg Oral BID   metoprolol  succinate  75 mg Oral Daily   pantoprazole   40 mg Oral Daily   polyethylene glycol  17 g Oral BID   rosuvastatin   40 mg Oral Daily   spironolactone   25 mg Oral Daily   topiramate   25 mg Oral BID   Continuous Infusions:  lactated ringers 125 mL/hr at 04/08/24 1555     LOS: 5 days    Time spent: over 30 min     Meliton Perri, MD [facility]   To contact the attending provider between 7A-7P or the covering provider during after hours 7P-7A, please log into the web site [email or web address] and access using universal [place] password for that web site. If you do not have the password, please call the hospital operator.  04/08/2024, 8:44 PM

## 2024-04-08 NOTE — TOC Progression Note (Addendum)
 Transition of Care [facility]) - Progression Note    Patient Details  Name: Jerome Cardenas MRN: [id number] Date of Birth: 10/01/1950  [facility] CM/SW Contact  Almarie [name], [name] Phone Number: 04/08/2024, 12:10 PM  Clinical Narrative:   CSW met with patient's spouse to provide bed offers for SNF and answer questions. CSW stepped out due to patient care, will follow back up with spouse.  UPDATE: CSW met with spouse again to discuss SNF options. Spouse would like to choose [facility] when the patient is stable for discharge. Spouse worried that patient is not doing as well today, workup is ongoing. CSW to follow.   Expected Discharge Plan: Skilled Nursing Facility Barriers to Discharge: Continued Medical Work up               Expected Discharge Plan and Services   Discharge Planning Services: CM Consult Post Acute Care Choice: Home Health Living arrangements for the past 2 months: Single Family Home                           HH Arranged: PT, OT HH Agency: [facility] Date [facility] Agency Contacted: 04/06/24   Representative spoke with at [facility] Agency: Darleene   Social Drivers of Health (SDOH) Interventions SDOH Screenings   Food Insecurity: Low Risk  (08/16/2023)   Received from [facility]  Housing: Low Risk  (10/11/2023)   Received from [facility]  Transportation Needs: No Transportation Needs (08/16/2023)   Received from [facility]  Utilities: Low Risk  (08/16/2023)   Received from [facility]  Financial Resource Strain: Low Risk  (05/02/2022)   Received from [facility] visits prior to 09/08/2022., [facility]  Physical Activity: Unknown (05/02/2022)   Received from [facility] visits prior to 09/08/2022., [facility]  Social Connections: Unknown (05/02/2022)   Received from [facility], [facility] visits prior to 09/08/2022.  Stress: Stress Concern Present (05/02/2022)    Received from [facility], [facility] visits prior to 09/08/2022.  Tobacco Use: Low Risk  (04/03/2024)    Readmission Risk Interventions     No data to display

## 2024-04-08 NOTE — Plan of Care (Signed)
  Problem: Activity: Goal: Risk for activity intolerance will decrease Outcome: Progressing   Problem: Pain Managment: Goal: General experience of comfort will improve and/or be controlled Outcome: Progressing   Problem: Safety: Goal: Ability to remain free from injury will improve Outcome: Progressing   Problem: Skin Integrity: Goal: Risk for impaired skin integrity will decrease Outcome: Progressing   Problem: Elimination: Goal: Will not experience complications related to bowel motility Outcome: Progressing Goal: Will not experience complications related to urinary retention Outcome: Progressing   Problem: Clinical Measurements: Goal: Ability to maintain clinical measurements within normal limits will improve Outcome: Progressing Goal: Will remain free from infection Outcome: Progressing Goal: Diagnostic test results will improve Outcome: Progressing Goal: Respiratory complications will improve Outcome: Progressing Goal: Cardiovascular complication will be avoided Outcome: Progressing

## 2024-04-08 NOTE — Progress Notes (Signed)
 EEG complete - results pending

## 2024-04-08 DEATH — deceased

## 2024-04-09 ENCOUNTER — Telehealth: Payer: Self-pay | Admitting: Adult Health

## 2024-04-09 ENCOUNTER — Inpatient Hospital Stay (HOSPITAL_COMMUNITY)

## 2024-04-09 ENCOUNTER — Ambulatory Visit (HOSPITAL_COMMUNITY)

## 2024-04-09 DIAGNOSIS — R0603 Acute respiratory distress: Secondary | ICD-10-CM

## 2024-04-09 DIAGNOSIS — G9341 Metabolic encephalopathy: Secondary | ICD-10-CM

## 2024-04-09 DIAGNOSIS — N179 Acute kidney failure, unspecified: Secondary | ICD-10-CM

## 2024-04-09 DIAGNOSIS — I639 Cerebral infarction, unspecified: Secondary | ICD-10-CM

## 2024-04-09 DIAGNOSIS — R569 Unspecified convulsions: Secondary | ICD-10-CM | POA: Diagnosis not present

## 2024-04-09 DIAGNOSIS — I6930 Unspecified sequelae of cerebral infarction: Secondary | ICD-10-CM

## 2024-04-09 DIAGNOSIS — G9340 Encephalopathy, unspecified: Secondary | ICD-10-CM

## 2024-04-09 DIAGNOSIS — E878 Other disorders of electrolyte and fluid balance, not elsewhere classified: Secondary | ICD-10-CM

## 2024-04-09 DIAGNOSIS — E1165 Type 2 diabetes mellitus with hyperglycemia: Secondary | ICD-10-CM

## 2024-04-09 LAB — COMPREHENSIVE METABOLIC PANEL WITH GFR
ALT: 14 U/L (ref 0–44)
AST: 19 U/L (ref 15–41)
Albumin: 3.6 g/dL (ref 3.5–5.0)
Alkaline Phosphatase: 90 U/L (ref 38–126)
Anion gap: 20 — ABNORMAL HIGH (ref 5–15)
BUN: 74 mg/dL — ABNORMAL HIGH (ref 8–23)
CO2: 16 mmol/L — ABNORMAL LOW (ref 22–32)
Calcium: 9.4 mg/dL (ref 8.9–10.3)
Chloride: 96 mmol/L — ABNORMAL LOW (ref 98–111)
Creatinine, Ser: 3.06 mg/dL — ABNORMAL HIGH (ref 0.61–1.24)
GFR, Estimated: 21 mL/min — ABNORMAL LOW (ref >60)
Glucose, Bld: 367 mg/dL — ABNORMAL HIGH (ref 70–99)
Potassium: 5.1 mmol/L (ref 3.5–5.1)
Sodium: 132 mmol/L — ABNORMAL LOW (ref 135–145)
Total Bilirubin: 1 mg/dL (ref 0.0–1.2)
Total Protein: 7.1 g/dL (ref 6.5–8.1)

## 2024-04-09 LAB — CBC WITH DIFFERENTIAL/PLATELET
Basophils Absolute: 0 K/uL (ref 0.0–0.1)
Basophils Relative: 0 %
Eosinophils Absolute: 0 K/uL (ref 0.0–0.5)
Eosinophils Relative: 0 %
HCT: 49 % (ref 39.0–52.0)
Hemoglobin: 17.8 g/dL — ABNORMAL HIGH (ref 13.0–17.0)
Lymphocytes Relative: 4 %
Lymphs Abs: 1.1 K/uL (ref 0.7–4.0)
MCH: 31 pg (ref 26.0–34.0)
MCHC: 36.3 g/dL — ABNORMAL HIGH (ref 30.0–36.0)
MCV: 85.2 fL (ref 80.0–100.0)
Monocytes Absolute: 1.6 K/uL — ABNORMAL HIGH (ref 0.1–1.0)
Monocytes Relative: 6 %
Neutro Abs: 24.1 K/uL — ABNORMAL HIGH (ref 1.7–7.7)
Neutrophils Relative %: 90 %
Platelets: 344 K/uL (ref 150–400)
RBC: 5.75 MIL/uL (ref 4.22–5.81)
RDW: 12.2 % (ref 11.5–15.5)
WBC: 26.8 K/uL — ABNORMAL HIGH (ref 4.0–10.5)
nRBC: 0 % (ref 0.0–0.2)

## 2024-04-09 LAB — GLUCOSE, CAPILLARY
Glucose-Capillary: 397 mg/dL — ABNORMAL HIGH (ref 70–99)
Glucose-Capillary: 396 mg/dL — ABNORMAL HIGH (ref 70–99)
Glucose-Capillary: 357 mg/dL — ABNORMAL HIGH (ref 70–99)

## 2024-04-09 LAB — BLOOD GAS, ARTERIAL
Acid-base deficit: 5.4 mmol/L — ABNORMAL HIGH (ref 0.0–2.0)
Bicarbonate: 17.3 mmol/L — ABNORMAL LOW (ref 20.0–28.0)
O2 Saturation: 94.3 %
Patient temperature: 37
pCO2 arterial: 26 mmHg — ABNORMAL LOW (ref 32–48)
pH, Arterial: 7.43 (ref 7.35–7.45)
pO2, Arterial: 67 mmHg — ABNORMAL LOW (ref 83–108)

## 2024-04-09 LAB — BASIC METABOLIC PANEL WITH GFR
Anion gap: 20 — ABNORMAL HIGH (ref 5–15)
BUN: 76 mg/dL — ABNORMAL HIGH (ref 8–23)
CO2: 14 mmol/L — ABNORMAL LOW (ref 22–32)
Calcium: 8.9 mg/dL (ref 8.9–10.3)
Chloride: 98 mmol/L (ref 98–111)
Creatinine, Ser: 3.46 mg/dL — ABNORMAL HIGH (ref 0.61–1.24)
GFR, Estimated: 18 mL/min — ABNORMAL LOW (ref >60)
Glucose, Bld: 367 mg/dL — ABNORMAL HIGH (ref 70–99)
Potassium: 5.1 mmol/L (ref 3.5–5.1)
Sodium: 132 mmol/L — ABNORMAL LOW (ref 135–145)

## 2024-04-09 LAB — TROPONIN I (HIGH SENSITIVITY): Troponin I (High Sensitivity): 39 ng/L — ABNORMAL HIGH (ref <18)

## 2024-04-09 LAB — MAGNESIUM: Magnesium: 2.4 mg/dL (ref 1.7–2.4)

## 2024-04-09 LAB — BETA-HYDROXYBUTYRIC ACID: Beta-Hydroxybutyric Acid: 0.23 mmol/L (ref 0.05–0.27)

## 2024-04-09 LAB — MRSA NEXT GEN BY PCR, NASAL: MRSA by PCR Next Gen: NOT DETECTED

## 2024-04-09 LAB — PHOSPHORUS: Phosphorus: 6 mg/dL — ABNORMAL HIGH (ref 2.5–4.6)

## 2024-04-09 LAB — AMMONIA: Ammonia: 31 umol/L (ref 9–35)

## 2024-04-09 LAB — FOLATE: Folate: >20 ng/mL (ref >5.9)

## 2024-04-09 MED ORDER — ACETAMINOPHEN 650 MG RE SUPP: 650.0000 mg | Freq: Four times a day (QID) | RECTAL | Status: DC | PRN

## 2024-04-09 MED ORDER — DEXTROSE 50 % IV SOLN: 0.0000 mL | INTRAVENOUS | Status: DC | PRN

## 2024-04-09 MED ORDER — LACTATED RINGERS IV BOLUS
1000.0000 mL | Freq: Once | INTRAVENOUS | Status: AC
  Administered 2024-04-09: 1000 mL via INTRAVENOUS

## 2024-04-09 MED ORDER — POLYVINYL ALCOHOL 1.4 % OP SOLN: 1.0000 [drp] | Freq: Four times a day (QID) | OPHTHALMIC | Status: DC | PRN

## 2024-04-09 MED ORDER — LACTATED RINGERS IV SOLN: INTRAVENOUS | Status: DC

## 2024-04-09 MED ORDER — DEXTROSE IN LACTATED RINGERS 5 % IV SOLN: INTRAVENOUS | Status: DC

## 2024-04-09 MED ORDER — ENOXAPARIN SODIUM 30 MG/0.3ML IJ SOSY
30.0000 mg | PREFILLED_SYRINGE | INTRAMUSCULAR | Status: DC
Start: 2024-04-09 — End: 2024-04-09
  Filled 2024-04-09: qty 0.3

## 2024-04-09 MED ORDER — SODIUM CHLORIDE 0.9 % IV SOLN
INTRAVENOUS | Status: DC
Start: 2024-04-09 — End: 2024-04-09

## 2024-04-09 MED ORDER — INSULIN REGULAR(HUMAN) IN NACL 100-0.9 UT/100ML-% IV SOLN
INTRAVENOUS | Status: DC
Start: 2024-04-09 — End: 2024-04-09
  Administered 2024-04-09: 14 [IU]/h via INTRAVENOUS
  Filled 2024-04-09: qty 100

## 2024-04-09 MED ORDER — CHLORHEXIDINE GLUCONATE CLOTH 2 % EX PADS: 6.0000 | MEDICATED_PAD | Freq: Every day | CUTANEOUS | Status: DC

## 2024-04-09 MED ORDER — VANCOMYCIN HCL 2000 MG/400ML IV SOLN
2000.0000 mg | Freq: Once | INTRAVENOUS | Status: DC
  Filled 2024-04-09: qty 400

## 2024-04-09 MED ORDER — ACETAMINOPHEN 325 MG PO TABS: 650.0000 mg | ORAL_TABLET | Freq: Four times a day (QID) | ORAL | Status: DC | PRN

## 2024-04-09 MED ORDER — LEVETIRACETAM (KEPPRA) 500 MG/5 ML ADULT IV PUSH
500.0000 mg | Freq: Two times a day (BID) | INTRAVENOUS | Status: DC
  Filled 2024-04-09: qty 5

## 2024-04-09 MED ORDER — METRONIDAZOLE 500 MG/100ML IV SOLN
500.0000 mg | Freq: Three times a day (TID) | INTRAVENOUS | Status: DC
Start: 2024-04-09 — End: 2024-04-09
  Filled 2024-04-09: qty 100

## 2024-04-09 MED ORDER — SODIUM CHLORIDE 0.9 % IV SOLN
2.0000 g | Freq: Every day | INTRAVENOUS | Status: DC
  Administered 2024-04-09: 2 g via INTRAVENOUS
  Filled 2024-04-09: qty 12.5

## 2024-04-10 LAB — BLOOD CULTURE ID PANEL (REFLEXED) - BCID2

## 2024-04-12 LAB — CULTURE, BLOOD (ROUTINE X 2)

## 2024-04-14 LAB — CULTURE, BLOOD (ROUTINE X 2): Culture: NO GROWTH

## 2024-05-09 NOTE — Telephone Encounter (Signed)
 Pt's wife called wanting to inform provider that unfortunately his appt will have to be cx due to the pt passing away this morning.

## 2024-05-09 NOTE — Progress Notes (Signed)
 The patient was evaluated. He did not have heart sounds, lung sounds, pupils were unresponsive to light and dilated. He did not respond to verbal and tactile stimulus. He was pronounced dead at 9.38 am on May 08, 2024.

## 2024-05-09 NOTE — Progress Notes (Signed)
 MEWS score now RED. Resp. Rate at 40. Dr. Franky notified of status changes. States he is coming to bedside to see patient.

## 2024-05-09 NOTE — Death Summary Note (Addendum)
 DEATH SUMMARY   Patient Details  Name: Jerome Cardenas MRN: [id number] DOB: 1950-08-23 PCP:O'Connor, Tinnie Norris, PA-C  Admission/Discharge Information   Admit Date:  April 10, 2024  Date of Death: Date of Death: 2024-04-16  Time of Death: Time of Death: 0938  Length of Stay: 6   Principle Cause of death: Sepsis/acute metabolic encephalopathy  Hospital Diagnoses: Principal Problem:   Seizures (HCC) Active Problems:   Acute CVA (cerebrovascular accident) (HCC)   AKI (acute kidney injury)   Acute metabolic encephalopathy   Hospital Course: 73 year old male significant for prostate cancer with spinal mets presented on 8/25 with new onset seizures brain MRI showed a 7 mm infarct in the left lentiform nucleus and a 4 mm lesion likely a meningioma.  He was not a candidate for TNK or thrombectomy.  He was admitted under hospitalist service.  In addition he had other findings on his CT scan including T12 L2 and L4 acute fractures and an MRI neck showing C4-C7 foraminal stenosis.  Patient was managed for seizures with Keppra.  On 10/1 he became significantly obtunded. SABRA  He was found to have worsening AKI with creatinine going from 1-3, anion gap 20 and white cell count going from 13-26 though he was receiving dexamethasone for his fractures.  He became tachypneic hypotensive and brought to the ICU.  No clear source was found.  CT chest was negative.  UA was negative.  He was started on broad-spectrum antibiotics.  I got involved in his care when he was going from tachypnea to bradypnea with low blood pressure.  Multiple family discussions was had and the patient was DNR/DNI.  Subsequently I had a conversation with patient's wife and came to consensus that he would not want to be put on aggressive interventions and was made comfort measures he passed away on 04/16/24 at 9:38 AM.    Assessment and Plan: Assessment and plan for early morning admission to the ICU:  Acute metabolic  encephalopathy: etiology uncertain. CT head, chest, abd unrevealing. Probably driven by worsening AKI and acidosis. May be hypovolemic. There is some level of baseline cognitive impairment which has been markedly worse this admission so hard to tell how much of this is acute tonight.  - LR bolus - Trend chemistry - monitor UOP - ? Dialysis candidate - EEG pending   Respiratory distress: CT chest without clear etiology. Likely driven by acidosis - BiPAP to support work of breathing   CVA, middle cerebral artery: MRI from earlier during hospitalization showed acute stroke and meningioma. Not a candidate for TNK or IR.  New onset sz - stroke service has signed off with plans for clinic follow up.  - Continue Keppra. Will transition to IV - ASA/Plavix  3 weeks then plavix  alone.  - Plan for 30 day event monitor  Likely a small meningioma seen on the MRI.   DM, hyperglycemia steroid driven - Check beta hydroxybutyrate, Rule out DKA - Hydrate   AKI Anion gap acidosis - Hydrate - Consider bicarb - Consider nephro, HD?   Colonic Ileus - NPO   Back pain: Acute compression fracture T12, L1, L2, L4. - TLSO brace, decadron, and outpatient kyphoplasty eval.    Prostate Ca - Follows with urology, oncology (Dr. Madison at [facility]). On Xtandi  and leuprolide every 3 months. No obvious metastatic dz noted on Spinal MRI.    HTN/HLD - holding meds while NPO   GOC: - Dr Franky discussed with wife upon transfer to ICU. DNR DNI  Procedures:  EEG on 10/1: No active seizures. Routine EEG on 9/22: Normal.  Consultations: Neurology  The results of significant diagnostics from this hospitalization (including imaging, microbiology, ancillary and laboratory) are listed below for reference.   Significant Diagnostic Studies: CT CHEST ABDOMEN PELVIS WO CONTRAST Addendum Date: 2024/05/06 ADDENDUM #1 ADDENDUM: There is no lead point evident to suggest bowel obstruction.  ---------------------------------------------------- Electronically signed by: Evalene Coho MD 05/06/2024 08:54 AM EDT RP Workstation: [id number]   Result Date: 2024-05-06 ORIGINAL REPORT *EXAM: CT CHEST, ABDOMEN AND PELVIS WITHOUT CONTRAST 05/06/2024 04:49:32 AM TECHNIQUE: CT of the chest, abdomen and pelvis was performed without the administration of intravenous contrast. Multiplanar reformatted images are provided for review. Automated exposure control, iterative reconstruction, and/or weight based adjustment of the mA/kV was utilized to reduce the radiation dose to as low as reasonably achievable. COMPARISON: CT of the chest dated 04/03/2024 and CT of the abdomen and pelvis dated 04/06/2024. CLINICAL HISTORY: Dyspnea, chronic, unclear etiology. patient now scoring yellow muse. BP 147/99, pulse 103, resp. 28 rectal temp 99.5. O2 SAT on room air 92%. Patient very cool to touch with mottled skin. IS receiving LR at 125ml/hr but last output documented was around 3pm. Bladder scanned but only ; 40cc noted. Abdominal distention. Pale. FINDINGS: CHEST: MEDIASTINUM AND LYMPH NODES: Heart demonstrates mild calcific coronary artery disease. Pericardium is unremarkable. The central airways are clear. No mediastinal, hilar or axillary lymphadenopathy. There is mild calcific plaque within the aortic arch. There is fluid present within the thoracic esophagus, which is mildly distended. LUNGS AND PLEURA: There is mild atelectasis present independently within the lower lobes bilaterally. No focal consolidation or pulmonary edema. No pleural effusion or pneumothorax. ABDOMEN AND PELVIS: LIVER: The liver is unremarkable. GALLBLADDER AND BILE DUCTS: Patient is status post cholecystectomy. No biliary ductal dilatation. SPLEEN: No acute abnormality. PANCREAS: No acute abnormality. ADRENAL GLANDS: No acute abnormality. KIDNEYS, URETERS AND BLADDER: Punctate nonobstructive renal calculi present bilaterally. No  hydronephrosis. No perinephric or periureteral stranding. Urinary bladder is unremarkable. GI AND BOWEL: The stomach is moderately distended with air and fluid. Most of the small bowel is distended with fluid. The distal ileum is decompressed. Lead point evident to suggest obstruction. There is radiopaque material within the right colon. Findings suggest ileus. REPRODUCTIVE ORGANS: There are numerous prostate seed implants present. PERITONEUM AND RETROPERITONEUM: No ascites. No free air. VASCULATURE: The abdominal aorta demonstrates mild calcific atheromatous disease. ABDOMINAL AND PELVIS LYMPH NODES: No lymphadenopathy. BONES AND SOFT TISSUES: No acute osseous abnormality. No focal soft tissue abnormality. IMPRESSION: 1. Findings most consistent with ileus without evidence of mechanical obstruction 2. Mild coronary and aortic atherosclerosis 3. Mild bilateral lower lobe atelectasis Electronically signed by: Evalene Coho MD May 06, 2024 05:34 AM EDT RP Workstation: [id number]   CT HEAD WO CONTRAST ( ) Result Date: 06-May-2024 EXAM: CT HEAD WITHOUT CONTRAST 05/06/24 04:49:32 AM TECHNIQUE: CT of the head was performed without the administration of intravenous contrast. Automated exposure control, iterative reconstruction, and/or weight based adjustment of the mA/kV was utilized to reduce the radiation dose to as low as reasonably achievable. COMPARISON: CT of the head dated 04/04/2024. CLINICAL HISTORY: Meningitis/CNS infection suspected. Patient now scoring yellow MUSE. BP 147/99, pulse 103, resp. 28, rectal temp 99.5. O2 SAT on room air 92%. Patient very cool to touch with mottled skin. Is receiving LR at 125 ml/hr but last output documented was 250 ml around 3 pm. Bladder scanned but only 40 cc noted. Abdominal distention. Pale. FINDINGS: BRAIN AND VENTRICLES: No acute  hemorrhage. No evidence of acute infarct. No hydrocephalus. No extra-axial collection. No mass effect or midline shift. Age-related atrophy.  Moderate periventricular white matter disease. Chronic lacunar infarct present within the left thalamus. ORBITS: No acute abnormality. SINUSES: No acute abnormality. SOFT TISSUES AND SKULL: No acute soft tissue abnormality. No skull fracture. IMPRESSION: 1. No acute intracranial abnormality. 2. Age-related cerebral atrophy and moderate chronic microvascular ischemic changes. 3. Chronic lacunar infarct in the left thalamus. Electronically signed by: Evalene Coho MD April 21, 2024 05:35 AM EDT RP Workstation: [id number]   EEG adult Result Date: 04/08/2024 Shelton Arlin KIDD, MD     04/08/2024  3:43 PM Patient Name: CAROL LOFTIN MRN: [id number] Epilepsy Attending: Arlin KIDD Shelton Referring Physician/Provider: Perri DELENA Meliton Mickey., MD Date: 04/08/2024 Duration: 24.27 mins Patient history: 73 y.o. male who was brought in by EMS after a first-time seizure at home. EEG to evaluate for seizure Level of alertness: Awake AEDs during EEG study: LEV Technical aspects: This EEG study was done with scalp electrodes positioned according to the 10-20 International system of electrode placement. Electrical activity was reviewed with band pass filter of 1-70Hz , sensitivity of 7 uV/mm, display speed of 75mm/sec with a 60Hz  notched filter applied as appropriate. EEG data were recorded continuously and digitally stored.  Video monitoring was available and reviewed as appropriate. Description: The posterior dominant rhythm consists of 8 Hz activity of moderate voltage (25-35 uV) seen predominantly in posterior head regions, symmetric and reactive to eye opening and eye closing. EEG showed intermittent generalized 3 to 6 Hz theta-delta slowing. Hyperventilation and photic stimulation were not performed.   ABNORMALITY - Intermittent slow, generalized IMPRESSION: This study is suggestive of mild to moderate diffuse encephalopathy. No seizures or epileptiform discharges were seen throughout the recording. Arlin KIDD Shelton   CT ABDOMEN  PELVIS W CONTRAST Result Date: 04/06/2024 CLINICAL DATA:  Abdominal pain. EXAM: CT ABDOMEN AND PELVIS WITH CONTRAST TECHNIQUE: Multidetector CT imaging of the abdomen and pelvis was performed using the standard protocol following bolus administration of intravenous contrast. RADIATION DOSE REDUCTION: This exam was performed according to the departmental dose-optimization program which includes automated exposure control, adjustment of the mA and/or kV according to patient size and/or use of iterative reconstruction technique. CONTRAST:  75mL OMNIPAQUE  IOHEXOL  350 MG/ML SOLN COMPARISON:  Lumbar spine MRI dated 04/06/2024 and CT abdomen pelvis dated 10/15/2023. FINDINGS: Evaluation of this exam is limited due to respiratory motion. Lower chest: The visualized lung bases are clear. No intra-abdominal free air.  Small free fluid in the pelvis. Hepatobiliary: Subcentimeter hypodense lesion in the right lobe of the liver is too small to characterize, likely a cyst or hemangioma. The liver is otherwise unremarkable. Cholecystectomy. Pancreas: Unremarkable. No pancreatic ductal dilatation or surrounding inflammatory changes. Spleen: Normal in size without focal abnormality. Adrenals/Urinary Tract: The adrenal glands unremarkable. Mild bilateral renal parenchyma atrophy and cortical irregularity and scarring. There is no hydronephrosis on either side. There is symmetric enhancement and excretion of contrast by both kidneys. The visualized ureters and urinary bladder appear unremarkable. Stomach/Bowel: Mild diffuse air distention of the colon. No mechanical obstruction or twisting. There is no bowel obstruction. Appendectomy. Vascular/Lymphatic: Mild aortoiliac atherosclerotic disease. The IVC is unremarkable breath no portal venous gas. There is no adenopathy. Reproductive: Prostate brachytherapy seeds. Other: None Musculoskeletal: Osteopenia with degenerative changes of the spine. Acute compression fracture of superior  endplate of T12. Additional compression fractures of superior endplates of L2 and L4. These are better seen and evaluated on the lumbar spine  MRI. Please refer to the MRI of the lumbar spine. IMPRESSION: 1. No acute intra-abdominal or pelvic pathology. 2. Acute compression fracture of superior endplate of T12. Additional compression fractures of superior endplates of L2 and L4. See report for the spine MRI. 3.  Aortic Atherosclerosis (ICD10-I70.0). Electronically Signed   By: Vanetta Chou M.D.   On: 04/06/2024 20:50   MR LUMBAR SPINE W WO CONTRAST Result Date: 04/06/2024 CLINICAL DATA:  Initial evaluation for worsening back pain. History of prostate cancer. EXAM: MRI LUMBAR SPINE WITHOUT AND WITH CONTRAST TECHNIQUE: Multiplanar and multiecho pulse sequences of the lumbar spine were obtained without and with intravenous contrast. CONTRAST:  9mL GADAVIST  GADOBUTROL  1 MMOL/ML IV SOLN COMPARISON:  None Available. FINDINGS: Segmentation: Standard. Lowest well-formed disc space labeled the L5-S1 level. Alignment: Physiologic with preservation of the normal lumbar lordosis. No listhesis. Vertebrae: Acute compression fractures involving the superior endplates of L1, L2, and L4 are seen. Associated mild central height loss of up to 20% without significant retropulsion. Additional acute compression fracture of T12 noted, described on corresponding MRI of the thoracic spine. These are benign/mechanical in appearance, with no visible underlying pathologic lesion. Underlying bone marrow signal intensity is diffusely heterogeneous. No visible discrete osseous lesions to suggest active metastatic disease. No other abnormal marrow edema or enhancement. Conus medullaris and cauda equina: Conus extends to the L1 level. Conus and cauda equina appear normal. Paraspinal and other soft tissues: Paraspinous soft tissues demonstrate no acute finding. Subcentimeter T2 hyperintense cyst noted at the upper pole the left kidney.  Additional possible subcentimeter T1 hyperintense lesion at the interpolar left kidney (series 32, image 14), possibly a small proteinaceous and/or hemorrhagic cyst. These are likely benign, with no follow-up imaging recommended. Disc levels: L1-2: Negative interspace. Mild bilateral facet hypertrophy. No canal or foraminal stenosis. L2-3: Mild degenerative intervertebral disc space narrowing with disc desiccation diffuse disc bulge. Small left foraminal to extraforaminal disc protrusion closely approximates the exiting left L2 nerve root. No spinal stenosis. Mild left L2 foraminal narrowing. Right neural foramen remains patent. L3-4: Small left extraforaminal disc protrusion closely approximates the exiting left L3 nerve root (series 32, image 28). Mild facet spurring. No spinal stenosis. Mild bilateral L3 foraminal narrowing. L4-5: Disc desiccation with mild disc bulge. Mild facet hypertrophy. Mild narrowing of the right lateral recess. Central canal remains patent. Mild bilateral L4 foraminal stenosis. L5-S1: Mild endplate spurring without significant disc bulge. Minimal facet spurring. No canal or foraminal stenosis. IMPRESSION: 1. Acute compression fractures involving the superior endplates of L1, L2, and L4. Associated mild central height loss of up to 20% without significant retropulsion. These are benign/mechanical in appearance, with no visible underlying pathologic lesion. 2. Additional acute compression fracture of T12, described on corresponding MRI of the thoracic spine. 3. Underlying diffusely heterogeneous marrow signal intensity, but with no discrete osseous lesions to suggest active metastatic disease within the lumbar spine. 4. Small left foraminal to extraforaminal disc protrusions at L2-3 and L3-4, potentially affecting the exiting left L2 or L3 nerve roots respectively. 5. Mild bilateral L3 and L4 foraminal stenosis related to disc bulge and facet hypertrophy. Electronically Signed   By:  Morene Hoard M.D.   On: 04/06/2024 18:38   MR THORACIC SPINE W WO CONTRAST Result Date: 04/06/2024 CLINICAL DATA:  Initial evaluation for worsening back pain, history of process can not cancer. EXAM: MRI THORACIC WITHOUT AND WITH CONTRAST TECHNIQUE: Multiplanar and multiecho pulse sequences of the thoracic spine were obtained without and with intravenous contrast.  CONTRAST:  9mL GADAVIST  GADOBUTROL  1 MMOL/ML IV SOLN COMPARISON:  None Available. FINDINGS: Alignment: Physiologic with preservation of the normal thoracic kyphosis. Vertebrae: Acute compression fracture involving the superior endplate of T12 with mild 20% height loss and trace 2 mm bony retropulsion. No significant stenosis. This is benign/mechanical in appearance, with no visible underlying pathologic lesion. Additional acute compression deformities of L1 and L2 noted, described on corresponding MRI of the lumbar spine. Otherwise, vertebral body height maintained. Bone marrow signal intensity diffusely heterogeneous. 2.2 cm benign hemangioma noted within the T7 vertebral body. No worrisome osseous lesions to suggest active metastatic disease at this time. No other abnormal marrow edema or enhancement. Cord:  Normal signal morphology.  No abnormal enhancement. Paraspinal and other soft tissues: Paraspinous soft tissues within normal limits. Subcentimeter T2 hyperintense left renal cyst noted, benign in appearance, no follow-up imaging recommended. Disc levels: Scattered ordinary for age multilevel disc desiccation with minor noncompressive disc bulging and reactive endplate spurring noted throughout the thoracic spine. Mild multilevel facet degeneration. No significant spinal stenosis. Foramina remain patent. No impingement. IMPRESSION: 1. Acute compression fracture involving the superior endplate of T12 with mild 20% height loss and trace 2 mm bony retropulsion. No significant stenosis. This is benign/mechanical in appearance, with no visible  underlying pathologic lesion. 2. Additional acute compression deformities of L1 and L2, described on corresponding MRI of the lumbar spine. 3. Underlying heterogeneous marrow signal intensity without worrisome osseous lesion to suggest active metastatic disease within the thoracic spine. 4. Mild for age thoracic spondylosis without significant stenosis or neural impingement. Electronically Signed   By: Morene Hoard M.D.   On: 04/06/2024 18:31   MR CERVICAL SPINE W WO CONTRAST Result Date: 04/06/2024 CLINICAL DATA:  Initial evaluation for history of prostate cancer, worsening back pain. EXAM: MRI CERVICAL SPINE WITHOUT AND WITH CONTRAST TECHNIQUE: Multiplanar and multiecho pulse sequences of the cervical spine, to include the craniocervical junction and cervicothoracic junction, were obtained without and with intravenous contrast. CONTRAST:  9mL GADAVIST  GADOBUTROL  1 MMOL/ML IV SOLN COMPARISON:  CT from 02/05/2024. FINDINGS: Alignment: Examination moderately degraded by motion artifact. Straightening with mild reversal of the normal cervical lordosis. No significant listhesis. Vertebrae: Vertebral body height maintained without acute or chronic fracture. Bone marrow signal intensity overall within normal limits. No visible worrisome osseous lesions or evidence for active metastatic disease within the cervical spine. Mild degenerate reactive endplate changes noted about the C3-4 through C6-7 interspaces. No other abnormal marrow edema or enhancement. Cord: Normal signal morphology.  No abnormal enhancement. Posterior Fossa, vertebral arteries, paraspinal tissues: Unremarkable. Disc levels: C2-C3: Small central disc protrusion mildly indents the ventral thecal sac. Left-sided uncovertebral spurring. Right greater than left facet hypertrophy. No spinal stenosis. Foramina remain patent. C3-C4: Degenerative disc space narrowing with diffuse disc osteophyte complex. Flattening of the ventral thecal sac.  Superimposed mild bilateral facet hypertrophy. Mild spinal stenosis. Severe right with moderate left C4 foraminal narrowing. C4-C5: Degenerative disc space narrowing with diffuse disc osteophyte complex. Flattening of the ventral thecal sac with resultant mild spinal stenosis. Severe left with moderate right C5 foraminal narrowing. C5-C6: Degenerate the with disc spacing with diffuse disc osteophyte complex. Mild spinal stenosis with severe left worse than right C6 foraminal narrowing. C6-C7: Mild disc bulge with uncovertebral spurring, asymmetric to the right. No significant spinal stenosis. Severe right with moderate left C7 foraminal narrowing. C7-T1: Minimal disc bulge. Left greater than right facet hypertrophy. No stenosis. IMPRESSION: 1. No MRI evidence for metastatic disease within the  cervical spine. 2. Multilevel cervical spondylosis with resultant mild diffuse spinal stenosis at C3-4 through C5-6. 3. Multifactorial degenerative changes with resultant multilevel foraminal narrowing as above. Notable findings include severe right with moderate left C4 foraminal stenosis, severe left with moderate right C5 foraminal narrowing, severe left worse than right C6 foraminal stenosis, with severe right and moderate left C7 foraminal narrowing. Electronically Signed   By: Morene Hoard M.D.   On: 04/06/2024 18:26   DG Abd 1 View Result Date: 04/06/2024 CLINICAL DATA:  Abdominal distension. EXAM: ABDOMEN - 1 VIEW COMPARISON:  CT 10/15/2023 FINDINGS: Surgical clips over the right upper quadrant. There multiple air-filled loops of large and small bowel. Mild ill for prominence of the cecum measuring 11.9 cm in diameter. Borderline dilatation of a single small bowel loop just above the left of midline in the mid abdomen measuring 3.0 cm. No free peritoneal air. No air-fluid levels. Findings suggesting mild compression fracture of L2 age indeterminate. Degenerative change of the spine and hips. Radiation seed  implants over the prostate gland. IMPRESSION: 1. Nonspecific, nonobstructive bowel gas pattern with mild prominence of the cecum measuring 11.9 cm in diameter as well as borderline dilatation of a single small bowel loop in the mid abdomen. Findings may be due to ileus. Recommend follow-up serial abdominal films as clinically indicated. 2. Mild compression fracture of L2 age indeterminate. Electronically Signed   By: Toribio Agreste M.D.   On: 04/06/2024 12:02   EEG adult Result Date: 04/04/2024 Matthews Elida HERO, MD     04/04/2024  3:49 PM Routine EEG Report JEFFRIE LOFSTROM is a 73 y.o. male with a history of seizure who is undergoing an EEG to evaluate for seizures. Report: This EEG was acquired with electrodes placed according to the International 10-20 electrode system (including Fp1, Fp2, F3, F4, C3, C4, P3, P4, O1, O2, T3, T4, T5, T6, A1, A2, Fz, Cz, Pz). The following electrodes were missing or displaced: none. The occipital dominant rhythm was 9 Hz. This activity is reactive to stimulation. Drowsiness was manifested by background fragmentation; deeper stages of sleep were identified by K complexes and sleep spindles. There was no focal slowing. There were no interictal epileptiform discharges. There were no electrographic seizures identified. Impression: This EEG was obtained while awake and asleep and is normal.   Clinical Correlation: Normal EEGs, however, do not rule out epilepsy. Elida Matthews, MD [facility] [phone] If 7pm- 7am, please page neurology on call as listed in AMION.   ECHOCARDIOGRAM COMPLETE Result Date: 04/04/2024    ECHOCARDIOGRAM REPORT   Patient Name:   CARLISLE ENKE [facility] Date of Exam: 04/04/2024 Medical Rec #:  [id number]       Height:       68.0 in Accession #:    [id number]      Weight:       203.0 lb Date of Birth:  11-Nov-1950       BSA:          2.057 m Patient Age:    73 years        BP:           138/101 mmHg Patient Gender: M               HR:           100 bpm.  Exam Location:  Inpatient Procedure: 2D Echo, Color Doppler, Cardiac Doppler and 3D Echo (Both Spectral  and Color Flow Doppler were utilized during procedure). Indications:    Elevated Troponin  History:        Patient has prior history of Echocardiogram examinations, most                 recent 07/29/2022. Risk Factors:Hypertension and Diabetes.                 Cancer, Chronic Kidney Disease.  Sonographer:    Logan Shove RDCS Referring Phys: [id number] [facility] IMPRESSIONS  1. Left ventricular ejection fraction, by estimation, is 60 to 65%. The left ventricle has normal function. The left ventricle has no regional wall motion abnormalities. Left ventricular diastolic parameters are consistent with Grade I diastolic dysfunction (impaired relaxation).  2. Right ventricular systolic function is normal. The right ventricular size is normal.  3. The mitral valve is normal in structure. No evidence of mitral valve regurgitation. No evidence of mitral stenosis.  4. The aortic valve is tricuspid. Aortic valve regurgitation is trivial. No aortic stenosis is present.  5. The inferior vena cava is normal in size with greater than 50% respiratory variability, suggesting right atrial pressure of 3 mmHg. FINDINGS  Left Ventricle: Left ventricular ejection fraction, by estimation, is 60 to 65%. The left ventricle has normal function. The left ventricle has no regional wall motion abnormalities. The left ventricular internal cavity size was normal in size. There is  no left ventricular hypertrophy. Left ventricular diastolic parameters are consistent with Grade I diastolic dysfunction (impaired relaxation). Right Ventricle: The right ventricular size is normal. Right ventricular systolic function is normal. Left Atrium: Left atrial size was normal in size. Right Atrium: Right atrial size was normal in size. Pericardium: There is no evidence of pericardial effusion. Mitral Valve: The mitral valve is normal in structure. No  evidence of mitral valve regurgitation. No evidence of mitral valve stenosis. Tricuspid Valve: The tricuspid valve is normal in structure. Tricuspid valve regurgitation is trivial. No evidence of tricuspid stenosis. Aortic Valve: The aortic valve is tricuspid. Aortic valve regurgitation is trivial. No aortic stenosis is present. Aortic valve mean gradient measures 4.5 mmHg. Aortic valve peak gradient measures 9.3 mmHg. Aortic valve area, by VTI measures 1.91 cm. Pulmonic Valve: The pulmonic valve was normal in structure. Pulmonic valve regurgitation is not visualized. No evidence of pulmonic stenosis. Aorta: The aortic root is normal in size and structure. Venous: The inferior vena cava is normal in size with greater than 50% respiratory variability, suggesting right atrial pressure of 3 mmHg. IAS/Shunts: No atrial level shunt detected by color flow Doppler. Additional Comments: 3D was performed not requiring image post processing on an independent workstation and was normal.  LEFT VENTRICLE PLAX 2D LVIDd:         4.70 cm LVIDs:         3.30 cm LV PW:         1.00 cm LV IVS:        0.80 cm LVOT diam:     2.20 cm   3D Volume EF: LV SV:         54        3D EF:        56 % LV SV Index:   26        LV EDV:       118 ml LVOT Area:     3.80 cm  LV ESV:       52 ml  LV SV:        66 ml RIGHT VENTRICLE             IVC RV Basal diam:  2.30 cm     IVC diam: 1.70 cm RV S prime:     18.42 cm/s TAPSE (M-mode): 2.0 cm LEFT ATRIUM             Index        RIGHT ATRIUM          Index LA diam:        3.40 cm 1.65 cm/m   RA Area:     9.44 cm LA Vol (A2C):   52.8 ml 25.67 ml/m  RA Volume:   16.40 ml 7.97 ml/m LA Vol (A4C):   66.7 ml 32.43 ml/m LA Biplane Vol: 63.8 ml 31.02 ml/m  AORTIC VALVE AV Area (Vmax):    1.86 cm AV Area (Vmean):   1.86 cm AV Area (VTI):     1.91 cm AV Vmax:           152.50 cm/s AV Vmean:          99.300 cm/s AV VTI:            0.280 m AV Peak Grad:      9.3 mmHg AV Mean Grad:       4.5 mmHg LVOT Vmax:         74.50 cm/s LVOT Vmean:        48.600 cm/s LVOT VTI:          0.141 m LVOT/AV VTI ratio: 0.50  AORTA Ao Root diam: 3.20 cm Ao Asc diam:  3.50 cm  SHUNTS Systemic VTI:  0.14 m Systemic Diam: 2.20 cm Redell Shallow MD Electronically signed by Redell Shallow MD Signature Date/Time: 04/04/2024/1:14:12 PM    Final    CT ANGIO HEAD NECK W WO CM Result Date: 04/04/2024 CLINICAL DATA:  73 year old male with evidence of left MCA territory lacunar infarct on MRI yesterday. Neurologic deficit, seizure. 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 EXAM: CT ANGIOGRAPHY HEAD AND NECK WITH AND WITHOUT CONTRAST TECHNIQUE: Multidetector CT imaging of the head and neck was performed using the standard protocol during bolus administration of intravenous contrast. Multiplanar CT image reconstructions and MIPs were obtained to evaluate the vascular anatomy. Carotid stenosis measurements (when applicable) are obtained utilizing NASCET criteria, using the distal internal carotid diameter as the denominator. RADIATION DOSE REDUCTION: This exam was performed according to the departmental dose-optimization program which includes automated exposure control, adjustment of the mA and/or kV according to patient size and/or use of iterative reconstruction technique. CONTRAST:  75mL OMNIPAQUE  IOHEXOL  350 MG/ML SOLN COMPARISON:  Brain MRI yesterday.  Head CT yesterday. CTA head and neck 07/29/2022. FINDINGS: CT HEAD Brain: Stable non contrast CT appearance of the brain. Chronic small vessel disease including left thalamic lacunar infarct, bilateral white matter hypodensity. Suspected left MCA territory lacune and small left posterior fossa meningioma occult by CT. No acute intracranial hemorrhage or mass effect. Calvarium and skull base: Stable and intact. Paranasal sinuses: Visualized paranasal sinuses and mastoids are stable and well aerated. Orbits: Small left forehead benign scalp lipoma.  No gaze  deviation. CTA NECK Skeleton: Chronic cervical spine degeneration. No acute osseous abnormality identified. Upper chest: Dense left subclavian and innominate venous contrast streak artifact. Otherwise negative. Other neck: Nonvascular neck soft tissue spaces appears stable from last year and within normal limits. Aortic arch: Calcified arch atherosclerosis. Three vessel arch  configuration. Right carotid system: Patent with chronic brachiocephalic artery and right CCA tortuosity. Mild soft and calcified plaque at the proximal right ICA without stenosis. Left carotid system: Patent with tortuosity, mild soft plaque in the left CCA. Minimal plaque at the left carotid bifurcation. No stenosis. Vertebral arteries: Tortuous proximal right subclavian artery without stenosis. Tortuous right vertebral artery origin with perhaps mild soft plaque but no stenosis. Patent right vertebral artery to the skull base without stenosis. Proximal left subclavian artery and left vertebral artery origin appear normal. Possible mild V1 segment plaque. Mildly dominant appearance of the left vertebral artery. No stenosis to the skull base. CTA HEAD Posterior circulation: Patent distal vertebral arteries and vertebrobasilar junction. Chronically dominant appearance of the left V4 segment, although fairly normal caliber. Chronically atherosclerotic and non dominant appearance of the distal right vertebral artery which remains patent. Mild left V4 plaque also. No hemodynamically significant distal vertebral stenosis. Patent basilar artery with mild irregularity. Fetal left PCA origin. Distal basilar and right PCA origin remain patent. Bilateral PCAs are patent, and bilateral PCA irregularity is less pronounced compared to the previous CTA. Up to moderate irregularity now right PCA P1/P2 junction and bilateral P3 segments (series 14, image 21). Anterior circulation: Both ICA siphons are patent. Mildly tortuous left siphon with mild calcified  plaque and no stenosis. Normal left posterior communicating artery origin. Mild tortuosity of the right siphon. Mild to moderate right siphon calcified plaque without stenosis. Patent carotid termini. Patent MCA and ACA origins. Mildly dominant left A1. Diminutive or absent anterior communicating artery. Bilateral ACA branches are patent with increased mild to moderate distal A2 segment irregularity compared to last year (series 13, image 22). MCA M1 segments and MCA bifurcations are patent. Left MCA distal M1, bifurcation, and proximal M2 mild to moderate irregularity appears stable on series 14, image 21. Comparatively mild right MCA bifurcation irregularity. MCA branches appear stable with mild irregularity bilaterally. Venous sinuses: Early contrast timing, not evaluated. Anatomic variants: Mildly dominant left vertebral artery, left ACA A1, fetal type left PCA origin. Review of the MIP images confirms the above findings IMPRESSION: 1. Negative for large vessel occlusion. Chronic extracranial artery tortuosity but little extracranial atherosclerosis. Chronically more advanced Intracranial Atherosclerosis. Notable for: - increased ACA A2 segment irregularity and stenosis, mild-to-moderate. - stable left > right MCA segment irregularity and stenosis, mild-to-moderate. - improved appearance of bilateral PCA irregularity and stenosis, now moderate. 2. Stable non contrast CT appearance of the brain. 3.  Aortic Atherosclerosis (ICD10-I70.0). Electronically Signed   By: VEAR Hurst M.D.   On: 04/04/2024 07:43   MR Brain W and Wo Contrast Result Date: 04/03/2024 EXAM: MRI BRAIN WITH AND WITHOUT CONTRAST 04/03/2024 06:26:19 PM TECHNIQUE: Multiplanar multisequence MRI of the head/brain was performed with and without the administration of 9.2 mL gadobutrol  (GADAVIST ) 1 MMOL/ML intravenous contrast. COMPARISON: CT head without contrast 04/03/2024. MR head without contrast 07/28/2022. CLINICAL HISTORY: Brain/CNS neoplasm,  staging; Prostate CA, new onset sz. Best images due to pt motion. FINDINGS: BRAIN AND VENTRICLES: An acute 7 mm nonhemorrhagic infarct is present within the inferior left lentiform nucleus. The previously noted left thalamic infarct demonstrates expected evolution. Confluent periventricular white matter changes are moderately advanced for age. This most likely reflects the sequelae of chronic microvascular ischemia. Mild white matter changes extend into the brainstem. A 4 mm dural-based lesion extends inferiorly from the left side of the tentorium, most consistent with a meningioma. No other pathologic enhancement is present. No acute intracranial hemorrhage. No mass  effect or midline shift. No hydrocephalus. The sella is unremarkable. Normal flow voids. ORBITS: No acute abnormality. SINUSES: No acute abnormality. BONES AND SOFT TISSUES: Normal bone marrow signal and enhancement. No acute soft tissue abnormality. IMPRESSION: 1. Acute 7 mm nonhemorrhagic infarct within the inferior left lentiform nucleus. 2. 4 mm dural-based lesion extending inferiorly from the left side of the tentorium, most consistent with a meningioma. 3. Moderately advanced confluent periventricular white matter changes, likely sequelae of chronic microvascular ischemia. Critical value findings were called to Dr. Deretha at 6:47 pm Electronically signed by: Lonni Necessary MD 04/03/2024 06:50 PM EDT RP Workstation: [id number]   CT Angio Chest PE W and/or Wo Contrast Result Date: 04/03/2024 CLINICAL DATA:  Positive D-dimer. Seizures. Low to intermediate probability for pulmonary embolism. EXAM: CT ANGIOGRAPHY CHEST WITH CONTRAST TECHNIQUE: Multidetector CT imaging of the chest was performed using the standard protocol during bolus administration of intravenous contrast. Multiplanar CT image reconstructions and MIPs were obtained to evaluate the vascular anatomy. RADIATION DOSE REDUCTION: This exam was performed according to the departmental  dose-optimization program which includes automated exposure control, adjustment of the mA and/or kV according to patient size and/or use of iterative reconstruction technique. CONTRAST:  75mL OMNIPAQUE  IOHEXOL  350 MG/ML SOLN COMPARISON:  Chest CT 02/25/2008 and abdominal CT 10/15/2023 FINDINGS: Cardiovascular: Borderline cardiomegaly. Mild calcified plaque over the left anterior descending coronary artery. Thoracic aorta measures 3.8 cm in AP diameter. Thoracic aorta is otherwise unremarkable. Adequate opacification of the pulmonary arterial system without evidence of pulmonary embolism. Remaining vascular structures are unremarkable. Mediastinum/Nodes: Mediastinum demonstrates no evidence of adenopathy. No hilar adenopathy. Remaining mediastinal structures are unremarkable. Lungs/Pleura: Lungs are adequately inflated. There is significant artifact from metallic device over the posterior right thorax just right of midline. Possible mild opacification over the superior segment right lower lobe which could be due to infection versus atelectasis, although may be due to artifact from the adjacent metallic device over the posterior thorax. Remainder of the right lung is clear. Left lung is clear. Airways are unremarkable. Upper Abdomen: No acute findings over the visualized upper abdominal images. Musculoskeletal: No focal abnormality. Review of the MIP images confirms the above findings. IMPRESSION: 1. No evidence of pulmonary embolism. 2. Possible mild opacification over the superior segment right lower lobe which could be due to infection versus atelectasis, although may be due to artifact from the adjacent metallic device over the posterior thorax. Consider follow-up noncontrast chest CT without metallic device present for further evaluation versus PA and lateral chest radiograph. 3. Borderline cardiomegaly. Mild atherosclerotic coronary artery disease. 4. Aortic atherosclerosis. Aortic Atherosclerosis (ICD10-I70.0).  Electronically Signed   By: Toribio Agreste M.D.   On: 04/03/2024 12:40   DG Chest Port 1 View Result Date: 04/03/2024 CLINICAL DATA:  Shortness of breath EXAM: PORTABLE CHEST 1 VIEW COMPARISON:  02/25/2008 portable chest and chest CTA. FINDINGS: Poor inspiration. Normal-sized heart. Tortuous and partially calcified thoracic aorta. Clear lungs with normal vascularity. Mildly elevated left hemidiaphragm. Mild lower thoracic spine degenerative changes. IMPRESSION: No acute abnormality. Electronically Signed   By: Elspeth Bathe M.D.   On: 04/03/2024 10:02   CT Head Wo Contrast Result Date: 04/03/2024 EXAM: CT HEAD WITHOUT CONTRAST 04/03/2024 09:19:00 AM TECHNIQUE: CT of the head was performed without the administration of intravenous contrast. Automated exposure control, iterative reconstruction, and/or weight based adjustment of the mA/kV was utilized to reduce the radiation dose to as low as reasonably achievable. COMPARISON: CT of the head dated 02/05/2024. CLINICAL HISTORY: Mental status  change, unknown cause. No contrast. BIB EMS hx of stroke with right side deficit. Also being treated for prostate cancer. Wife heard a moan and checked on him and he was hving seizure like activity that last 90sec. No seizure hx. Trauma to tongue no incontinence. Normally ; alert and oriented. Postictal at this time but slowly coming around. 200/130 Runs of bigemny. CBG 178 20g L wrist. FINDINGS: BRAIN AND VENTRICLES: No acute hemorrhage. No evidence of acute infarct. No hydrocephalus. No extra-axial collection. No mass effect or midline shift. There is age-related atrophy and mild-to-moderate periventricular white matter disease. ORBITS: No acute abnormality. SINUSES: No acute abnormality. SOFT TISSUES AND SKULL: No acute soft tissue abnormality. No skull fracture. IMPRESSION: 1. No acute intracranial abnormality. 2. Age-related cerebral atrophy and mild-to-moderate periventricular white matter disease. Electronically signed  by: Evalene Coho MD 04/03/2024 09:32 AM EDT RP Workstation: [id number]    Microbiology: Recent Results (from the past 240 hours)  Blood culture (routine x 2)     Status: None   Collection Time: 04/03/24  3:18 PM   Specimen: BLOOD  Result Value Ref Range Status   Specimen Description BLOOD SITE NOT SPECIFIED  Final   Special Requests   Final    BOTTLES DRAWN AEROBIC AND ANAEROBIC Blood Culture adequate volume   Culture   Final    NO GROWTH 5 DAYS Performed at [facility], [address] [place], [name] [address]    Report Status 04/08/2024 FINAL  Final  Blood culture (routine x 2)     Status: None   Collection Time: 04/03/24  3:25 PM   Specimen: BLOOD  Result Value Ref Range Status   Specimen Description BLOOD SITE NOT SPECIFIED  Final   Special Requests   Final    BOTTLES DRAWN AEROBIC AND ANAEROBIC Blood Culture adequate volume   Culture   Final    NO GROWTH 5 DAYS Performed at [facility], [address], [place], [name] [address]    Report Status 04/08/2024 FINAL  Final  MRSA Next Gen by PCR, Nasal     Status: None   Collection Time: 2024-04-22  6:31 AM   Specimen: Nasal Mucosa; Nasal Swab  Result Value Ref Range Status   MRSA by PCR Next Gen NOT DETECTED NOT DETECTED Final    Comment: (NOTE) The GeneXpert MRSA Assay (FDA approved for NASAL specimens only), is one component of a comprehensive MRSA colonization surveillance program. It is not intended to diagnose MRSA infection nor to guide or monitor treatment for MRSA infections. Test performance is not FDA approved in patients less than 40 years old. Performed at [facility], [address], [place], [name] [address]     Time spent: 40 minutes  Signed: Sammi Fredericks, MD 2024/04/22

## 2024-05-09 NOTE — Progress Notes (Signed)
 patient now scoring yellow muse. BP 147/99, pulse 103, resp. 28 rectal temp 99.5. O2 SAT on room air 92%. Patient very cool to touch with mottled skin. IS receiving LR at 125ml/hr but last output documented was around 3pm. Bladder scanned but only 40cc noted. Abdominal distention. Pale. Rapid response notified. MD Franky notified. Charge nurse in to assess patient. Awaiting new orders. Will continue to monitor.

## 2024-05-09 NOTE — Significant Event (Signed)
 Had a prolonged discussion with wife and brother. They are all in agreement that he would not want to have aggressive interventions and be on life support for long. They would like to make him comfortable. Changed to comfort measures only.

## 2024-05-09 NOTE — Progress Notes (Signed)
 Patient expired at 9:38. Dr Theodoro at bedside and pronounced patient. Patients brother and spouse at bedside as well.[place] notified by [employer].

## 2024-05-09 NOTE — Progress Notes (Signed)
 Routine EEG not needed per Dr. Lindzen after tech arrived to room and wife asked about EEG repeat. Pt had an EEG yesterday afternoon.

## 2024-05-09 NOTE — Progress Notes (Signed)
 Pt Passed. Chaplain Provided emotional and spiritual support to family at bedside.  Rayleen Dade, [place], [facility],  Pager [phone]

## 2024-05-09 NOTE — Significant Event (Addendum)
 Patient's nurse notified me that patient was getting more short of breath and on exam at bedside patient appears confused diaphoretic and follows minimal commands but is alert awake. Blood pressure is 157/105 pulse is 109/min temperature 98.7 respiration 40/min. HEENT anicteric no pallor. Chest bilateral drainage present. Heart S1-S2 heard. Abdomen distended nontender Neuro alert awake appears confused appears generally weak moving all extremities  I reviewed patient's medications labs notes.  Patient admitted for seizures with stroke has become progressively lethargic and confused.  As per the patient's wife with whom I discussed, patient's wife states that patient has hardly had any communication in the last 24 hours with minimal interaction.  On exam at bedside patient still at the same state with minimal interaction.  Given the shortness of breath and abdominal distention we will get CT head CT chest and abdomen pelvis.  Check basic labs including ABG metabolic panel CBC.  Addendum -    patient has become more encephalopathic and less responsive more weaker on the right side discussed with neurologist and they will be seeing patient in consult.  Neurology recommended getting MRI brain.  EEG.  Patient's labs also show worsening renal function with leukocytosis CT chest abdomen pelvis shows ileus without any obstruction.  Patient is becoming more tachypneic less responsive consult critical care.  EKG shows ST-T changes discussed with cardiologist and they feel that patient does not have any criteria for ST elevation MI.  Starting antibiotics empirically transferring patient to ICU.  Discussed with patient's wife patient's wife confirms that patient is a DNR.   Redia Cleaver MD

## 2024-05-09 NOTE — Plan of Care (Signed)

## 2024-05-09 NOTE — Progress Notes (Signed)
 Pharmacy Antibiotic Note  Jerome Cardenas is a 73 y.o. male  with AMS, SOB, AKI and possible sepsis.  Pharmacy has been consulted for Vancomycin and Cefepime  dosing.  Plan: Vancomycin 2000 mg IV now F/U renal function and redose as indicated Cefepime 2 g IV q24h  Height: 5' 8 (172.7 cm) Weight: 92.1 kg (203 lb) IBW/kg (Calculated) : 68.4  Temp (24hrs), Avg:98.5 F (36.9 C), Min:97.6 F (36.4 C), Max:99.5 F (37.5 C)  Recent Labs  Lab 04/03/24 0901 04/04/24 1020 04/05/24 0506 04/07/24 0209 04/07/24 0543 04/08/24 0427 26-Apr-2024 0430  WBC 11.2*  --  9.1 10.3  --  13.3* 26.8*  CREATININE 1.18 0.74 0.91  --  0.99 1.73* 3.06*    Estimated Creatinine Clearance: 23.3 mL/min (A) (by C-G formula based on SCr of 3.06 mg/dL (H)).    Allergies  Allergen Reactions   Pravachol [Pravastatin] Other (See Comments)    Myalgias     Dalyce Renne, Cordella Misty 2024-04-26 6:12 AM

## 2024-05-09 NOTE — Consult Note (Addendum)
 NAME:  Jerome Cardenas, MRN:  [id number], DOB:  13-Oct-1950, LOS: 6 ADMISSION DATE:  04/03/2024, CONSULTATION DATE:  04/23/24 REFERRING MD:  Dr. Franky, CHIEF COMPLAINT:  Unresponsive   History of Present Illness:  73 year old male with past medical history as below, which is significant for prostate cancer with spinal mets on oral therapies, hypertension, hyperlipidemia, diabetes, and stroke.  Presented to ED 8/25 with new onset seizure.  Brain MRI showed acute 7 mm nonhemorrhagic infarct within the inferior left lentiform nucleus and a 4 mm dural based lesion consistent with meningioma.  Outside TNK window and not a candidate for thrombectomy. He was also found to have acute vertebral fractures and colonic ileus.  He was admitted to the hospitalist service. No clear etiology for stroke. Seizure managed with Keppra. He was overall very slow to recover. April 23, 2024 overnight he became much more lethargic and tachypneic. PCCM was consulted.   Pertinent  Medical History   has a past medical history of Cancer (HCC), CKD (chronic kidney disease) stage 2, GFR 60-89 ml/min (07/28/2022), HLD (hyperlipidemia), and Hypertension.   Significant Hospital Events: Including procedures, antibiotic start and stop dates in addition to other pertinent events   9/25 admit with stroke  Interim History / Subjective:    Objective    Blood pressure (!) 142/67, pulse (!) 50, temperature 97.6 F (36.4 C), temperature source Axillary, resp. rate (!) 46, height 5' 8 (1.727 m), weight 92.1 kg, SpO2 94%.        Intake/Output Summary (Last 24 hours) at Apr 23, 2024 9385 Last data filed at 04/23/2024 0100 Gross per 24 hour  Intake 1141.86 ml  Output 320 ml  Net 821.86 ml   Filed Weights   04/03/24 0851  Weight: 92.1 kg    Examination: General: Elderly male in NAD HENT: [place]/AT, PERRL, no JVD Lungs: Clear bilateral breath sounds Cardiovascular: RRR, no MRG Abdomen: Distant, hypoactuve.  Extremities: No acute  deformity Neuro: MInimally responsive. Non-verbal. Not following commands. Eyes open to pain briefly   Resolved problem list   Assessment and Plan   Acute metabolic encephalopathy: etiology uncertain. CT head, chest, abd unrevealing. Probably driven by worsening AKI and acidosis. May be hypovolemic. There is some level of baseline cognitive impairment which has been markedly worse this admission so hard to tell how much of this is acute tonight.  - LR bolus - Trend chemistry - monitor UOP - ? Dialysis candidate - EEG pending  Respiratory distress: CT chest without clear etiology. Likely driven by acidosis - BiPAP to support work of breathing  CVA: MRI from earlier during hospitalization showed acute stroke and meningioma. Not a candidate for TNK or IR.  New onset sz - stroke service has signed off with plans for clinic follow up.  - Continue Keppra. Will transition to IV - ASA/Plavix  3 weeks then plavix  alone.  - Plan for 30 day event monitor  DM, hyperglycemia steroid driven - Check beta hydroxybutyrate, Rule out DKA - Hydrate  AKI Anion gap acidosis - Hydrate - Consider bicarb - Consider nephro, HD?  Colonic Ileus - NPO  Back pain: Acute compression fracture T12, L1, L2, L4. - TLSO brace, decadron, and outpatient kyphoplasty eval.   Prostate Ca - Follows with urology, oncology (Dr. Madison at [facility]). On Xtandi  and leuprolide every 3 months. No obvious metastatic dz noted on Spinal MRI.   HTN/HLD - holding meds while NPO  GOC: - Dr Franky discussed with wife upon transfer to ICU. DNR DNI   Labs  CBC: Recent Labs  Lab 04/03/24 0901 04/05/24 0506 04/07/24 0209 04/08/24 0427 2024-04-13 0430  WBC 11.2* 9.1 10.3 13.3* 26.8*  NEUTROABS  --   --   --   --  24.1*  HGB 15.8 14.6 15.7 19.7* 17.8*  HCT 43.7 40.1 43.5 53.5* 49.0  MCV 85.7 86.1 86.1 84.7 85.2  PLT 164 148* 188 290 344    Basic Metabolic Panel: Recent Labs  Lab 04/03/24 1518  04/04/24 1020 04/05/24 0506 04/07/24 0543 04/08/24 0427 April 13, 2024 0430  NA  --  138 136 132* 131* 132*  K  --  3.1* 4.0 4.0 4.7 5.1  CL  --  111 103 100 98 96*  CO2  --  19* 21* 20* 18* 16*  GLUCOSE  --  174* 193* 184* 248* 367*  BUN  --  8 9 19  35* 74*  CREATININE  --  0.74 0.91 0.99 1.73* 3.06*  CALCIUM   --  7.3* 8.8* 8.9 9.9 9.4  MG 1.7  --   --  1.9 2.3 2.4  PHOS  --   --   --  4.4  --  6.0*   GFR: Estimated Creatinine Clearance: 23.3 mL/min (A) (by C-G formula based on SCr of 3.06 mg/dL (H)). Recent Labs  Lab 04/05/24 0506 04/07/24 0209 04/08/24 0427 2024-04-13 0430  WBC 9.1 10.3 13.3* 26.8*    Liver Function Tests: Recent Labs  Lab 04/13/2024 0430  AST 19  ALT 14  ALKPHOS 90  BILITOT 1.0  PROT 7.1  ALBUMIN 3.6   No results for input(s): LIPASE, AMYLASE in the last 168 hours. Recent Labs  Lab 04/08/24 1848  AMMONIA 38*    ABG    Component Value Date/Time   PHART 7.43 Apr 13, 2024 0520   PCO2ART 26 (L) 2024-04-13 0520   PO2ART 67 (L) 04-13-24 0520   HCO3 17.3 (L) 04-13-2024 0520   TCO2 25 07/28/2022 1255   ACIDBASEDEF 5.4 (H) 04/13/24 0520   O2SAT 94.3 04-13-2024 0520     Coagulation Profile: No results for input(s): INR, PROTIME in the last 168 hours.  Cardiac Enzymes: No results for input(s): CKTOTAL, CKMB, CKMBINDEX, TROPONINI in the last 168 hours.  HbA1C: Hgb A1c MFr Bld  Date/Time Value Ref Range Status  04/03/2024 09:01 AM 6.1 (H) 4.8 - 5.6 % Final    Comment:    (NOTE) Diagnosis of Diabetes The following HbA1c ranges recommended by the American Diabetes Association (ADA) may be used as an aid in the diagnosis of diabetes mellitus.  Hemoglobin             Suggested A1C NGSP%              Diagnosis  <5.7                   Non Diabetic  5.7-6.4                Pre-Diabetic  >6.4                   Diabetic  <7.0                   Glycemic control for                       adults with diabetes.    07/29/2022  02:35 AM 6.6 (H) 4.8 - 5.6 % Final    Comment:    (NOTE) Pre diabetes:  5.7%-6.4%  Diabetes:              >6.4%  Glycemic control for   <7.0% adults with diabetes     CBG: Recent Labs  Lab 04/08/24 0607 04/08/24 1221 04/08/24 1601 04/08/24 2132 2024-04-17 0112  GLUCAP 263* 295* 349* 350* 397*    Review of Systems:   Patient is encephalopathic and/or intubated; therefore, history has been obtained from chart review.    Past Medical History:  He,  has a past medical history of Cancer (HCC), CKD (chronic kidney disease) stage 2, GFR 60-89 ml/min (07/28/2022), HLD (hyperlipidemia), and Hypertension.   Surgical History:   Past Surgical History:  Procedure Laterality Date   APPENDECTOMY     CHOLECYSTECTOMY     HERNIA REPAIR     PROSTATE SURGERY     prostate cancer 2014     Social History:   reports that he has never smoked. He has never used smokeless tobacco. He reports that he does not drink alcohol and does not use drugs.   Family History:  His family history is negative for Stroke.   Allergies Allergies  Allergen Reactions   Pravachol [Pravastatin] Other (See Comments)    Myalgias      Home Medications  Prior to Admission medications   Medication Sig Start Date End Date Taking? Authorizing Provider  APPLE CIDER VINEGAR PO Take 1 tablet by mouth daily.   Yes [provider]  Ascorbic Acid (VITAMIN C) 500 MG CHEW Chew 500 mg by mouth daily.   Yes [provider]  calcium  carbonate (SUPER CALCIUM ) 1500 (600 Ca) MG TABS tablet Take 1 tablet by mouth daily. 04/27/21  Yes [provider]  Cholecalciferol  (VITAMIN D -3 PO) Take 1 capsule by mouth daily.   Yes [provider]  Cyanocobalamin  (VITAMIN B-12 PO) Take 1 tablet by mouth daily.   Yes [provider]  DULoxetine  (CYMBALTA ) 60 MG capsule Take 60 mg by mouth daily. 02/18/24  Yes [provider]  ELDERBERRY PO Take 1 capsule by mouth daily.   Yes  [provider]  enzalutamide  (XTANDI ) 40 MG capsule Take 160 mg by mouth every evening.   Yes [provider]  icosapent Ethyl (VASCEPA) 1 g capsule Take 2 g by mouth 2 (two) times daily.   Yes [provider]  Magnesium 400 MG TABS Take 400 mg by mouth daily.   Yes [provider]  metFORMIN  (GLUCOPHAGE ) 500 MG tablet Take 500 mg by mouth daily with breakfast.   Yes [provider]  metoprolol  succinate (TOPROL -XL) 50 MG 24 hr tablet Take 50 mg by mouth daily. Take with or immediately following a meal.   Yes [provider]  Multiple Vitamin (MULTIVITAMIN WITH MINERALS) TABS tablet Take 1 tablet by mouth daily.   Yes [provider]  omeprazole (PRILOSEC) 20 MG capsule Take 20 mg by mouth daily.   Yes [provider]  rosuvastatin  (CRESTOR ) 20 MG tablet Take 1 tablet (20 mg total) by mouth daily. 07/29/22  Yes Paige, Victoria J, DO  spironolactone  (ALDACTONE ) 50 MG tablet Take 50 mg by mouth daily.   Yes [provider]  amLODipine  (NORVASC ) 10 MG tablet Take 10 mg by mouth daily. Patient not taking: Reported on 04/05/2024    [provider]  aspirin  EC 81 MG tablet Take 1 tablet (81 mg total) by mouth daily. Swallow whole. Patient not taking: Reported on 04/05/2024 07/29/22   Paige, Victoria J, DO  bacitracin  ointment  Apply 1 Application topically 2 (two) times daily. Please apply to the right cheek skin tear. Patient not taking: Reported on 04/04/2024 02/05/24   Lang Norleen POUR, PA-C  fenofibrate  (TRICOR ) 48 MG tablet Take 48 mg by mouth daily. Patient not taking: Reported on 04/05/2024 11/01/21   [provider]  HYDROcodone -acetaminophen  (NORCO/VICODIN) 5-325 MG tablet Take 1 tablet by mouth every 4 (four) hours as needed. Patient not taking: Reported on 04/04/2024 02/05/24   Lang Norleen POUR, PA-C  olmesartan (BENICAR) 40 MG tablet Take 40 mg by mouth daily. Patient not taking: Reported on 04/05/2024     [provider]  topiramate  (TOPAMAX ) 25 MG tablet Take 1 tablet (25 mg total) by mouth 2 (two) times daily. Patient not taking: Reported on 04/04/2024 03/26/23 03/25/24  Sherryl Bouchard, NP     Critical care time:  60 min      Deward Eastern, AGACNP-BC [facility]  See Amion for personal pager PCCM on call pager [phone] until 7pm. Please call Elink 7p-7a. [phone]  04/29/24 7:20 AM

## 2024-05-09 NOTE — Telephone Encounter (Signed)
 Sending out condolence card to address on file.

## 2024-05-09 NOTE — Progress Notes (Addendum)
 PROGRESS NOTE    Jerome Cardenas  FMW:[id number] DOB: 08-18-1950 DOA: 04/03/2024 PCP: Evangelina Tinnie Norris, PA-C  Chief Complaint  Patient presents with   Seizures    Brief Narrative:   73 year old male with history of hypertension, hyperlipidemia, recurrent stroke, prostate cancer with spinal mets, type 2 diabetes , CVA who was brought to the to the emergency department for new onset seizure.  His wife found him on the floor and witnessed her seizure.  He was postictal afterwards and also had weakness.  On presentation, he was hemodynamically stable.  Lab work showed potassium of 3.1, troponin of 146, D-dimer 3.9.  CTA did not show any PE but also showed  possible mild opacification over the superior segment right lowerlobe which could be due to infection versus atelectasis, although may be due to artifact .  CT head did not show any acute findings.  MRI of the brain showed acute 7 mm nonhemorrhagic infarct within the inferior left lentiform nucleus, 4 mm dural-based lesion extending inferiorly from the left side of the tentorium, most consistent with Mixtli Reno meningioma.  Neurology consulted , stroke workup completed.  PT recommend home health discharge.  Unfortunately due to persistent abdominal pain and back pain additional workup revealed colonic ileus and multiple areas of compression fracture requiring conservative management as mentioned below.   Transferred to ICU overnight for worsening resp distress, encephalopathy.  Assessment & Plan:   Principal Problem:   Seizures (HCC) Active Problems:   Acute CVA (cerebrovascular accident) (HCC)   AKI (acute kidney injury)   Acute metabolic encephalopathy  Goals of Care Confirmed with wife, DNR/DNI.  Discussed critically ill status, will see how he does with bipap/supportive care.  If he doesn't turn around, will need to consider transition to comfort measures.   Addendum 5:30 PM he passed in ICU shortly after I saw him this AM.  Called  this PM to offer condolences.  She was wondering if anything had resulted that may shed light on his decline (cultures specifically) - discussed those hadn't resulted yet, take Tiny Rietz few days.  Expressed appreciation for his care, also uncertainty regarding primary cause of his decline.    Addendum 10/7 5:12 PM Wife had asked about culture results on the day of his death.  Followed these up with her today.  Suspect staph haemoltyicus contaminant.    Acute Kidney Injury In setting of poor PO intake, follow with IVF UA without protein or RBC No hydro on CT Follow with continued IVF Strict I/O, daily weights  Acute Hypoxic Respiratory Failure Now on bipap, tachypneic in the 40's Confirmed DNI with wife CT with mild atelectasis, but no focal consolidation or edema  Related to metabolic acidosis +/- ileus/abdominal distension Supportive care for now, appreciate critical care assistance  Acute Metabolic Encephalopathy Cognitive impairment Chronic cognitive impairment noted over past year, family suspects dementia, progressively worsened during hospitalization within past few days MRI from earlier during hospitalization showed acute stroke and meningioma  Delirium precautions ABG this AM without hypercarbia, ammonia (mildly elevated, will repeat), b12 elevated Pending TSH wnl, b1 (pending), folate Repeat EEG with mild to moderate diffuse encephalopathy Repeat CT head without acute abnormality Seen by neurology last night, note pending MRI brain was ordered, not clinically stable for this at this time Suspect related to acute hospitalization, ileus, stroke, seizure, compression fractures and pain - possible sepsis with elevated WBC count, renal failure, resp failure Will work up additionally as needed  Systemic Inflammatory Response Syndrome Concern for Sepsis  Tachypneic, tachycardic, leukocytosis (in setting of steroids) Follow blood cultures Broad spectrum abx  CT without findings to  explain leukocytosis  UA bland   Abdominal distention ileus Constipation No acute intraabdominal process on CT 9/29, but mild diffuse air distension of colon - no obstruction Continue bowel regimen when able  CT April 20, 2024 with findings c/w ileus without mechanical obstruction  Seizure, new onset - unclear etiology, related to recent stroke, fall?  Appreciate neurology assistance - keppra 500 mg BID -EEG as below .  Continue Keppra on discharge.     Per [place] statutes, patients with seizures are not allowed to drive until  they have been seizure-free for six months. Use caution when using heavy equipment or power tools. Avoid working on ladders or at heights. Take showers instead of baths. Ensure the water temperature is not too high on the home water heater. Do not go swimming alone. When caring for infants or small children, sit down when holding, feeding, or changing them to minimize risk of injury to the child in the event you have Agustina Witzke seizure. Also, Maintain good sleep hygiene. Avoid alcohol.  Acute CVA  MRI showed cute 7 mm nonhemorrhagic infarct within the inferior left lentiform nucleus.    CTA head/neck without LVO, chronic extracranial artery tortuosity, but little extracranial atheroscerlosis, chronically more advanced intracranial atheroscerlosis notable for increased ACA A2 segment irregularity and stenosis, mild to moderate.  Stable L>R mca segment irregularity and stenosis mild to moderate.  Improved appearance of bilateral PCA irregularity and stenosis.  A1c of 6.1, LDL of 108.   Currently on aspirin  and Plavix .  Plan is to continue DAPT for 3 weeks followed by Plavix  alone .  Echo showed EF of 60 to 65%, no intracardiac source of emboli.  30 day event monitor arranged by Cards.    Back pain - Acute compression fracture of T12, L1, L2 and L4.  Some disc protrusion around L2 affecting nerve roots.   Discussed with neurosurgery PA Camie.  Agree with pain management, TLSO  brace, Decadron and outpatient follow-up for kyphoplasty evaluation - holding decadron for now   Steroid Induced Hyperglycemia A1c 6.1 Steroids currently on hold Currently on insulin gtt, per pccm   Hypertension, uncontrolled Takes amlodipine , spironolactone , olmesartan, metoprolol  at home.  This can slowly be normalized over the next week   History of prostate cancer with spinal mets/Back pain  Follows with urology, oncology(Dr. Madison at [facility]).  On Xtandi  and leuprolide every 3 months. No obvious metastatic dz noted on Spinal MRI.    Suspected pneumonia  Chest x-ray was clear.  But CT also showed  possible mild opacification over the superior segment right lowerlobe, likely artifact.  Antibiotics discontinued   GERD  Continue PPI   Hypokalemia  Supplemented and corrected   History of hyperlipidemia  Takes fenofibrate , Crestor    History of BPH  Takes finasteride, Flomax    Elevated troponin: Flat trend.  No chest pain.   Likely demand ischemia. Echo did not show any regional wall motion abnormalities Abnormal EKG 04/20/24, discussed with cards who did not think any findings c/w ST elevation MI   Depression  Anxiety Wife requesting d/c cymbalta , will taper    DVT prophylaxis: lovenox  Code Status: full Family Communication: wife Disposition:   Status is: Inpatient Remains inpatient appropriate because: need for continued inpatient care   Consultants:  neurology  Procedures:  EEG IMPRESSION: This study is suggestive of mild to moderate diffuse encephalopathy. No seizures or epileptiform discharges  were seen throughout the recording.  EEG Impression: This EEG was obtained while awake and asleep and is normal.   Echo MPRESSIONS     1. Left ventricular ejection fraction, by estimation, is 60 to 65%. The  left ventricle has normal function. The left ventricle has no regional  wall motion abnormalities. Left ventricular diastolic parameters are  consistent with  Grade I diastolic  dysfunction (impaired relaxation).   2. Right ventricular systolic function is normal. The right ventricular  size is normal.   3. The mitral valve is normal in structure. No evidence of mitral valve  regurgitation. No evidence of mitral stenosis.   4. The aortic valve is tricuspid. Aortic valve regurgitation is trivial.  No aortic stenosis is present.   5. The inferior vena cava is normal in size with greater than 50%  respiratory variability, suggesting right atrial pressure of 3 mmHg.    Antimicrobials:  Anti-infectives (From admission, onward)    Start     Dose/Rate Route Frequency Ordered Stop   04/26/24 0845  metroNIDAZOLE (FLAGYL) IVPB 500 mg        500 mg 100 mL/hr over 60 Minutes Intravenous Every 8 hours 2024-04-26 0757     04-26-2024 0715  vancomycin (VANCOREADY) IVPB 2000 mg/400 mL        2,000 mg 200 mL/hr over 120 Minutes Intravenous  Once 04-26-2024 0618     Apr 26, 2024 0645  ceFEPIme (MAXIPIME) 2 g in sodium chloride  0.9 % 100 mL IVPB        2 g 200 mL/hr over 30 Minutes Intravenous Daily 2024-04-26 0618     04/03/24 1330  cefTRIAXone  (ROCEPHIN ) 1 g in sodium chloride  0.9 % 100 mL IVPB        1 g 200 mL/hr over 30 Minutes Intravenous  Once 04/03/24 1324 04/03/24 1441   04/03/24 1330  azithromycin  (ZITHROMAX ) 500 mg in sodium chloride  0.9 % 250 mL IVPB        500 mg 250 mL/hr over 60 Minutes Intravenous  Once 04/03/24 1324 04/03/24 1508       Subjective: Unresponsive on bipap Discussion with wife, confirming DNR/DNI based on prior paperwork/conversations   Objective: Vitals:   04-26-2024 0745 04/26/2024 0800 2024-04-26 0815 April 26, 2024 0830  BP: (!) 187/14 (!) 130/12 (!) 154/11 (!) 161/15  Pulse:  (!) 103 99 (!) 102  Resp: (!) 44 (!) 52 (!) 40 (!) 45  Temp:      TempSrc:      SpO2:  93% 95% 95%  Weight:      Height:        Intake/Output Summary (Last 24 hours) at 04-26-2024 0838 Last data filed at Apr 26, 2024 0800 Gross per 24 hour  Intake 2276.19 ml   Output 320 ml  Net 1956.19 ml   Filed Weights   04/03/24 0851  Weight: 92.1 kg    Examination:  General: critically ill appearing Cardiovascular: tachycardic Lungs: tachypneic on bipap Abdomen: distended, tight Neurological: unresponsive on bipap Extremities: No clubbing or cyanosis. No edema.  Data Reviewed: I have personally reviewed following labs and imaging studies  CBC: Recent Labs  Lab 04/03/24 0901 04/05/24 0506 04/07/24 0209 04/08/24 0427 2024/04/26 0430  WBC 11.2* 9.1 10.3 13.3* 26.8*  NEUTROABS  --   --   --   --  24.1*  HGB 15.8 14.6 15.7 19.7* 17.8*  HCT 43.7 40.1 43.5 53.5* 49.0  MCV 85.7 86.1 86.1 84.7 85.2  PLT 164 148* 188 290 344    Basic  Metabolic Panel: Recent Labs  Lab 04/03/24 1518 04/04/24 1020 04/05/24 0506 04/07/24 0543 04/08/24 0427 2024-04-13 0430  NA  --  138 136 132* 131* 132*  K  --  3.1* 4.0 4.0 4.7 5.1  CL  --  111 103 100 98 96*  CO2  --  19* 21* 20* 18* 16*  GLUCOSE  --  174* 193* 184* 248* 367*  BUN  --  8 9 19  35* 74*  CREATININE  --  0.74 0.91 0.99 1.73* 3.06*  CALCIUM   --  7.3* 8.8* 8.9 9.9 9.4  MG 1.7  --   --  1.9 2.3 2.4  PHOS  --   --   --  4.4  --  6.0*    GFR: Estimated Creatinine Clearance: 23.3 mL/min (Tate Jerkins) (by C-G formula based on SCr of 3.06 mg/dL (H)).  Liver Function Tests: Recent Labs  Lab 04/13/24 0430  AST 19  ALT 14  ALKPHOS 90  BILITOT 1.0  PROT 7.1  ALBUMIN 3.6    CBG: Recent Labs  Lab 04/08/24 1221 04/08/24 1601 04/08/24 2132 04-13-2024 0112 04/13/24 0828  GLUCAP 295* 349* 350* 397* 357*     Recent Results (from the past 240 hours)  Blood culture (routine x 2)     Status: None   Collection Time: 04/03/24  3:18 PM   Specimen: BLOOD  Result Value Ref Range Status   Specimen Description BLOOD SITE NOT SPECIFIED  Final   Special Requests   Final    BOTTLES DRAWN AEROBIC AND ANAEROBIC Blood Culture adequate volume   Culture   Final    NO GROWTH 5 DAYS Performed at [facility], [address], [place], [name] [address]    Report Status 04/08/2024 FINAL  Final  Blood culture (routine x 2)     Status: None   Collection Time: 04/03/24  3:25 PM   Specimen: BLOOD  Result Value Ref Range Status   Specimen Description BLOOD SITE NOT SPECIFIED  Final   Special Requests   Final    BOTTLES DRAWN AEROBIC AND ANAEROBIC Blood Culture adequate volume   Culture   Final    NO GROWTH 5 DAYS Performed at [facility], [address] [place], [name] [address]    Report Status 04/08/2024 FINAL  Final         Radiology Studies: CT HEAD WO CONTRAST ( ) Result Date: 04-13-2024 EXAM: CT HEAD WITHOUT CONTRAST 04-13-24 04:49:32 AM TECHNIQUE: CT of the head was performed without the administration of intravenous contrast. Automated exposure control, iterative reconstruction, and/or weight based adjustment of the mA/kV was utilized to reduce the radiation dose to as low as reasonably achievable. COMPARISON: CT of the head dated 04/04/2024. CLINICAL HISTORY: Meningitis/CNS infection suspected. Patient now scoring yellow MUSE. BP 147/99, pulse 103, resp. 28, rectal temp 99.5. O2 SAT on room air 92%. Patient very cool to touch with mottled skin. Is receiving LR at 125 ml/hr but last output documented was 250 ml around 3 pm. Bladder scanned but only 40 cc noted. Abdominal distention. Pale. FINDINGS: BRAIN AND VENTRICLES: No acute hemorrhage. No evidence of acute infarct. No hydrocephalus. No extra-axial collection. No mass effect or midline shift. Age-related atrophy. Moderate periventricular white matter disease. Chronic lacunar infarct present within the left thalamus. ORBITS: No acute abnormality. SINUSES: No acute abnormality. SOFT TISSUES AND SKULL: No acute soft tissue abnormality. No skull fracture. IMPRESSION: 1. No acute intracranial abnormality. 2. Age-related cerebral atrophy and moderate chronic  microvascular ischemic changes. 3. Chronic lacunar infarct in the left  thalamus. Electronically signed by: Evalene Coho MD 04-28-24 05:35 AM EDT RP Workstation: [id number]   CT CHEST ABDOMEN PELVIS WO CONTRAST Result Date: April 28, 2024 EXAM: CT CHEST, ABDOMEN AND PELVIS WITHOUT CONTRAST 04/28/24 04:49:32 AM TECHNIQUE: CT of the chest, abdomen and pelvis was performed without the administration of intravenous contrast. Multiplanar reformatted images are provided for review. Automated exposure control, iterative reconstruction, and/or weight based adjustment of the mA/kV was utilized to reduce the radiation dose to as low as reasonably achievable. COMPARISON: CT of the chest dated 04/03/2024 and CT of the abdomen and pelvis dated 04/06/2024. CLINICAL HISTORY: Dyspnea, chronic, unclear etiology. patient now scoring yellow muse. BP 147/99, pulse 103, resp. 28 rectal temp 99.5. O2 SAT on room air 92%. Patient very cool to touch with mottled skin. IS receiving LR at 125ml/hr but last output documented was around 3pm. Bladder scanned but only ; 40cc noted. Abdominal distention. Pale. FINDINGS: CHEST: MEDIASTINUM AND LYMPH NODES: Heart demonstrates mild calcific coronary artery disease. Pericardium is unremarkable. The central airways are clear. No mediastinal, hilar or axillary lymphadenopathy. There is mild calcific plaque within the aortic arch. There is fluid present within the thoracic esophagus, which is mildly distended. LUNGS AND PLEURA: There is mild atelectasis present independently within the lower lobes bilaterally. No focal consolidation or pulmonary edema. No pleural effusion or pneumothorax. ABDOMEN AND PELVIS: LIVER: The liver is unremarkable. GALLBLADDER AND BILE DUCTS: Patient is status post cholecystectomy. No biliary ductal dilatation. SPLEEN: No acute abnormality. PANCREAS: No acute abnormality. ADRENAL GLANDS: No acute abnormality. KIDNEYS, URETERS AND BLADDER: Punctate nonobstructive renal calculi present bilaterally. No hydronephrosis. No perinephric or  periureteral stranding. Urinary bladder is unremarkable. GI AND BOWEL: The stomach is moderately distended with air and fluid. Most of the small bowel is distended with fluid. The distal ileum is decompressed. Lead point evident to suggest obstruction. There is radiopaque material within the right colon. Findings suggest ileus. REPRODUCTIVE ORGANS: There are numerous prostate seed implants present. PERITONEUM AND RETROPERITONEUM: No ascites. No free air. VASCULATURE: The abdominal aorta demonstrates mild calcific atheromatous disease. ABDOMINAL AND PELVIS LYMPH NODES: No lymphadenopathy. BONES AND SOFT TISSUES: No acute osseous abnormality. No focal soft tissue abnormality. IMPRESSION: 1. Findings most consistent with ileus without evidence of mechanical obstruction 2. Mild coronary and aortic atherosclerosis 3. Mild bilateral lower lobe atelectasis Electronically signed by: Evalene Coho MD 04/28/2024 05:34 AM EDT RP Workstation: [id number]   EEG adult Result Date: 04/08/2024 Shelton Arlin KIDD, MD     04/08/2024  3:43 PM Patient Name: KHALEL ALMS MRN: [id number] Epilepsy Attending: Arlin KIDD Shelton Referring Physician/Provider: Perri DELENA Meliton Mickey., MD Date: 04/08/2024 Duration: 24.27 mins Patient history: 73 y.o. male who was brought in by EMS after Valta Dillon first-time seizure at home. EEG to evaluate for seizure Level of alertness: Awake AEDs during EEG study: LEV Technical aspects: This EEG study was done with scalp electrodes positioned according to the 10-20 International system of electrode placement. Electrical activity was reviewed with band pass filter of 1-70Hz , sensitivity of 7 uV/mm, display speed of 75mm/sec with [employer] 60Hz  notched filter applied as appropriate. EEG data were recorded continuously and digitally stored.  Video monitoring was available and reviewed as appropriate. Description: The posterior dominant rhythm consists of 8 Hz activity of moderate voltage (25-35 uV) seen predominantly in  posterior head regions, symmetric and reactive to eye opening and eye closing. EEG showed intermittent generalized 3 to 6 Hz theta-delta  slowing. Hyperventilation and photic stimulation were not performed.   ABNORMALITY - Intermittent slow, generalized IMPRESSION: This study is suggestive of mild to moderate diffuse encephalopathy. No seizures or epileptiform discharges were seen throughout the recording. Priyanka O Yadav        Scheduled Meds:  acetaminophen   1,000 mg Oral TID   amLODipine   10 mg Oral Daily   aspirin  EC  81 mg Oral Daily   bisacodyl  10 mg Oral Q1500   calcitonin (salmon)  1 spray Alternating Nares Daily   Chlorhexidine Gluconate Cloth  6 each Topical Daily   clopidogrel   75 mg Oral Daily   DULoxetine   40 mg Oral Daily   Followed by   NOREEN ON 04/16/2024] DULoxetine   30 mg Oral Daily   Followed by   NOREEN ON 04/23/2024] DULoxetine   20 mg Oral Daily   Followed by   NOREEN ON 04/30/2024] DULoxetine   20 mg Oral QODAY   enoxaparin  (LOVENOX ) injection  40 mg Subcutaneous Q24H   fenofibrate   54 mg Oral Daily   irbesartan   300 mg Oral Daily   levETIRAcetam  500 mg Intravenous Q12H   metoprolol  succinate  75 mg Oral Daily   pantoprazole   40 mg Oral Daily   polyethylene glycol  17 g Oral BID   rosuvastatin   40 mg Oral Daily   topiramate   25 mg Oral BID   Continuous Infusions:  ceFEPime (MAXIPIME) IV 200 mL/hr at 05/01/24 0800   dextrose 5% lactated ringers     insulin     lactated ringers     metronidazole     vancomycin       LOS: 6 days    Time spent: over 30 min     Meliton Monte, MD [facility]   To contact the attending provider between 7A-7P or the covering provider during after hours 7P-7A, please log into the web site [email or web address] and access using universal [place] password for that web site. If you do not have the password, please call the hospital operator.  01-May-2024, 8:38 AM

## 2024-05-09 NOTE — Significant Event (Signed)
 Rapid Response Event Note   Reason for Call : Decreased LOC and tachypnea  Initial Focused Assessment:  I was notified by Dr. Franky regarding Mr. Schirmer's decreased LOC and tachypnea. Mr. Memoli is arousable, nonverbal. Moves extremities but profoundly weak. BBS clear, diminished. Abd distended and tympanic. Skin cool, pale and mottled. Poor urine output.   0600-43F, HR 110 ST, 142/85 (94), RR 42 with sats 96% on 5L [place]   Interventions:  -tx to ICU      Event Summary:   MD Notified: Dr. Franky and PCCM at bedside.  Call Time: 0546 Arrival Time: 9447 End Time: 0700  Griselda Alm ORN, RN

## 2024-05-09 NOTE — TOC Progression Note (Signed)
 Transition of Care [facility]) - Progression Note    Patient Details  Name: Jerome Cardenas MRN: [id number] Date of Birth: 1950-12-02  [facility] CM/SW Contact  Lauraine FORBES Saa, LCSWA Phone Number: 05-01-24, 11:41 AM  Clinical Narrative:     11:41 AM Per progressions, patient transitioned to comfort care and expired this morning.  Expected Discharge Plan: Expired Barriers to Discharge: Continued Medical Work up               Expected Discharge Plan and Services   Discharge Planning Services: CM Consult Post Acute Care Choice: Home Health Living arrangements for the past 2 months: Single Family Home                           HH Arranged: PT, OT HH Agency: [facility] Date [facility] Agency Contacted: 04/06/24   Representative spoke with at [facility] Agency: Darleene   Social Drivers of Health (SDOH) Interventions SDOH Screenings   Food Insecurity: Low Risk  (08/16/2023)   Received from [facility]  Housing: Low Risk  (10/11/2023)   Received from [facility]  Transportation Needs: No Transportation Needs (08/16/2023)   Received from [facility]  Utilities: Low Risk  (08/16/2023)   Received from [facility]  Financial Resource Strain: Low Risk  (05/02/2022)   Received from [facility] visits prior to 09/08/2022., [facility]  Physical Activity: Unknown (05/02/2022)   Received from [facility] visits prior to 09/08/2022., [facility]  Social Connections: Unknown (05/02/2022)   Received from [facility], [facility] visits prior to 09/08/2022.  Stress: Stress Concern Present (05/02/2022)   Received from [facility], [facility] visits prior to 09/08/2022.  Tobacco Use: Low Risk  (04/03/2024)    Readmission Risk Interventions     No data to display

## 2024-05-09 NOTE — Progress Notes (Signed)
 NEUROLOGY CONSULT FOLLOW UP NOTE   Date of service: 05/07/2024 Patient Name: Jerome Cardenas MRN:  [id number] DOB:  [birth year]  Interval Hx/subjective   Patient last seen by Stroke service on 04/05/24. At that time was able to walk with OT & PT and was responsive.   On 05-07-2024 morning, patient was noted to have tachycardia, tachypnea and increased work of breathing. He was also difficult to arouse and did not seem to move his right side. Rapid response called and patient went for [facility] and CT abdomen. CTH was unremarkable. CT chest abdomen pelvis showed ileus without signs of obstruction. Neurology was consulted for possible seizure as cause for encephalopathy.    Physical Exam   Constitutional: Lying in bed, unresponsive Eyes: No scleral injection.  HENT: No OP obstrucion.  Head: Normocephalic.  Cardiovascular: Increased rate Respiratory: Increased rate, abdominal breathing GI: Very distended but not rigid. Skin: WDI.   Neurologic Examination   Mental status: Does not open eyes to voice or sternal rub. Cranial nerves: PERRL (2->12mm, sluggish), corneals intact Motor:  Initially no movement to noxious in any limb. Then observed to have intermittent spontaneous movement, including of RUE. Sensory: No movement to pinching or nailbed.   Labs and Diagnostic Imaging   CBC:  Recent Labs  Lab 04/08/24 0427 07-May-2024 0430  WBC 13.3* 26.8*  NEUTROABS  --  24.1*  HGB 19.7* 17.8*  HCT 53.5* 49.0  MCV 84.7 85.2  PLT 290 344    Basic Metabolic Panel:  Lab Results  Component Value Date   NA 132 (L) 07-May-2024   K 5.1 05/07/2024   CO2 14 (L) May 07, 2024   GLUCOSE 367 (H) 05/07/2024   BUN 76 (H) 05/07/24   CREATININE 3.46 (H) 05-07-24   CALCIUM  8.9 May 07, 2024   GFRNONAA 18 (L) 2024/05/07   CT Head without contrast 05/07/2024 (Personally reviewed): No acute abnormality. Frontoparietal atrophy. Hypodensity in L thalamus.   MRI Brain w/wo 04/03/24 (Personally  reviewed): Acute L BG infarct. Chronic L thalamic infarct. 4mm dural-based lesion coming off of L tentorium consistent with meningioma.   rEEG 04/04/24:  No seizure, epileptiform discharges or focal slowing.   rEEG 04/08/24:  Intermittent generalized slow. No seizure or epileptiform discharges.   Assessment   Jerome Cardenas is a 73 y.o. male with PMH of recurrent prostate cancer, HTN, HLD, DM2 who is admitted for first-time seizure witnessed by wife, found to have acute punctate L BG & L parieto-occipital infarcts (in addition to chronic L thalamic). Interestingly no cortical infarct to act as source for seizure, however patient does have atrophy which could serve as seizure nidus.   As for his new worsening of right hemiplegia, at baseline it appears patient has mild right-sided weakness, but was at least antigravity in arms and able to walk. Patient has had drastic change to being unresponsive and not withdrawing even to nailbed. This exam finding is bilateral though. However primary team has noted inability to move right side, therefore it is reasonable to re-image with MRI.   Lastly, patient has new acute renal failure (Cr 1.73 -> 3.06) overnight, multiple electrolyte abnormalities, leukocytosis (WBC 13.3 -> 26.8) overnight, tachypnea, tachycardia. All of these can contribute to encephalopathy and also stroke recrudescence.   Recommendations  Agree with MRI brain & EEG Agree with further investigation and correction of toxic metabolic causes ______________________________________________________________________   Signed, Kinslei Labine M Karcyn Menn, MD [facility]

## 2024-05-09 DEATH — deceased

## 2024-06-01 ENCOUNTER — Ambulatory Visit: Admitting: Cardiology

## 2024-07-20 ENCOUNTER — Ambulatory Visit: Admitting: Adult Health

## 2025-03-01 ENCOUNTER — Encounter (INDEPENDENT_AMBULATORY_CARE_PROVIDER_SITE_OTHER): Admitting: Ophthalmology
